# Patient Record
Sex: Female | Born: 1955 | Race: White | Hispanic: No | Marital: Married | Smoking: Never smoker
Health system: Southern US, Community
[De-identification: ages and names within clinical notes are randomized; demographics above are authoritative.]

## PROBLEM LIST (undated history)

## (undated) DIAGNOSIS — Z8719 Personal history of other diseases of the digestive system: Secondary | ICD-10-CM

## (undated) DIAGNOSIS — E119 Type 2 diabetes mellitus without complications: Secondary | ICD-10-CM

## (undated) DIAGNOSIS — G609 Hereditary and idiopathic neuropathy, unspecified: Secondary | ICD-10-CM

## (undated) DIAGNOSIS — I10 Essential (primary) hypertension: Secondary | ICD-10-CM

## (undated) DIAGNOSIS — D329 Benign neoplasm of meninges, unspecified: Secondary | ICD-10-CM

## (undated) DIAGNOSIS — F419 Anxiety disorder, unspecified: Secondary | ICD-10-CM

## (undated) DIAGNOSIS — I447 Left bundle-branch block, unspecified: Secondary | ICD-10-CM

## (undated) DIAGNOSIS — K219 Gastro-esophageal reflux disease without esophagitis: Secondary | ICD-10-CM

## (undated) DIAGNOSIS — C801 Malignant (primary) neoplasm, unspecified: Secondary | ICD-10-CM

## (undated) DIAGNOSIS — M1990 Unspecified osteoarthritis, unspecified site: Secondary | ICD-10-CM

## (undated) HISTORY — DX: Type 2 diabetes mellitus without complications: E11.9

## (undated) HISTORY — PX: ABDOMINAL SURGERY: SHX537

## (undated) HISTORY — PX: ABDOMINAL HYSTERECTOMY: SHX81

## (undated) HISTORY — PX: APPENDECTOMY: SHX54

## (undated) HISTORY — DX: Hereditary and idiopathic neuropathy, unspecified: G60.9

## (undated) HISTORY — DX: Essential (primary) hypertension: I10

---

## 2004-12-12 HISTORY — PX: KNEE SURGERY: SHX244

## 2006-12-12 HISTORY — PX: INGUINAL HERNIA REPAIR: SUR1180

## 2014-06-16 ENCOUNTER — Ambulatory Visit: Payer: Managed Care, Other (non HMO) | Attending: Gynecologic Oncology | Admitting: Gynecologic Oncology

## 2014-06-16 ENCOUNTER — Ambulatory Visit: Payer: Managed Care, Other (non HMO)

## 2014-06-16 ENCOUNTER — Encounter: Payer: Self-pay | Admitting: Gynecologic Oncology

## 2014-06-16 ENCOUNTER — Telehealth: Payer: Self-pay | Admitting: *Deleted

## 2014-06-16 VITALS — BP 119/67 | HR 127 | Temp 98.0°F | Resp 20 | Ht 63.0 in | Wt 156.6 lb

## 2014-06-16 DIAGNOSIS — R971 Elevated cancer antigen 125 [CA 125]: Secondary | ICD-10-CM

## 2014-06-16 DIAGNOSIS — E119 Type 2 diabetes mellitus without complications: Secondary | ICD-10-CM | POA: Insufficient documentation

## 2014-06-16 DIAGNOSIS — R1900 Intra-abdominal and pelvic swelling, mass and lump, unspecified site: Secondary | ICD-10-CM

## 2014-06-16 DIAGNOSIS — I10 Essential (primary) hypertension: Secondary | ICD-10-CM | POA: Insufficient documentation

## 2014-06-16 NOTE — Telephone Encounter (Signed)
Per pt she had left [facility] [place] Block at age 58, she does not have a cardiologist, advised she is seen by Dr. Ernestene Kiel at [facility] [phone] for cardiac clearance. Lm with Collie Siad who advised she will give message to MD

## 2014-06-16 NOTE — Progress Notes (Signed)
Consult Note: Gyn-Onc  Consult was requested by Dr. Marvel Plan for the evaluation of Kristin Meadows 58 y.o. female  CC:  Chief Complaint  Patient presents with  . Pelvic Pain  . Urinary Retention  . Mass    Assessment/Plan:  Kristin Meadows  is a 58 y.o.  year old woman with an elevated CA 125 and a complex cystic and solid adnexal mass, likely arising from the right ovary (she has a history of a supracervical hysterectomy and LSO for endometriosis at age 57.  I discussed with Kristin Meadows that I recommend the intact removal of this mass to make a definitive diagnosis regarding benign vs malignant pathology. I discussed that due to its size ( 15cm in largest dimension), I am recommending ex lap as an approach in order to maximize liklihood of an intact removal. We would send the specimen for frozen section pathology to evaluate for malignancy intraop, and if found, additional staging would be performed as indicated. She has a mildly elevated CEA (within normal limits but high for a non-smoker) and some stranding and prominence of the pancreatic tail, and therefore we will order a CA 19-9 preop. If elevated, we will order an MRI of the pancreas.  I discussed that because the mass is in close approximation with the rectum on RV exam, there is a possibility for rectal resection (or other GI resection/reanastamosis) and we discussed the possibility of colostomy/ileostomy and surgical risk associated with this. We also discussed the risks of surgery including  bleeding, infection, damage to internal organs (such as bladder,ureters, bowels), blood clot, reoperation and rehospitalization. She will hold her ASA preoperatively to reduce the risk of bleeding.  Kristin Meadows is eager to proceed as soon as possible with the scheduled exploratory laparotomy, RSO, possible staging, and we have scheduled her at our soonest available spot, however, we will also enquire if earlier surgery is available at [facility] in [place] at her request.   HPI: Kristin Meadows is a 58 year old woman (P3) who is seen in consultation at the request of Dr Marvel Plan for a complex cystic 15cmx 8cm x11cm pelvic mass adjacent to the right ovary. She has an elevated CA 125 (152 on 06/11/14) and  Her CEA is 3.28. This mass was found after the patient has several month history of suprapubic discomfort and negative urine cultures and failed empiric treatmetn for UTI's (with bactrim and cipro). She had imaging of the abdomen and pelvis 2 years ago that was negative for abnormalities. She has a remote history of a TAH and LSO for endometriosis at age 42.  CT of the abdomen adn pelvis (without contrast) on 06/09/14 as part of workup of this pain showed the 15cm complex cystic and solid mass, but no other symptoms concerning for metastatic ovarian cancer (no adenopathy, no ascites, no omental or peritoneal disease). There was apparent pancreatic tail "thickening" and stranding which was felt to be likely to be from vascular anatomy. There was no discrete pancreatic mass seen. She also reports a 10lb weight loss in 2 weeks due to poor appetite, early satiety and bloating. She reports the pain is constant, not made worse with micturition, and made somewhat better with narcotics. She reports worsening of constiptation but no hematochezia or change in calibre of stool. Her last colonoscopy was >5 years ago but <10years ago.  Interval History: She continues to feel discomfort and nausea since the diagnosis of this mass  Current Meds:  Outpatient Encounter Prescriptions as of 06/16/2014  Medication Sig  . aspirin 81 MG tablet Take 81 mg by mouth daily.  . baclofen (LIORESAL) 10 MG tablet Take 10 mg by mouth 3 (three) times daily.  Marland Kitchen FLUoxetine (PROZAC) 20 MG capsule Take 20 mg by mouth daily.  . fluticasone (VERAMYST) 27.5 MCG/SPRAY nasal spray Place 2 sprays into the nose daily.  . furosemide (LASIX) 20 MG tablet Take 20 mg by mouth.  . losartan (COZAAR)  100 MG tablet Take 100 mg by mouth daily.  . metFORMIN (GLUCOPHAGE-XR) 500 MG 24 hr tablet Take 500 mg by mouth daily with breakfast.  . montelukast (SINGULAIR) 10 MG tablet Take 10 mg by mouth at bedtime.  . Multiple Vitamin (MULTIVITAMIN) tablet Take 1 tablet by mouth daily.  Marland Kitchen oxyCODONE-acetaminophen (PERCOCET/ROXICET) 5-325 MG per tablet Take by mouth every 4 (four) hours as needed for severe pain.    Allergy:  Allergies  Allergen Reactions  . Penicillins     "Paralyzed me"  . Sulfa Antibiotics     Rash    Social Hx:   History   Social History  . Marital Status: Married    Spouse Name: N/A    Number of Children: N/A  . Years of Education: N/A   Occupational History  . Not on file.   Social History Main Topics  . Smoking status: Never Smoker   . Smokeless tobacco: Not on file  . Alcohol Use: No  . Drug Use: Not on file  . Sexual Activity: Yes   Other Topics Concern  . Not on file   Social History Narrative  . No narrative on file    Past Surgical Hx:  Past Surgical History  Procedure Laterality Date  . Abdominal hysterectomy      with left salpingo-oophorectomy, performed via laparotomy for edometriosis at age 62  . Inguinal hernia repair Bilateral     with m    Past Medical Hx:  Past Medical History  Diagnosis Date  . Hypertension   . Diabetes mellitus without complication   . Idiopathic neuropathy     Past Gynecological History:  LMP at time of hyst at age 73  No LMP recorded.  Family Hx:  Family History  Problem Relation Age of Onset  . Anesthesia problems Cousin     colon    Review of Systems:  Constitutional  Feels generally unwell with nausea and bloating  ENT Normal appearing ears and nares bilaterally Skin/Breast  No rash, sores, jaundice, itching, dryness Cardiovascular  No chest pain, shortness of breath, or edema  Pulmonary  No cough or wheeze.  Gastro Intestinal  + nausea, no vomitting or diarrhoea. No bright red blood  per rectum, no abdominal pain, change in bowel movement, + constipation.  Genito Urinary  No frequency, urgency, dysuria, + suprapubic pain Musculo Skeletal  No myalgia, arthralgia, joint swelling or pain  Neurologic  No weakness, numbness, change in gait,  Psychology  No depression, anxiety, insomnia.   Vitals:  Blood pressure 119/67, pulse 127, temperature 98 F (36.7 C), temperature source Oral, resp. rate 20, height 5\' 3"  (1.6 m), weight 156 lb 9.6 oz (71.033 kg).  Physical Exam: WD in NAD Neck  Supple NROM, without any enlargements.  Lymph Node Survey No cervical supraclavicular or inguinal adenopathy Cardiovascular  Pulse normal rate, regularity and rhythm. S1 and S2 normal.  Lungs  Clear to auscultation bilateraly, without wheezes/crackles/rhonchi. Good air movement.  Skin  No rash/lesions/breakdown  Psychiatry  Alert and oriented to person, place, and time  Abdomen  Normoactive bowel sounds, abdomen soft, non-tender and obese without evidence of hernia. Mass not discretely palpable abdominally. There is a well healed vertical midline incision. Back No CVA tenderness Genito Urinary  Vulva/vagina: Normal external female genitalia.  No lesions. No discharge or bleeding.  Bladder/urethra:  No lesions or masses, well supported bladder  Vagina: normal in appearance.  Cervix: Partially surgically absent, but with no visible abnormalities.  Uterus: surgically absent   Adnexa: large smooth pelvic mass filling pelvis in midline, and adherent to vagina. In close approximation with rectum, but no RV septal or cul de sac nodularity. Minimal mobility. Rectal  Good tone, no mucosal masses no cul de sac nodularity.  Extremities  No bilateral cyanosis, clubbing or edema.   @[facility] @ 06/16/2014, 3:41 PM

## 2014-06-16 NOTE — Patient Instructions (Signed)
Your Surgery is scheduled for July 01, 2014 with Dr. Everitt Amber    Unilateral Salpingo-Oophorectomy Unilateral salpingo-oophorectomy is the surgical removal of one fallopian tube and ovary. The ovaries are small organs that produce eggs in women. The fallopian tubes transport the egg from the ovary to the womb (uterus). A unilateral salpingo-oophorectomy may be done for various reasons, including:  Infection in the fallopian tube and ovary.  Scar tissue in the fallopian tube and ovary (adhesions).  A cyst or tumor on the ovary.  A need to remove the fallopian tube and ovary when removing the uterus.  Cancer of the fallopian tube or ovary. The removal of one fallopian tube and ovary will not prevent you from becoming pregnant, put you into menopause, or cause problems with your menstrual periods or sex drive. LET [facility] CARE PROVIDER KNOW ABOUT:  Any allergies you have.  All medicines you are taking, including vitamins, herbs, eye drops, creams, and over-the-counter medicines.  Previous problems you or members of your family have had with the use of anesthetics.  Any blood disorders you have.  Previous surgeries you have had.  Medical conditions you have. RISKS AND COMPLICATIONS  Generally, this is a safe procedure. However, as with any procedure, complications can occur. Possible complications include:  Injury to surrounding organs.  Bleeding.  Infection.  Blood clots in the legs or lungs.  Problems related to anesthesia. BEFORE THE PROCEDURE  Ask your health care provider about changing or stopping your regular medicines. You may need to stop taking certain medicines, such as aspirin or blood thinners, at least 1 week before the surgery.  Do not eat or drink anything for at least 8 hours before the surgery.  If you smoke, do not smoke for at least 2 weeks before the surgery.  Make plans to have someone drive you home after the procedure or after your hospital  stay. Also arrange for someone to help you with activities during recovery. PROCEDURE  You will be given medicine to help you relax before the procedure (sedative). You will then be given medicine to make you sleep through the procedure (general anesthetic). These medicines will be given through an IV access tube that is put into one of your veins.  Once you are asleep, your lower abdomen will be shaved and cleaned. A thin, flexible tube (catheter) will be placed in your bladder.  The surgeon may use a laparoscopic, robotic, or open technique for this surgery:  In the laparoscopic technique, the surgery is done through two small cuts (incisions) in the abdomen. A thin, lighted tube with a tiny camera on the end (laparoscope) is inserted into one of the incisions. The tools needed for the procedure are put through the other incision.  A robotic technique may be chosen to perform complex surgery in a small space. In the robotic technique, small incisions are made. A camera and surgical instruments are passed through the incisions. Surgical instruments are controlled with the help of a robotic arm.  In the open technique, the surgery is done through one large incision in the abdomen.  Using any of these techniques, the surgeon will remove the fallopian tube and ovary. The blood vessels will be clamped and tied.  The surgeon will then use staples or stitches to close the incision or incisions. AFTER THE PROCEDURE  You will be taken to a recovery area where your progress will be monitored for 1-3 hours. Your blood pressure, pulse, and temperature will be checked  often. You will remain in the recovery area until you are stable and waking up.  If the laparoscopic technique was used, you may be allowed to go home after several hours. You may have some shoulder pain. This is normal and usually goes away in a day or two.  If the open technique was used, you will be admitted to the hospital for a couple  of days.  You will be given pain medicine as necessary.  The IV tube and catheter will be removed before you are discharged. Document Released: 09/25/2009 Document Revised: 12/03/2013 Document Reviewed: 05/22/2013 [facility] Patient Information 2015 [place], [place]. This information is not intended to replace advice given to you by your health care provider. Make sure you discuss any questions you have with your health care provider.

## 2014-06-17 ENCOUNTER — Encounter (HOSPITAL_COMMUNITY): Payer: Self-pay | Admitting: Pharmacy Technician

## 2014-06-17 ENCOUNTER — Telehealth: Payer: Self-pay | Admitting: *Deleted

## 2014-06-17 ENCOUNTER — Other Ambulatory Visit: Payer: Self-pay | Admitting: Gynecologic Oncology

## 2014-06-17 DIAGNOSIS — R978 Other abnormal tumor markers: Secondary | ICD-10-CM

## 2014-06-17 DIAGNOSIS — K8689 Other specified diseases of pancreas: Secondary | ICD-10-CM

## 2014-06-17 LAB — CANCER ANTIGEN 19-9: CA 19-9: 224.7 U/mL — ABNORMAL HIGH (ref <35.0)

## 2014-06-17 NOTE — Telephone Encounter (Signed)
Called pt  Notified MRI scheduled for  July 16 at 845pm NPO after 445pm. Gave pt address [place]. Discussed reason form MRI, and results may change surgical plan. Our office will call pt with MRI results. Pt verbalized understanding. No further concerns.

## 2014-06-17 NOTE — Progress Notes (Signed)
Dr Rossi/ Armanda Magic-  PLEASE ADD PRE OP ORDERS FOR PST VISIT WHEN YOU CAN   THANKS

## 2014-06-17 NOTE — Telephone Encounter (Signed)
Message copied by Felicita Nuncio, Aletha Halim on Tue Jun 17, 2014  3:29 PM ------      Message from: Thereasa Solo      Created: Tue Jun 17, 2014  7:24 AM       This is a high value of CA 19-9 and higher than I would expect for a primary ovarian process (it is proportionately higher than her CA 125). Therefore Kristin Meadows requires an MRI of the pancreas in order to better evaluate the prominent pancreatic tail seen on non-contrasted CT. This should be done prior to her surgery with me as it might change our operative plan.            Stanton Kidney, would you mind calling the patient with the result and assisting in scheduling her MRI. This order is in [place].      Thank you.      Donaciano Eva, MD       ------

## 2014-06-24 ENCOUNTER — Other Ambulatory Visit: Payer: Self-pay | Admitting: *Deleted

## 2014-06-24 DIAGNOSIS — R1900 Intra-abdominal and pelvic swelling, mass and lump, unspecified site: Secondary | ICD-10-CM

## 2014-06-25 ENCOUNTER — Encounter (HOSPITAL_COMMUNITY): Payer: Self-pay

## 2014-06-25 ENCOUNTER — Encounter (HOSPITAL_COMMUNITY)
Admission: RE | Admit: 2014-06-25 | Discharge: 2014-06-25 | Disposition: A | Discharge status: Home / Self Care | Payer: Managed Care, Other (non HMO) | Source: Ambulatory Visit | Attending: Gynecologic Oncology | Admitting: Gynecologic Oncology

## 2014-06-25 ENCOUNTER — Ambulatory Visit (HOSPITAL_COMMUNITY)
Admission: RE | Admit: 2014-06-25 | Discharge: 2014-06-25 | Disposition: A | Discharge status: Home / Self Care | Payer: Managed Care, Other (non HMO) | Source: Ambulatory Visit | Attending: Anesthesiology | Admitting: Anesthesiology

## 2014-06-25 DIAGNOSIS — Z01810 Encounter for preprocedural cardiovascular examination: Secondary | ICD-10-CM | POA: Insufficient documentation

## 2014-06-25 DIAGNOSIS — Z01812 Encounter for preprocedural laboratory examination: Secondary | ICD-10-CM | POA: Insufficient documentation

## 2014-06-25 DIAGNOSIS — Z01818 Encounter for other preprocedural examination: Secondary | ICD-10-CM | POA: Insufficient documentation

## 2014-06-25 HISTORY — DX: Unspecified osteoarthritis, unspecified site: M19.90

## 2014-06-25 HISTORY — DX: Anxiety disorder, unspecified: F41.9

## 2014-06-25 HISTORY — DX: Personal history of other diseases of the digestive system: Z87.19

## 2014-06-25 HISTORY — DX: Benign neoplasm of meninges, unspecified: D32.9

## 2014-06-25 HISTORY — DX: Gastro-esophageal reflux disease without esophagitis: K21.9

## 2014-06-25 HISTORY — DX: Left bundle-branch block, unspecified: I44.7

## 2014-06-25 LAB — CBC WITH DIFFERENTIAL/PLATELET
Basophils Absolute: 0 10*3/uL (ref 0.0–0.1)
Basophils Relative: 0 % (ref 0–1)
EOS ABS: 0.1 10*3/uL (ref 0.0–0.7)
Eosinophils Relative: 1 % (ref 0–5)
HCT: 36.3 % (ref 36.0–46.0)
Hemoglobin: 12.2 g/dL (ref 12.0–15.0)
LYMPHS ABS: 2 10*3/uL (ref 0.7–4.0)
LYMPHS PCT: 23 % (ref 12–46)
MCH: 28.6 pg (ref 26.0–34.0)
MCHC: 33.6 g/dL (ref 30.0–36.0)
MCV: 85 fL (ref 78.0–100.0)
Monocytes Absolute: 0.6 10*3/uL (ref 0.1–1.0)
Monocytes Relative: 7 % (ref 3–12)
NEUTROS PCT: 69 % (ref 43–77)
Neutro Abs: 5.8 10*3/uL (ref 1.7–7.7)
Platelets: 212 10*3/uL (ref 150–400)
RBC: 4.27 MIL/uL (ref 3.87–5.11)
RDW: 13.4 % (ref 11.5–15.5)
WBC: 8.5 10*3/uL (ref 4.0–10.5)

## 2014-06-25 LAB — COMPREHENSIVE METABOLIC PANEL
ALT: 12 U/L (ref 0–35)
AST: 12 U/L (ref 0–37)
Albumin: 3.4 g/dL — ABNORMAL LOW (ref 3.5–5.2)
Alkaline Phosphatase: 77 U/L (ref 39–117)
Anion gap: 14 (ref 5–15)
BUN: 9 mg/dL (ref 6–23)
CHLORIDE: 97 meq/L (ref 96–112)
CO2: 26 meq/L (ref 19–32)
Calcium: 9.5 mg/dL (ref 8.4–10.5)
Creatinine, Ser: 0.61 mg/dL (ref 0.50–1.10)
GFR calc non Af Amer: >90 mL/min (ref >90)
GLUCOSE: 217 mg/dL — AB (ref 70–99)
Potassium: 3.6 mEq/L — ABNORMAL LOW (ref 3.7–5.3)
SODIUM: 137 meq/L (ref 137–147)
TOTAL PROTEIN: 6.9 g/dL (ref 6.0–8.3)
Total Bilirubin: 0.6 mg/dL (ref 0.3–1.2)

## 2014-06-25 LAB — URINALYSIS, ROUTINE W REFLEX MICROSCOPIC
BILIRUBIN URINE: NEGATIVE
Glucose, UA: NEGATIVE mg/dL
HGB URINE DIPSTICK: NEGATIVE
Ketones, ur: NEGATIVE mg/dL
Leukocytes, UA: NEGATIVE
Nitrite: NEGATIVE
Protein, ur: NEGATIVE mg/dL
SPECIFIC GRAVITY, URINE: 1.009 (ref 1.005–1.030)
Urobilinogen, UA: 0.2 mg/dL (ref 0.0–1.0)
pH: 6 (ref 5.0–8.0)

## 2014-06-25 NOTE — Patient Instructions (Addendum)
Kristin Meadows  06/25/2014   Your procedure is scheduled on: Tuesday 07/01/14  Report to [facility] at 08:45 AM.  Call this number if you have problems the morning of surgery [phone]: [phone]   Remember:  Please follow bowel prep instructions   Clear liquids all day Monday 06/30/14. Do not eat food or drink liquids After Midnight.     Take these medicines the morning of surgery with A SIP OF WATER: flonase if needed, oxycodone if needed, ativan if needed, omeprazole   Do not wear jewelry, make-up or nail polish.  Do not wear lotions, powders, or perfumes. You may wear deodorant.  Do not shave 48 hours prior to surgery. Men may shave face and neck.  Do not bring valuables to the hospital.  Contacts, dentures or bridgework may not be worn into surgery.  Leave suitcase in the car. After surgery it may be brought to your room.  For patients admitted to the hospital, checkout time is 11:00 AM the day of discharge.    Paulette Blanch, RN  pre op nurse call if needed [phone]    [facility] - Preparing for Surgery Before surgery, you can play an important role.  Because skin is not sterile, your skin needs to be as free of germs as possible.  You can reduce the number of germs on your skin by washing with CHG (chlorahexidine gluconate) soap before surgery.  CHG is an antiseptic cleaner which kills germs and bonds with the skin to continue killing germs even after washing. Please DO NOT use if you have an allergy to CHG or antibacterial soaps.  If your skin becomes reddened/irritated stop using the CHG and inform your nurse when you arrive at Short Stay. Do not shave (including legs and underarms) for at least 48 hours prior to the first CHG shower.  You may shave your face/neck. Please follow these instructions carefully:  1.  Shower with CHG Soap the night before surgery and the  morning of Surgery.  2.  If you choose to wash your hair, wash your hair first as usual with  your  normal  shampoo.  3.  After you shampoo, rinse your hair and body thoroughly to remove the  shampoo.                            4.  Use CHG as you would any other liquid soap.  You can apply chg directly  to the skin and wash                       Gently with a scrungie or clean washcloth.  5.  Apply the CHG Soap to your body ONLY FROM THE NECK DOWN.   Do not use on face/ open                           Wound or open sores. Avoid contact with eyes, ears mouth and genitals (private parts).                       Wash face,  Genitals (private parts) with your normal soap.             6.  Wash thoroughly, paying special attention to the area where your surgery  will be performed.  7.  Thoroughly rinse your body with warm water  from the neck down.  8.  DO NOT shower/wash with your normal soap after using and rinsing off  the CHG Soap.                9.  Pat yourself dry with a clean towel.            10.  Wear clean pajamas.            11.  Place clean sheets on your bed the night of your first shower and do not  sleep with pets. Day of Surgery : Do not apply any lotions/deodorants the morning of surgery.  Please wear clean clothes to the hospital/surgery center.  FAILURE TO FOLLOW THESE INSTRUCTIONS MAY RESULT IN THE CANCELLATION OF YOUR SURGERY PATIENT SIGNATURE_________________________________  NURSE SIGNATURE__________________________________  ________________________________________________________________________    CLEAR LIQUID DIET   Foods Allowed                                                                     Foods Excluded  Coffee and tea, regular and decaf                             liquids that you cannot  Plain Jell-O in any flavor                                             see through such as: Fruit ices (not with fruit pulp)                                     milk, soups, orange juice  Iced Popsicles                                    All solid food Carbonated  beverages, regular and diet                                    Cranberry, grape and apple juices Sports drinks like Gatorade Lightly seasoned clear broth or consume(fat free) Sugar, honey syrup  Sample Menu Breakfast                                Lunch                                     Supper Cranberry juice                    Beef broth                            Chicken broth Jell-O  Grape juice                           Apple juice Coffee or tea                        Jell-O                                      Popsicle                                                Coffee or tea                        Coffee or tea  _____________________________________________________________________   WHAT IS A BLOOD TRANSFUSION? Blood Transfusion Information  A transfusion is the replacement of blood or some of its parts. Blood is made up of multiple cells which provide different functions.  Red blood cells carry oxygen and are used for blood loss replacement.  White blood cells fight against infection.  Platelets control bleeding.  Plasma helps clot blood.  Other blood products are available for specialized needs, such as hemophilia or other clotting disorders. BEFORE THE TRANSFUSION  Who gives blood for transfusions?   Healthy volunteers who are fully evaluated to make sure their blood is safe. This is blood bank blood. Transfusion therapy is the safest it has ever been in the practice of medicine. Before blood is taken from a donor, a complete history is taken to make sure that person has no history of diseases nor engages in risky social behavior (examples are intravenous drug use or sexual activity with multiple partners). The donor's travel history is screened to minimize risk of transmitting infections, such as malaria. The donated blood is tested for signs of infectious diseases, such as HIV and hepatitis. The blood is then tested to be sure it is  compatible with you in order to minimize the chance of a transfusion reaction. If you or a relative donates blood, this is often done in anticipation of surgery and is not appropriate for emergency situations. It takes many days to process the donated blood. RISKS AND COMPLICATIONS Although transfusion therapy is very safe and saves many lives, the main dangers of transfusion include:   Getting an infectious disease.  Developing a transfusion reaction. This is an allergic reaction to something in the blood you were given. Every precaution is taken to prevent this. The decision to have a blood transfusion has been considered carefully by your caregiver before blood is given. Blood is not given unless the benefits outweigh the risks. AFTER THE TRANSFUSION  Right after receiving a blood transfusion, you will usually feel much better and more energetic. This is especially true if your red blood cells have gotten low (anemic). The transfusion raises the level of the red blood cells which carry oxygen, and this usually causes an energy increase.  The nurse administering the transfusion will monitor you carefully for complications. HOME CARE INSTRUCTIONS  No special instructions are needed after a transfusion. You may find your energy is better. Speak with your caregiver about any limitations on activity for underlying diseases you may have. SEEK MEDICAL CARE IF:  Your condition is not improving after your transfusion.  You develop redness or irritation at the intravenous (IV) site. SEEK IMMEDIATE MEDICAL CARE IF:  Any of the following symptoms occur over the next 12 hours:  Shaking chills.  You have a temperature by mouth above 102 F (38.9 C), not controlled by medicine.  Chest, back, or muscle pain.  People around you feel you are not acting correctly or are confused.  Shortness of breath or difficulty breathing.  Dizziness and fainting.  You get a rash or develop hives.  You have  a decrease in urine output.  Your urine turns a dark color or changes to pink, red, or brown. Any of the following symptoms occur over the next 10 days:  You have a temperature by mouth above 102 F (38.9 C), not controlled by medicine.  Shortness of breath.  Weakness after normal activity.  The white part of the eye turns yellow (jaundice).  You have a decrease in the amount of urine or are urinating less often.  Your urine turns a dark color or changes to pink, red, or brown. Document Released: 11/25/2000 Document Revised: 02/20/2012 Document Reviewed: 07/14/2008 ExitCare Patient Information 2014 [place].  _______________________________________________________________________  Incentive Spirometer  An incentive spirometer is a tool that can help keep your lungs clear and active. This tool measures how well you are filling your lungs with each breath. Taking long deep breaths may help reverse or decrease the chance of developing breathing (pulmonary) problems (especially infection) following:  A long period of time when you are unable to move or be active. BEFORE THE PROCEDURE   If the spirometer includes an indicator to show your best effort, your nurse or respiratory therapist will set it to a desired goal.  If possible, sit up straight or lean slightly forward. Try not to slouch.  Hold the incentive spirometer in an upright position. INSTRUCTIONS FOR USE  1. Sit on the edge of your bed if possible, or sit up as far as you can in bed or on a chair. 2. Hold the incentive spirometer in an upright position. 3. Breathe out normally. 4. Place the mouthpiece in your mouth and seal your lips tightly around it. 5. Breathe in slowly and as deeply as possible, raising the piston or the ball toward the top of the column. 6. Hold your breath for 3-5 seconds or for as long as possible. Allow the piston or ball to fall to the bottom of the column. 7. Remove the mouthpiece from  your mouth and breathe out normally. 8. Rest for a few seconds and repeat Steps 1 through 7 at least 10 times every 1-2 hours when you are awake. Take your time and take a few normal breaths between deep breaths. 9. The spirometer may include an indicator to show your best effort. Use the indicator as a goal to work toward during each repetition. 10. After each set of 10 deep breaths, practice coughing to be sure your lungs are clear. If you have an incision (the cut made at the time of surgery), support your incision when coughing by placing a pillow or rolled up towels firmly against it. Once you are able to get out of bed, walk around indoors and cough well. You may stop using the incentive spirometer when instructed by your caregiver.  RISKS AND COMPLICATIONS  Take your time so you do not get dizzy or light-headed.  If you are in pain, you may need to take or ask  for pain medication before doing incentive spirometry. It is harder to take a deep breath if you are having pain. AFTER USE  Rest and breathe slowly and easily.  It can be helpful to keep track of a log of your progress. Your caregiver can provide you with a simple table to help with this. If you are using the spirometer at home, follow these instructions: [place] IF:   You are having difficultly using the spirometer.  You have trouble using the spirometer as often as instructed.  Your pain medication is not giving enough relief while using the spirometer.  You develop fever of 100.5 F (38.1 C) or higher. SEEK IMMEDIATE MEDICAL CARE IF:   You cough up bloody sputum that had not been present before.  You develop fever of 102 F (38.9 C) or greater.  You develop worsening pain at or near the incision site. MAKE SURE YOU:   Understand these instructions.  Will watch your condition.  Will get help right away if you are not doing well or get worse. Document Released: 04/10/2007 Document Revised: 02/20/2012  Document Reviewed: 06/11/2007 [facility] Patient Information 2014 [place], [place].   ________________________________________________________________________

## 2014-06-26 ENCOUNTER — Other Ambulatory Visit: Payer: Self-pay | Admitting: Gynecologic Oncology

## 2014-06-26 ENCOUNTER — Ambulatory Visit (HOSPITAL_COMMUNITY)
Admission: RE | Admit: 2014-06-26 | Discharge: 2014-06-26 | Disposition: A | Discharge status: Home / Self Care | Payer: Managed Care, Other (non HMO) | Source: Ambulatory Visit | Attending: Gynecologic Oncology | Admitting: Gynecologic Oncology

## 2014-06-26 DIAGNOSIS — R935 Abnormal findings on diagnostic imaging of other abdominal regions, including retroperitoneum: Secondary | ICD-10-CM | POA: Insufficient documentation

## 2014-06-26 DIAGNOSIS — R978 Other abnormal tumor markers: Secondary | ICD-10-CM

## 2014-06-26 DIAGNOSIS — K7689 Other specified diseases of liver: Secondary | ICD-10-CM | POA: Insufficient documentation

## 2014-06-26 DIAGNOSIS — R188 Other ascites: Secondary | ICD-10-CM | POA: Insufficient documentation

## 2014-06-26 DIAGNOSIS — K869 Disease of pancreas, unspecified: Secondary | ICD-10-CM | POA: Insufficient documentation

## 2014-06-26 DIAGNOSIS — K8689 Other specified diseases of pancreas: Secondary | ICD-10-CM

## 2014-06-26 DIAGNOSIS — N839 Noninflammatory disorder of ovary, fallopian tube and broad ligament, unspecified: Secondary | ICD-10-CM | POA: Insufficient documentation

## 2014-06-26 DIAGNOSIS — D7389 Other diseases of spleen: Secondary | ICD-10-CM | POA: Insufficient documentation

## 2014-06-26 MED ORDER — GADOBENATE DIMEGLUMINE 529 MG/ML IV SOLN
15.0000 mL | Freq: Once | INTRAVENOUS | Status: AC | PRN
  Administered 2014-06-26: 15 mL via INTRAVENOUS

## 2014-06-26 NOTE — Progress Notes (Signed)
ekg reviewed by Dr Landry Dyke

## 2014-07-01 ENCOUNTER — Other Ambulatory Visit: Payer: Self-pay | Admitting: *Deleted

## 2014-07-01 ENCOUNTER — Encounter (HOSPITAL_COMMUNITY): Payer: Self-pay | Admitting: *Deleted

## 2014-07-01 ENCOUNTER — Encounter (HOSPITAL_COMMUNITY): Payer: Managed Care, Other (non HMO) | Admitting: Anesthesiology

## 2014-07-01 ENCOUNTER — Encounter (HOSPITAL_COMMUNITY)
Admission: RE | Disposition: A | Discharge status: Home / Self Care | Payer: Self-pay | Source: Ambulatory Visit | Attending: Gynecologic Oncology

## 2014-07-01 ENCOUNTER — Inpatient Hospital Stay (HOSPITAL_COMMUNITY)
Admission: RE | Admit: 2014-07-01 | Discharge: 2014-07-03 | DRG: 737 | Disposition: A | Discharge status: Home / Self Care | Payer: Managed Care, Other (non HMO) | Source: Ambulatory Visit | Attending: Gynecologic Oncology | Admitting: Gynecologic Oncology

## 2014-07-01 ENCOUNTER — Inpatient Hospital Stay (HOSPITAL_COMMUNITY): Payer: Managed Care, Other (non HMO) | Admitting: Anesthesiology

## 2014-07-01 DIAGNOSIS — R188 Other ascites: Secondary | ICD-10-CM | POA: Diagnosis present

## 2014-07-01 DIAGNOSIS — I10 Essential (primary) hypertension: Secondary | ICD-10-CM | POA: Diagnosis present

## 2014-07-01 DIAGNOSIS — Z7982 Long term (current) use of aspirin: Secondary | ICD-10-CM

## 2014-07-01 DIAGNOSIS — R1900 Intra-abdominal and pelvic swelling, mass and lump, unspecified site: Secondary | ICD-10-CM

## 2014-07-01 DIAGNOSIS — C561 Malignant neoplasm of right ovary: Secondary | ICD-10-CM

## 2014-07-01 DIAGNOSIS — C569 Malignant neoplasm of unspecified ovary: Secondary | ICD-10-CM

## 2014-07-01 DIAGNOSIS — R339 Retention of urine, unspecified: Secondary | ICD-10-CM | POA: Diagnosis present

## 2014-07-01 DIAGNOSIS — Z881 Allergy status to other antibiotic agents status: Secondary | ICD-10-CM

## 2014-07-01 DIAGNOSIS — Z79899 Other long term (current) drug therapy: Secondary | ICD-10-CM

## 2014-07-01 DIAGNOSIS — E1142 Type 2 diabetes mellitus with diabetic polyneuropathy: Secondary | ICD-10-CM | POA: Diagnosis present

## 2014-07-01 DIAGNOSIS — E1149 Type 2 diabetes mellitus with other diabetic neurological complication: Secondary | ICD-10-CM | POA: Diagnosis present

## 2014-07-01 DIAGNOSIS — Z880 Allergy status to penicillin: Secondary | ICD-10-CM

## 2014-07-01 HISTORY — PX: SALPINGOOPHORECTOMY: SHX82

## 2014-07-01 LAB — GLUCOSE, CAPILLARY
GLUCOSE-CAPILLARY: 245 mg/dL — AB (ref 70–99)
Glucose-Capillary: 190 mg/dL — ABNORMAL HIGH (ref 70–99)
Glucose-Capillary: 166 mg/dL — ABNORMAL HIGH (ref 70–99)
Glucose-Capillary: 182 mg/dL — ABNORMAL HIGH (ref 70–99)

## 2014-07-01 LAB — TYPE AND SCREEN
ABO/RH(D): O POS
ANTIBODY SCREEN: NEGATIVE

## 2014-07-01 LAB — ABO/RH: ABO/RH(D): O POS

## 2014-07-01 SURGERY — SALPINGO-OOPHORECTOMY, OPEN
Anesthesia: General | Laterality: Right

## 2014-07-01 MED ORDER — METRONIDAZOLE IN NACL 5-0.79 MG/ML-% IV SOLN
INTRAVENOUS | Status: AC
  Filled 2014-07-01: qty 100

## 2014-07-01 MED ORDER — IBUPROFEN 600 MG PO TABS
600.0000 mg | ORAL_TABLET | Freq: Four times a day (QID) | ORAL | Status: DC
  Administered 2014-07-01 – 2014-07-03 (×7): 600 mg via ORAL
  Filled 2014-07-01 (×10): qty 1

## 2014-07-01 MED ORDER — GLYCOPYRROLATE 0.2 MG/ML IJ SOLN
INTRAMUSCULAR | Status: DC | PRN
  Administered 2014-07-01: 0.6 mg via INTRAVENOUS

## 2014-07-01 MED ORDER — INSULIN ASPART 100 UNIT/ML ~~LOC~~ SOLN
SUBCUTANEOUS | Status: AC
  Filled 2014-07-01: qty 1

## 2014-07-01 MED ORDER — HYDROMORPHONE HCL PF 1 MG/ML IJ SOLN
0.5000 mg | INTRAMUSCULAR | Status: DC | PRN
  Administered 2014-07-01 – 2014-07-02 (×3): 0.5 mg via INTRAVENOUS
  Filled 2014-07-01 (×3): qty 1

## 2014-07-01 MED ORDER — KCL IN DEXTROSE-NACL 20-5-0.45 MEQ/L-%-% IV SOLN
INTRAVENOUS | Status: DC
  Administered 2014-07-01 – 2014-07-02 (×2): via INTRAVENOUS
  Filled 2014-07-01 (×4): qty 1000

## 2014-07-01 MED ORDER — NEOSTIGMINE METHYLSULFATE 10 MG/10ML IV SOLN
INTRAVENOUS | Status: DC | PRN
  Administered 2014-07-01: 4 mg via INTRAVENOUS

## 2014-07-01 MED ORDER — LOSARTAN POTASSIUM 50 MG PO TABS
100.0000 mg | ORAL_TABLET | Freq: Every morning | ORAL | Status: DC
  Administered 2014-07-02: 100 mg via ORAL
  Filled 2014-07-01 (×2): qty 2

## 2014-07-01 MED ORDER — PROMETHAZINE HCL 25 MG/ML IJ SOLN: 6.2500 mg | INTRAMUSCULAR | Status: DC | PRN

## 2014-07-01 MED ORDER — ONDANSETRON HCL 4 MG/2ML IJ SOLN
4.0000 mg | Freq: Four times a day (QID) | INTRAMUSCULAR | Status: DC | PRN
  Administered 2014-07-03: 4 mg via INTRAVENOUS
  Filled 2014-07-01: qty 2

## 2014-07-01 MED ORDER — 0.9 % SODIUM CHLORIDE (POUR BTL) OPTIME
TOPICAL | Status: DC | PRN
  Administered 2014-07-01: 2000 mL

## 2014-07-01 MED ORDER — ROCURONIUM BROMIDE 100 MG/10ML IV SOLN
INTRAVENOUS | Status: DC | PRN
  Administered 2014-07-01: 5 mg via INTRAVENOUS
  Administered 2014-07-01: 35 mg via INTRAVENOUS

## 2014-07-01 MED ORDER — ONDANSETRON HCL 4 MG/2ML IJ SOLN
INTRAMUSCULAR | Status: DC | PRN
  Administered 2014-07-01: 4 mg via INTRAVENOUS

## 2014-07-01 MED ORDER — BUPIVACAINE LIPOSOME 1.3 % IJ SUSP
INTRAMUSCULAR | Status: DC | PRN
  Administered 2014-07-01: 20 mL

## 2014-07-01 MED ORDER — LACTATED RINGERS IV SOLN
INTRAVENOUS | Status: DC
  Administered 2014-07-01 (×2): via INTRAVENOUS
  Administered 2014-07-01: 1000 mL via INTRAVENOUS

## 2014-07-01 MED ORDER — ASPIRIN 81 MG PO TABS: 81.0000 mg | ORAL_TABLET | Freq: Every day | ORAL | Status: DC

## 2014-07-01 MED ORDER — GLUCERNA SHAKE PO LIQD
237.0000 mL | Freq: Two times a day (BID) | ORAL | Status: DC
  Administered 2014-07-02 (×2): 237 mL via ORAL
  Filled 2014-07-01 (×5): qty 237

## 2014-07-01 MED ORDER — FENTANYL CITRATE 0.05 MG/ML IJ SOLN
INTRAMUSCULAR | Status: DC | PRN
  Administered 2014-07-01 (×3): 50 ug via INTRAVENOUS
  Administered 2014-07-01: 100 ug via INTRAVENOUS

## 2014-07-01 MED ORDER — INSULIN ASPART 100 UNIT/ML ~~LOC~~ SOLN
0.0000 [IU] | SUBCUTANEOUS | Status: DC
Start: 2014-07-01 — End: 2014-07-01
  Administered 2014-07-01: 3 [IU] via SUBCUTANEOUS

## 2014-07-01 MED ORDER — ENOXAPARIN SODIUM 40 MG/0.4ML ~~LOC~~ SOLN
40.0000 mg | SUBCUTANEOUS | Status: DC
  Administered 2014-07-02 – 2014-07-03 (×2): 40 mg via SUBCUTANEOUS
  Filled 2014-07-01 (×3): qty 0.4

## 2014-07-01 MED ORDER — HYDROMORPHONE HCL PF 1 MG/ML IJ SOLN
INTRAMUSCULAR | Status: AC
  Filled 2014-07-01: qty 1

## 2014-07-01 MED ORDER — LIDOCAINE HCL (CARDIAC) 20 MG/ML IV SOLN
INTRAVENOUS | Status: DC | PRN
  Administered 2014-07-01: 100 mg via INTRAVENOUS

## 2014-07-01 MED ORDER — PROPOFOL 10 MG/ML IV BOLUS
INTRAVENOUS | Status: DC | PRN
  Administered 2014-07-01: 150 mg via INTRAVENOUS

## 2014-07-01 MED ORDER — ONDANSETRON HCL 4 MG/2ML IJ SOLN
INTRAMUSCULAR | Status: AC
  Filled 2014-07-01: qty 2

## 2014-07-01 MED ORDER — ASPIRIN 81 MG PO CHEW
81.0000 mg | CHEWABLE_TABLET | Freq: Every day | ORAL | Status: DC
  Administered 2014-07-02 – 2014-07-03 (×2): 81 mg via ORAL
  Filled 2014-07-01 (×2): qty 1

## 2014-07-01 MED ORDER — BUPIVACAINE LIPOSOME 1.3 % IJ SUSP
20.0000 mL | Freq: Once | INTRAMUSCULAR | Status: DC
  Filled 2014-07-01: qty 20

## 2014-07-01 MED ORDER — GLYCOPYRROLATE 0.2 MG/ML IJ SOLN
INTRAMUSCULAR | Status: AC
  Filled 2014-07-01: qty 3

## 2014-07-01 MED ORDER — ACETAMINOPHEN 10 MG/ML IV SOLN
1000.0000 mg | Freq: Once | INTRAVENOUS | Status: AC
  Administered 2014-07-01: 1000 mg via INTRAVENOUS
  Filled 2014-07-01: qty 100

## 2014-07-01 MED ORDER — ONDANSETRON HCL 4 MG PO TABS: 4.0000 mg | ORAL_TABLET | Freq: Four times a day (QID) | ORAL | Status: DC | PRN

## 2014-07-01 MED ORDER — CIPROFLOXACIN IN D5W 400 MG/200ML IV SOLN
400.0000 mg | INTRAVENOUS | Status: AC
  Administered 2014-07-01: 400 mg via INTRAVENOUS

## 2014-07-01 MED ORDER — LIDOCAINE HCL (CARDIAC) 20 MG/ML IV SOLN
INTRAVENOUS | Status: AC
  Filled 2014-07-01: qty 5

## 2014-07-01 MED ORDER — FENTANYL CITRATE 0.05 MG/ML IJ SOLN
INTRAMUSCULAR | Status: AC
  Filled 2014-07-01: qty 5

## 2014-07-01 MED ORDER — NEOSTIGMINE METHYLSULFATE 10 MG/10ML IV SOLN
INTRAVENOUS | Status: AC
  Filled 2014-07-01: qty 1

## 2014-07-01 MED ORDER — PANTOPRAZOLE SODIUM 40 MG PO TBEC
40.0000 mg | DELAYED_RELEASE_TABLET | Freq: Every day | ORAL | Status: DC
  Administered 2014-07-01 – 2014-07-03 (×3): 40 mg via ORAL
  Filled 2014-07-01 (×3): qty 1

## 2014-07-01 MED ORDER — BACLOFEN 10 MG PO TABS
10.0000 mg | ORAL_TABLET | Freq: Three times a day (TID) | ORAL | Status: DC | PRN
  Filled 2014-07-01: qty 1

## 2014-07-01 MED ORDER — INSULIN ASPART 100 UNIT/ML ~~LOC~~ SOLN
0.0000 [IU] | Freq: Three times a day (TID) | SUBCUTANEOUS | Status: DC
  Administered 2014-07-01: 3 [IU] via SUBCUTANEOUS
  Administered 2014-07-02 (×3): 5 [IU] via SUBCUTANEOUS

## 2014-07-01 MED ORDER — LORAZEPAM 0.5 MG PO TABS
0.5000 mg | ORAL_TABLET | Freq: Three times a day (TID) | ORAL | Status: DC | PRN
  Administered 2014-07-02: 0.5 mg via ORAL
  Filled 2014-07-01: qty 1

## 2014-07-01 MED ORDER — METRONIDAZOLE IN NACL 5-0.79 MG/ML-% IV SOLN
500.0000 mg | INTRAVENOUS | Status: AC
  Administered 2014-07-01: 500 mg via INTRAVENOUS
  Filled 2014-07-01: qty 100

## 2014-07-01 MED ORDER — MAGNESIUM HYDROXIDE 400 MG/5ML PO SUSP
30.0000 mL | Freq: Three times a day (TID) | ORAL | Status: AC
  Administered 2014-07-01 – 2014-07-02 (×3): 30 mL via ORAL
  Filled 2014-07-01 (×3): qty 30

## 2014-07-01 MED ORDER — PROPOFOL 10 MG/ML IV BOLUS
INTRAVENOUS | Status: AC
  Filled 2014-07-01: qty 20

## 2014-07-01 MED ORDER — SUCCINYLCHOLINE CHLORIDE 20 MG/ML IJ SOLN
INTRAMUSCULAR | Status: DC | PRN
  Administered 2014-07-01: 80 mg via INTRAVENOUS

## 2014-07-01 MED ORDER — HEPARIN SODIUM (PORCINE) 1000 UNIT/ML IJ SOLN
INTRAMUSCULAR | Status: AC
  Filled 2014-07-01: qty 1

## 2014-07-01 MED ORDER — OXYCODONE HCL 5 MG PO TABS
10.0000 mg | ORAL_TABLET | Freq: Four times a day (QID) | ORAL | Status: DC | PRN
Start: 2014-07-01 — End: 2014-07-03
  Administered 2014-07-01 – 2014-07-02 (×3): 10 mg via ORAL
  Filled 2014-07-01 (×4): qty 2

## 2014-07-01 MED ORDER — FLUTICASONE PROPIONATE 50 MCG/ACT NA SUSP
1.0000 | Freq: Every day | NASAL | Status: DC
  Filled 2014-07-01: qty 16

## 2014-07-01 MED ORDER — ACETAMINOPHEN 500 MG PO TABS
1000.0000 mg | ORAL_TABLET | Freq: Four times a day (QID) | ORAL | Status: DC
  Administered 2014-07-01 – 2014-07-03 (×7): 1000 mg via ORAL
  Filled 2014-07-01 (×10): qty 2

## 2014-07-01 MED ORDER — ROCURONIUM BROMIDE 100 MG/10ML IV SOLN
INTRAVENOUS | Status: AC
  Filled 2014-07-01: qty 1

## 2014-07-01 MED ORDER — MIDAZOLAM HCL 2 MG/2ML IJ SOLN
INTRAMUSCULAR | Status: AC
  Filled 2014-07-01: qty 2

## 2014-07-01 MED ORDER — CIPROFLOXACIN IN D5W 400 MG/200ML IV SOLN
INTRAVENOUS | Status: AC
  Filled 2014-07-01: qty 200

## 2014-07-01 MED ORDER — MIDAZOLAM HCL 5 MG/5ML IJ SOLN
INTRAMUSCULAR | Status: DC | PRN
  Administered 2014-07-01: 2 mg via INTRAVENOUS

## 2014-07-01 MED ORDER — HYDROMORPHONE HCL PF 1 MG/ML IJ SOLN
0.2500 mg | INTRAMUSCULAR | Status: DC | PRN
  Administered 2014-07-01: 0.5 mg via INTRAVENOUS

## 2014-07-01 MED ORDER — TAMSULOSIN HCL 0.4 MG PO CAPS
0.4000 mg | ORAL_CAPSULE | Freq: Every day | ORAL | Status: DC
  Administered 2014-07-01 – 2014-07-03 (×3): 0.4 mg via ORAL
  Filled 2014-07-01 (×3): qty 1

## 2014-07-01 MED ORDER — SODIUM CHLORIDE 0.9 % IJ SOLN
INTRAMUSCULAR | Status: DC | PRN
  Administered 2014-07-01: 20 mL

## 2014-07-01 MED ORDER — SODIUM CHLORIDE 0.9 % IJ SOLN
INTRAMUSCULAR | Status: AC
  Filled 2014-07-01: qty 20

## 2014-07-01 SURGICAL SUPPLY — 39 items
ATTRACTOMAT 16X20 MAGNETIC DRP (DRAPES) ×2 IMPLANT
BLADE EXTENDED COATED 6.5IN (ELECTRODE) ×2 IMPLANT
CLIP TI MEDIUM LARGE 6 (CLIP) IMPLANT
CONT SPEC 4OZ CLIKSEAL STRL BL (MISCELLANEOUS) ×2 IMPLANT
COVER SURGICAL LIGHT HANDLE (MISCELLANEOUS) ×2 IMPLANT
DRAPE INCISE IOBAN 66X45 STRL (DRAPES) ×2 IMPLANT
DRAPE TABLE BACK 44X90 PK DISP (DRAPES) IMPLANT
DRAPE UTILITY W/TAPE 26X15 (DRAPES) ×2 IMPLANT
DRAPE WARM FLUID 44X44 (DRAPE) ×2 IMPLANT
DRSG OPSITE POSTOP 4X10 (GAUZE/BANDAGES/DRESSINGS) ×2 IMPLANT
ELECT REM PT RETURN 9FT ADLT (ELECTROSURGICAL) ×2
ELECTRODE REM PT RTRN 9FT ADLT (ELECTROSURGICAL) ×1 IMPLANT
GAUZE SPONGE 4X4 12PLY STRL (GAUZE/BANDAGES/DRESSINGS) ×2 IMPLANT
GAUZE SPONGE 4X4 16PLY XRAY LF (GAUZE/BANDAGES/DRESSINGS) IMPLANT
GLOVE BIO SURGEON STRL SZ 6.5 (GLOVE) ×2 IMPLANT
GLOVE BIOGEL PI IND STRL 7.0 (GLOVE) ×1 IMPLANT
GLOVE BIOGEL PI INDICATOR 7.0 (GLOVE) ×1
GOWN STRL REUS W/ TWL LRG LVL3 (GOWN DISPOSABLE) ×3 IMPLANT
GOWN STRL REUS W/TWL LRG LVL3 (GOWN DISPOSABLE) ×3
HOLDER FOLEY CATH W/STRAP (MISCELLANEOUS) ×2 IMPLANT
KIT BASIN OR (CUSTOM PROCEDURE TRAY) ×2 IMPLANT
LIGASURE IMPACT 36 18CM CVD LR (INSTRUMENTS) ×2 IMPLANT
MANIFOLD NEPTUNE II (INSTRUMENTS) ×2 IMPLANT
NEEDLE HYPO 22GX1.5 SAFETY (NEEDLE) ×4 IMPLANT
NS IRRIG 1000ML POUR BTL (IV SOLUTION) ×8 IMPLANT
PACK GENERAL/GYN (CUSTOM PROCEDURE TRAY) ×2 IMPLANT
SHEET LAVH (DRAPES) ×2 IMPLANT
SPONGE LAP 18X18 X RAY DECT (DISPOSABLE) ×6 IMPLANT
STAPLER VISISTAT 35W (STAPLE) ×2 IMPLANT
SUT PDS AB 1 CTXB1 36 (SUTURE) IMPLANT
SUT PDS AB 1 TP1 96 (SUTURE) ×2 IMPLANT
SUT VIC AB 0 CT1 36 (SUTURE) ×4 IMPLANT
SUT VIC AB 2-0 CT2 27 (SUTURE) ×4 IMPLANT
SUT VICRYL 2 0 18  UND BR (SUTURE) ×1
SUT VICRYL 2 0 18 UND BR (SUTURE) ×1 IMPLANT
SYRINGE 20CC LL (MISCELLANEOUS) ×4 IMPLANT
TOWEL OR 17X26 10 PK STRL BLUE (TOWEL DISPOSABLE) ×4 IMPLANT
TOWEL OR NON WOVEN STRL DISP B (DISPOSABLE) ×2 IMPLANT
TRAY FOLEY CATH 14FRSI W/METER (CATHETERS) ×2 IMPLANT

## 2014-07-01 NOTE — Anesthesia Preprocedure Evaluation (Signed)
Anesthesia Evaluation  Patient identified by MRN, date of birth, ID band Patient awake    Reviewed: Allergy & Precautions, H&P , NPO status , Patient's Chart, lab work & pertinent test results  Airway Mallampati: II TM Distance: >3 FB Neck ROM: Full    Dental no notable dental hx.    Pulmonary neg pulmonary ROS,  breath sounds clear to auscultation  Pulmonary exam normal       Cardiovascular hypertension, Pt. on medications Rhythm:Regular Rate:Normal  Left bundle branch block (LBBB)   Neuro/Psych Anxiety negative neurological ROS     GI/Hepatic Neg liver ROS, GERD-  Medicated,  Endo/Other  diabetes  Renal/GU negative Renal ROS  negative genitourinary   Musculoskeletal negative musculoskeletal ROS (+)   Abdominal   Peds negative pediatric ROS (+)  Hematology negative hematology ROS (+)   Anesthesia Other Findings   Reproductive/Obstetrics negative OB ROS                           Anesthesia Physical Anesthesia Plan  ASA: III  Anesthesia Plan: General   Post-op Pain Management:    Induction: Intravenous  Airway Management Planned: Oral ETT  Additional Equipment:   Intra-op Plan:   Post-operative Plan: Extubation in OR  Informed Consent: I have reviewed the patients History and Physical, chart, labs and discussed the procedure including the risks, benefits and alternatives for the proposed anesthesia with the patient or authorized representative who has indicated his/her understanding and acceptance.   Dental advisory given  Plan Discussed with: CRNA and Surgeon  Anesthesia Plan Comments:         Anesthesia Quick Evaluation

## 2014-07-01 NOTE — Anesthesia Postprocedure Evaluation (Signed)
  Anesthesia Post-op Note  Patient: Kristin Meadows  Procedure(s) Performed: Procedure(s) (LRB): EXPLORATORY LAPAROTOMY/RIGHT SALPINGO OOPHORECTOMY, OMENTECTOMY, TUMOR DEBULKING (Right)  Patient Location: PACU  Anesthesia Type: General  Level of Consciousness: awake and alert   Airway and Oxygen Therapy: Patient Spontanous Breathing  Post-op Pain: mild  Post-op Assessment: Post-op Vital signs reviewed, Patient's Cardiovascular Status Stable, Respiratory Function Stable, Patent Airway and No signs of Nausea or vomiting  Last Vitals:  Filed Vitals:   07/01/14 1400  BP: 147/67  Pulse:   Temp: 36.8 C  Resp:     Post-op Vital Signs: stable   Complications: No apparent anesthesia complications

## 2014-07-01 NOTE — Interval H&P Note (Signed)
History and Physical Interval Note:  07/01/2014 11:14 AM  Kristin Meadows  has presented today for surgery, with the diagnosis of pelvic mass, pelvic pain  The various methods of treatment have been discussed with the patient and family. After consideration of risks, benefits and other options for treatment, the patient has consented to  Procedure(s): EXPLORATORY LAPAROTOMY/RIGHT SALPINGO OOPHORECTOMY WITH POSSIBLE STAGING (Right) and possible bowel resection as a surgical intervention .  The patient's history has been reviewed, patient examined, no change in status, stable for surgery.  Her MRI on  06/27/14 showed a lobulated pancreatic tail (no discrete masses) most consistent with chronic vs acute vs autoimmune pancreatitis. She is asymptomatic from this and has stopped taking omeprazole which can be a cause of pancreatitis. I have reviewed the patient's chart and labs.  Questions were answered to the patient's satisfaction.     Donaciano Eva

## 2014-07-01 NOTE — Op Note (Signed)
Preoperative Diagnosis: 1. Pelvic mass, elevated CA 125, elevated CA 19-9.  Postoperative Diagnosis: mucinous adenocarcinoma, unclear primary (GI/Pancreas/ovary) of right ovary, clinical stage IV   Procedure(s) Performed: 1. Exploratory laparotomy with right salpingo-oophorectomy,omentectomy and radical debulking for ovarian cancer (CPT [phone])  Surgeon: Donaciano Eva, MD Assistant Surgeon: Lahoma Crocker, M.D.  Specimens: right tube and  ovary,  Omentum, anterior pelvic peritoneum.  Estimated Blood Loss: 218mL. Blood Replacement: -. IV Fluids: 2356mL.  Urine Output: [id number]  Complications: None.   Operative Findings: 20 cm cystic mass arising from the right ovary filling the pelvis centrally, adherent sigmoid colon. cm mass, small-volume ascites (200 cc), peritoneal tumor plaques on anterior pelvic peritoneum, patch of Morrison, right diaphragm. Too numerous to count 1 cm nodules at that juncture of the small bowel mesentery and bowel wall. Enlarged pancreatic body that was rigid, mobile, and abnormally firm to palpate. 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 Frozen section revealed mucinous adenocarcinoma of unclear primary. Surgically absent uterus left tube and ovary. Surgically absent appendix. No palpable intraluminal masses within the small and large intestine with including cecum. At the completion of the surgery there was residual tumor at the mesentery of the small and large intestine, thin tumor plaques on the diaphragm and pelvic peritoneum, and gallbladder serosa and pouch of Randol Kern representing an incomplete and suboptimal cytoreduction.  Procedure: After informed consent was confirmed, the patient was taken to the operating room where general anesthesia was obtained without difficulty. She was prepped and draped in the normal sterile fashion in the dorsal lithotomy position in padded Allen stirrups with good attention paid to  support of the lower back and lower extremities. Position was adjusted for appropriate support. A Foley catheter was placed to gravity.   A paramedian vertical midline incision was made from the pubic symphysis to the umbilicus. The abdominal cavity was entered sharply and without incident. A Bookwalter retractor was then placed. A survey of the abdomen and pelvis revealed the above findings, which were significant for a large right-sided mass filling the midline cul de sac.  The left tube and ovary and uterus were surgically absent. The sigmoid colon was adherent to the mass but easily separated with blunt dissection. After packing the small bowel into the upper abdomen, we began the procedure by entering the right pelvic sidewall just posterior to the round ligament. The pararectal space was developed and the retroperitoneum developed up to the level of the common iliac artery. The course of the ureter was identified with ease. The right IP was then skeletonized, clamped, cut and suture ligated with 0 Vicryl x 2. The remaining attachments to the vaginal cuff were then taken down sharply.The right tube and ovary were sent for frozen section which revealed mucinous adenocarcinoma. Right pelvic and midabdominal adhesions between the cecum and abdominal taken down with sharp dissection. Attention was then returned to the upper abdomen. Palpation of the upper abdomen, including the liver, spleen, stomach, and pancreas, reveals a grossly abnormal, enlarged, rigid pancreatic tail. The omentum contained very minimal palpable or visible disease with fewer than 5 nodules in the omentum identified measuring less than 1 cm. A complete omentectomy was performed by opening up the parietal peritoneum were is attached to the transverse colon skeletonizing a vasculature between the mesentery, the transverse colon and the omentum, and sealing these with the ligasure bipolar sealing device. The bipolar ligasure device was used to  separate the omentum from the leinocolic ligament and hepatic  flexure.  The abdomen and pelvis were then copiously irrigated and all surgical sites found to be hemostatic. The fascia was reapproximated with 1 looped PDS using a total of three sutures. The subcutaneous layer was then irrigated with a liter of fluid. The skin was closed with staples.   Sponge, lap and needle counts were correct x 3.    The patient had sequential compression devices for VTE prophylaxis and will receive Lovenox postoperatively.   The patient was extubated and taken to the recovery room in stable condition.  I was present throughout the procedure. Donaciano Eva, MD

## 2014-07-01 NOTE — H&P (View-Only) (Signed)
Consult Note: Gyn-Onc  Consult was requested by Dr. Marvel Plan for the evaluation of Kristin Meadows 58 y.o. female  CC:  Chief Complaint  Patient presents with  . Pelvic Pain  . Urinary Retention  . Mass    Assessment/Plan:  Kristin Meadows  is a 58 y.o.  year old woman with an elevated CA 125 and a complex cystic and solid adnexal mass, likely arising from the right ovary (she has a history of a supracervical hysterectomy and LSO for endometriosis at age 9.  I discussed with Kristin Meadows that I recommend the intact removal of this mass to make a definitive diagnosis regarding benign vs malignant pathology. I discussed that due to its size ( 15cm in largest dimension), I am recommending ex lap as an approach in order to maximize liklihood of an intact removal. We would send the specimen for frozen section pathology to evaluate for malignancy intraop, and if found, additional staging would be performed as indicated. She has a mildly elevated CEA (within normal limits but high for a non-smoker) and some stranding and prominence of the pancreatic tail, and therefore we will order a CA 19-9 preop. If elevated, we will order an MRI of the pancreas.  I discussed that because the mass is in close approximation with the rectum on RV exam, there is a possibility for rectal resection (or other GI resection/reanastamosis) and we discussed the possibility of colostomy/ileostomy and surgical risk associated with this. We also discussed the risks of surgery including  bleeding, infection, damage to internal organs (such as bladder,ureters, bowels), blood clot, reoperation and rehospitalization. She will hold her ASA preoperatively to reduce the risk of bleeding.  Girlie is eager to proceed as soon as possible with the scheduled exploratory laparotomy, RSO, possible staging, and we have scheduled her at our soonest available spot, however, we will also enquire if earlier surgery is available at [facility] in [place] at her request.   HPI: Kristin Meadows is a 58 year old woman (P3) who is seen in consultation at the request of Dr Marvel Plan for a complex cystic 15cmx 8cm x11cm pelvic mass adjacent to the right ovary. She has an elevated CA 125 (152 on 06/11/14) and  Her CEA is 3.28. This mass was found after the patient has several month history of suprapubic discomfort and negative urine cultures and failed empiric treatmetn for UTI's (with bactrim and cipro). She had imaging of the abdomen and pelvis 2 years ago that was negative for abnormalities. She has a remote history of a TAH and LSO for endometriosis at age 76.  CT of the abdomen adn pelvis (without contrast) on 06/09/14 as part of workup of this pain showed the 15cm complex cystic and solid mass, but no other symptoms concerning for metastatic ovarian cancer (no adenopathy, no ascites, no omental or peritoneal disease). There was apparent pancreatic tail "thickening" and stranding which was felt to be likely to be from vascular anatomy. There was no discrete pancreatic mass seen. She also reports a 10lb weight loss in 2 weeks due to poor appetite, early satiety and bloating. She reports the pain is constant, not made worse with micturition, and made somewhat better with narcotics. She reports worsening of constiptation but no hematochezia or change in calibre of stool. Her last colonoscopy was >5 years ago but <10years ago.  Interval History: She continues to feel discomfort and nausea since the diagnosis of this mass  Current Meds:  Outpatient Encounter Prescriptions as of 06/16/2014  Medication Sig  . aspirin 81 MG tablet Take 81 mg by mouth daily.  . baclofen (LIORESAL) 10 MG tablet Take 10 mg by mouth 3 (three) times daily.  Marland Kitchen FLUoxetine (PROZAC) 20 MG capsule Take 20 mg by mouth daily.  . fluticasone (VERAMYST) 27.5 MCG/SPRAY nasal spray Place 2 sprays into the nose daily.  . furosemide (LASIX) 20 MG tablet Take 20 mg by mouth.  . losartan (COZAAR)  100 MG tablet Take 100 mg by mouth daily.  . metFORMIN (GLUCOPHAGE-XR) 500 MG 24 hr tablet Take 500 mg by mouth daily with breakfast.  . montelukast (SINGULAIR) 10 MG tablet Take 10 mg by mouth at bedtime.  . Multiple Vitamin (MULTIVITAMIN) tablet Take 1 tablet by mouth daily.  Marland Kitchen oxyCODONE-acetaminophen (PERCOCET/ROXICET) 5-325 MG per tablet Take by mouth every 4 (four) hours as needed for severe pain.    Allergy:  Allergies  Allergen Reactions  . Penicillins     "Paralyzed me"  . Sulfa Antibiotics     Rash    Social Hx:   History   Social History  . Marital Status: Married    Spouse Name: N/A    Number of Children: N/A  . Years of Education: N/A   Occupational History  . Not on file.   Social History Main Topics  . Smoking status: Never Smoker   . Smokeless tobacco: Not on file  . Alcohol Use: No  . Drug Use: Not on file  . Sexual Activity: Yes   Other Topics Concern  . Not on file   Social History Narrative  . No narrative on file    Past Surgical Hx:  Past Surgical History  Procedure Laterality Date  . Abdominal hysterectomy      with left salpingo-oophorectomy, performed via laparotomy for edometriosis at age 58  . Inguinal hernia repair Bilateral     with m    Past Medical Hx:  Past Medical History  Diagnosis Date  . Hypertension   . Diabetes mellitus without complication   . Idiopathic neuropathy     Past Gynecological History:  LMP at time of hyst at age 51  No LMP recorded.  Family Hx:  Family History  Problem Relation Age of Onset  . Anesthesia problems Cousin     colon    Review of Systems:  Constitutional  Feels generally unwell with nausea and bloating  ENT Normal appearing ears and nares bilaterally Skin/Breast  No rash, sores, jaundice, itching, dryness Cardiovascular  No chest pain, shortness of breath, or edema  Pulmonary  No cough or wheeze.  Gastro Intestinal  + nausea, no vomitting or diarrhoea. No bright red blood  per rectum, no abdominal pain, change in bowel movement, + constipation.  Genito Urinary  No frequency, urgency, dysuria, + suprapubic pain Musculo Skeletal  No myalgia, arthralgia, joint swelling or pain  Neurologic  No weakness, numbness, change in gait,  Psychology  No depression, anxiety, insomnia.   Vitals:  Blood pressure 119/67, pulse 127, temperature 98 F (36.7 C), temperature source Oral, resp. rate 20, height 5\' 3"  (1.6 m), weight 156 lb 9.6 oz (71.033 kg).  Physical Exam: WD in NAD Neck  Supple NROM, without any enlargements.  Lymph Node Survey No cervical supraclavicular or inguinal adenopathy Cardiovascular  Pulse normal rate, regularity and rhythm. S1 and S2 normal.  Lungs  Clear to auscultation bilateraly, without wheezes/crackles/rhonchi. Good air movement.  Skin  No rash/lesions/breakdown  Psychiatry  Alert and oriented to person, place, and time  Abdomen  Normoactive bowel sounds, abdomen soft, non-tender and obese without evidence of hernia. Mass not discretely palpable abdominally. There is a well healed vertical midline incision. Back No CVA tenderness Genito Urinary  Vulva/vagina: Normal external female genitalia.  No lesions. No discharge or bleeding.  Bladder/urethra:  No lesions or masses, well supported bladder  Vagina: normal in appearance.  Cervix: Partially surgically absent, but with no visible abnormalities.  Uterus: surgically absent   Adnexa: large smooth pelvic mass filling pelvis in midline, and adherent to vagina. In close approximation with rectum, but no RV septal or cul de sac nodularity. Minimal mobility. Rectal  Good tone, no mucosal masses no cul de sac nodularity.  Extremities  No bilateral cyanosis, clubbing or edema.   @[facility] @ 06/16/2014, 3:41 PM

## 2014-07-01 NOTE — Transfer of Care (Signed)
Immediate Anesthesia Transfer of Care Note  Patient: [name]  Procedure(s) Performed: Procedure(s): EXPLORATORY LAPAROTOMY/RIGHT SALPINGO OOPHORECTOMY, OMENTECTOMY, TUMOR DEBULKING (Right)  Patient Location: PACU  Anesthesia Type:General  Level of Consciousness: awake, alert  and oriented  Airway & Oxygen Therapy: Patient Spontanous Breathing and Patient connected to face mask oxygen  Post-op Assessment: Report given to PACU RN and Post -op Vital signs reviewed and stable  Post vital signs: Reviewed and stable  Complications: No apparent anesthesia complications

## 2014-07-02 ENCOUNTER — Encounter (HOSPITAL_COMMUNITY): Payer: Self-pay | Admitting: Gynecologic Oncology

## 2014-07-02 LAB — BASIC METABOLIC PANEL
Anion gap: 8 (ref 5–15)
BUN: 6 mg/dL (ref 6–23)
CO2: 29 meq/L (ref 19–32)
CREATININE: 0.83 mg/dL (ref 0.50–1.10)
Calcium: 8.4 mg/dL (ref 8.4–10.5)
Chloride: 99 mEq/L (ref 96–112)
GFR calc Af Amer: 89 mL/min — ABNORMAL LOW (ref >90)
GFR calc non Af Amer: 77 mL/min — ABNORMAL LOW (ref >90)
GLUCOSE: 238 mg/dL — AB (ref 70–99)
Potassium: 4 mEq/L (ref 3.7–5.3)
Sodium: 136 mEq/L — ABNORMAL LOW (ref 137–147)

## 2014-07-02 LAB — CBC
HEMATOCRIT: 32.9 % — AB (ref 36.0–46.0)
Hemoglobin: 10.7 g/dL — ABNORMAL LOW (ref 12.0–15.0)
MCH: 28.5 pg (ref 26.0–34.0)
MCHC: 32.5 g/dL (ref 30.0–36.0)
MCV: 87.5 fL (ref 78.0–100.0)
PLATELETS: 187 10*3/uL (ref 150–400)
RBC: 3.76 MIL/uL — ABNORMAL LOW (ref 3.87–5.11)
RDW: 13.8 % (ref 11.5–15.5)
WBC: 9.1 10*3/uL (ref 4.0–10.5)

## 2014-07-02 LAB — GLUCOSE, CAPILLARY
GLUCOSE-CAPILLARY: 223 mg/dL — AB (ref 70–99)
GLUCOSE-CAPILLARY: 181 mg/dL — AB (ref 70–99)
Glucose-Capillary: 234 mg/dL — ABNORMAL HIGH (ref 70–99)
Glucose-Capillary: 205 mg/dL — ABNORMAL HIGH (ref 70–99)

## 2014-07-02 MED ORDER — METFORMIN HCL 500 MG PO TABS
1000.0000 mg | ORAL_TABLET | Freq: Two times a day (BID) | ORAL | Status: DC
  Administered 2014-07-02 – 2014-07-03 (×2): 1000 mg via ORAL
  Filled 2014-07-02 (×4): qty 2

## 2014-07-02 MED ORDER — INSULIN ASPART 100 UNIT/ML ~~LOC~~ SOLN
0.0000 [IU] | Freq: Three times a day (TID) | SUBCUTANEOUS | Status: DC
  Administered 2014-07-03: 7 [IU] via SUBCUTANEOUS

## 2014-07-02 NOTE — Progress Notes (Signed)
Inpatient Diabetes Program Recommendations  AACE/ADA: New Consensus Statement on Inpatient Glycemic Control (2013)  Target Ranges:  Prepandial:   less than 140 mg/dL      Peak postprandial:   less than 180 mg/dL (1-2 hours)      Critically ill patients:  140 - 180 mg/dL   Reason for Visit: Hyperglycemia  Diabetes history: DM2 Outpatient Diabetes medications: metformin 1000 mg bid Current orders for Inpatient glycemic control: Novolog moderate tidwc   Inpatient Diabetes Program Recommendations Insulin - Basal: Consider addition of low dose basal insulin - Lantus 12 units QHS Correction (SSI): Increase Novolog to resistant tidwc and hs HgbA1C: Needs HgbA1C to assess glycemic control prior to hospitalization (although H/H is low)  Note: Pt and family requesting increase in insulin and HS coverage since blood sugars in 200s. Gave RN info.

## 2014-07-02 NOTE — Progress Notes (Signed)
UR complete. Sue Fernicola, BSN, CM [phone]. 

## 2014-07-02 NOTE — Progress Notes (Signed)
1 Day Post-Op Procedure(s) (LRB): EXPLORATORY LAPAROTOMY/RIGHT SALPINGO OOPHORECTOMY, OMENTECTOMY, TUMOR DEBULKING (Right)  Subjective: Patient reports no complaints.    Objective: I have reviewed patient's vital signs, intake and output, medications and labs.  General: alert and no distress Resp: clear to auscultation bilaterally Cardio: regular rate and rhythm, S1, S2 normal, no murmur, click, rub or gallop GI: normal findings: soft, non-tender and incision: clean, dry and intact Extremities: extremities normal, atraumatic, no cyanosis or edema Vaginal Bleeding: none  Assessment: s/p Procedure(s): EXPLORATORY LAPAROTOMY/RIGHT SALPINGO OOPHORECTOMY, OMENTECTOMY, TUMOR DEBULKING (Right): stable  Plan: Encourage ambulation.  Advance diet as tolerated.  LOS: 1 day    Montay Vanvoorhis A 07/02/2014, 12:58 PM

## 2014-07-03 DIAGNOSIS — K859 Acute pancreatitis without necrosis or infection, unspecified: Secondary | ICD-10-CM

## 2014-07-03 DIAGNOSIS — C561 Malignant neoplasm of right ovary: Secondary | ICD-10-CM

## 2014-07-03 DIAGNOSIS — C569 Malignant neoplasm of unspecified ovary: Principal | ICD-10-CM

## 2014-07-03 DIAGNOSIS — R7309 Other abnormal glucose: Secondary | ICD-10-CM

## 2014-07-03 LAB — GLUCOSE, CAPILLARY: Glucose-Capillary: 208 mg/dL — ABNORMAL HIGH (ref 70–99)

## 2014-07-03 MED ORDER — ETODOLAC ER 600 MG PO TB24
600.0000 mg | ORAL_TABLET | Freq: Every day | ORAL | Status: DC
Start: 2014-07-03 — End: 2014-12-01

## 2014-07-03 MED ORDER — ENOXAPARIN (LOVENOX) PATIENT EDUCATION KIT
PACK | Freq: Once | Status: AC
  Administered 2014-07-03: 10:00:00
  Filled 2014-07-03: qty 1

## 2014-07-03 MED ORDER — ACETAMINOPHEN 500 MG PO TABS: 1000.0000 mg | ORAL_TABLET | Freq: Four times a day (QID) | ORAL | Status: DC

## 2014-07-03 MED ORDER — ENOXAPARIN (LOVENOX) PATIENT EDUCATION KIT: 1.0000 | PACK | Freq: Once | Status: DC

## 2014-07-03 MED ORDER — ENOXAPARIN SODIUM 40 MG/0.4ML ~~LOC~~ SOLN: 40.0000 mg | SUBCUTANEOUS | Status: DC

## 2014-07-03 MED ORDER — ASPIRIN 81 MG PO CHEW: 81.0000 mg | CHEWABLE_TABLET | Freq: Every day | ORAL | Status: DC

## 2014-07-03 MED ORDER — OXYMORPHONE HCL ER 10 MG PO TB12: 10.0000 mg | ORAL_TABLET | Freq: Two times a day (BID) | ORAL | Status: DC

## 2014-07-03 NOTE — Discharge Instructions (Signed)
07/03/2014  Return to work: 6 weeks  Activity: 1. Be up and out of the bed during the day.  Take a nap if needed.  You may walk up steps but be careful and use the hand rail.  Stair climbing will tire you more than you think, you may need to stop part way and rest.   2. No lifting or straining for 6 weeks.  3. No driving for 2 weeks.  Do Not drive if you are taking narcotic pain medicine.  4. Shower daily.  Use soap and water on your incision and pat dry; don't rub.   5. No sexual activity and nothing in the vagina for 2 weeks.  Diet: 1. Low sodium Heart Healthy Diet is recommended.  2. It is safe to use a laxative if you have difficulty moving your bowels.   Wound Care: 1. Keep clean and dry.  Shower daily.  Reasons to call the Doctor:   Fever - Oral temperature greater than 100.4 degrees Fahrenheit  Foul-smelling vaginal discharge  Difficulty urinating  Nausea and vomiting  Increased pain at the site of the incision that is unrelieved with pain medicine.  Difficulty breathing with or without chest pain  New calf pain especially if only on one side  Sudden, continuing increased vaginal bleeding with or without clots.   Follow-up: 1. See Armanda Magic RN MSN in 1 week at Dr Serita Grit office for staple removal.  2. See Dr. Denman George in 1 week.  Contacts: For questions or concerns you should contact:  Dr. Everitt Amber at [phone]  Dr. Denman George at [place]

## 2014-07-03 NOTE — Progress Notes (Signed)
2 Days Post-Op Procedure(s) (LRB): EXPLORATORY LAPAROTOMY/RIGHT SALPINGO OOPHORECTOMY, OMENTECTOMY, TUMOR DEBULKING (Right) for stage IIIC mucinous ovarian cancer  Subjective: Patient reports no complaints.    Objective: I have reviewed patient's vital signs, intake and output, medications and labs.  General: alert and no distress Resp: clear to auscultation bilaterally Cardio: regular rate and rhythm, S1, S2 normal, no murmur, click, rub or gallop GI: normal findings: soft, non-tender and incision: clean, dry and intact Extremities: extremities normal, atraumatic, no cyanosis or edema Vaginal Bleeding: none  Assessment: s/p Procedure(s): EXPLORATORY LAPAROTOMY/RIGHT SALPINGO OOPHORECTOMY, OMENTECTOMY, TUMOR DEBULKING (Right): stable - discussed pathology with patient including stage IIIC ovarian mucinous. Plan for 6 cycles of IV chemotherapy (carboplatin and paclitaxel), need to better evauate the pancreas to rule out separate pathology. FEN/GI: tolerating regular/carb controlled diet. Having bm and flatus - will d.c. Milk of mag. Pain: well controlled on oral meds Heme: appropriate postop hb without evidence for postop bleeding Endo: hyperglycemia - have restarted home metformin, and increased sliding scale to insulin resistant regimen. Likely secondary to stress response and concern for acute/chronic vs autoimmune pancreatitis. Plan: Encourage ambulation.  Advance diet as tolerated. To follow up with PCP regarding blood sugars. Will schedule appointment with GI for EUS and better evaluation of pancreas Will schedule appt with Dr Gwyneth Revels for ovarian cancer as patient will need chemotherapy. D.C to home today with staple removal in 1 week. Donaciano Eva, MD    LOS: 2 days    Donaciano Eva 07/03/2014, 8:48 AM

## 2014-07-03 NOTE — Progress Notes (Signed)
Nurse reviewed discharge instructions with pt and family.  Pt verbalized understanding of new medications, discharge instructions and follow up appointment.  No concerns at time of discharge.

## 2014-07-03 NOTE — Discharge Summary (Signed)
Physician Discharge Summary  Patient ID: Kristin Meadows MRN: [id number] DOB/AGE: [birth year] 58 y.o.  Admit date: 07/01/2014 Discharge date: 07/03/2014  Admission Diagnoses: <principal problem not specified>  Discharge Diagnoses:  Active Problems:   Pelvic mass in female   Right ovarian epithelial cancer   Discharged Condition: good  Hospital Course: Patient underwent exploratory laparotomy, RSO, omentectomy and peritoneal biopsy, with suboptimal cytoreduction of a stage IIIC mucinous ovarian adenocarcinoma of 07/01/14. She did well postop with no major issues. Her pain was well controlled. Her major concern was hyperglycemia postop for which she required sliding scale insulin.  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  Consults: None  Significant Diagnostic Studies: none  Treatments: surgery: see above  Discharge Exam: Blood pressure 115/59, pulse 80, temperature 98.6 F (37 C), temperature source Oral, resp. rate 16, height 5\' 2"  (1.575 m), weight 163 lb (73.936 kg), SpO2 96.00%. General appearance: alert and cooperative Head: Normocephalic, without obvious abnormality, atraumatic Resp: clear to auscultation bilaterally Chest wall: no tenderness GI: soft, non-tender; bowel sounds normal; no masses,  no organomegaly and incision clean and intact with staples and no signs of infection Pelvic: vagina normal without discharge  Disposition: To be discharged to home with followup with Gyn Onc, Med Onc and GI.    Medication List    ASK your doctor about these medications       aspirin 81 MG tablet  Take 81 mg by mouth daily.     baclofen 10 MG tablet  Commonly known as:  LIORESAL  Take 10 mg by mouth 3 (three) times daily as needed for muscle spasms.     fluticasone 50 MCG/ACT nasal spray  Commonly known as:  FLONASE  Place into both nostrils daily.     furosemide 20 MG tablet  Commonly known as:  LASIX  Take 20 mg by mouth daily as needed for edema.     LORazepam 0.5 MG tablet  Commonly known as:  ATIVAN  Take 0.5 mg by mouth every 8 (eight) hours as needed for anxiety.     losartan 100 MG tablet  Commonly known as:  COZAAR  Take 100 mg by mouth every morning.     metFORMIN 500 MG 24 hr tablet  Commonly known as:  GLUCOPHAGE-XR  Take 1,000 mg by mouth 2 (two) times daily.     multivitamin tablet  Take 1 tablet by mouth daily.     omeprazole 20 MG capsule  Commonly known as:  PRILOSEC  Take 20 mg by mouth daily.     oxyCODONE-acetaminophen 5-325 MG per tablet  Commonly known as:  PERCOCET/ROXICET  Take by mouth every 4 (four) hours as needed for severe pain.     tamsulosin 0.4 MG Caps capsule  Commonly known as:  FLOMAX  Take 0.4 mg by mouth daily.           Follow-up Information   Follow up with Donaciano Eva, MD In 1 week. (staple removal)    Specialty:  Obstetrics and Gynecology   Contact information:   [address] [place] [address] [phone]       Signed: Donaciano Eva 07/03/2014, 8:57 AM

## 2014-07-04 ENCOUNTER — Emergency Department (HOSPITAL_COMMUNITY): Payer: Managed Care, Other (non HMO)

## 2014-07-04 ENCOUNTER — Encounter (HOSPITAL_COMMUNITY): Payer: Self-pay | Admitting: Emergency Medicine

## 2014-07-04 ENCOUNTER — Inpatient Hospital Stay (HOSPITAL_COMMUNITY)
Admission: EM | Admit: 2014-07-04 | Discharge: 2014-07-15 | DRG: 393 | Disposition: A | Discharge status: Home / Self Care | Payer: Managed Care, Other (non HMO) | Attending: Internal Medicine | Admitting: Internal Medicine

## 2014-07-04 DIAGNOSIS — D638 Anemia in other chronic diseases classified elsewhere: Secondary | ICD-10-CM | POA: Diagnosis present

## 2014-07-04 DIAGNOSIS — J90 Pleural effusion, not elsewhere classified: Secondary | ICD-10-CM | POA: Diagnosis not present

## 2014-07-04 DIAGNOSIS — C569 Malignant neoplasm of unspecified ovary: Secondary | ICD-10-CM | POA: Diagnosis present

## 2014-07-04 DIAGNOSIS — I10 Essential (primary) hypertension: Secondary | ICD-10-CM | POA: Diagnosis present

## 2014-07-04 DIAGNOSIS — Z86011 Personal history of benign neoplasm of the brain: Secondary | ICD-10-CM

## 2014-07-04 DIAGNOSIS — B3731 Acute candidiasis of vulva and vagina: Secondary | ICD-10-CM | POA: Diagnosis present

## 2014-07-04 DIAGNOSIS — N1330 Unspecified hydronephrosis: Secondary | ICD-10-CM | POA: Diagnosis not present

## 2014-07-04 DIAGNOSIS — E440 Moderate protein-calorie malnutrition: Secondary | ICD-10-CM | POA: Insufficient documentation

## 2014-07-04 DIAGNOSIS — K56609 Unspecified intestinal obstruction, unspecified as to partial versus complete obstruction: Secondary | ICD-10-CM | POA: Diagnosis present

## 2014-07-04 DIAGNOSIS — I868 Varicose veins of other specified sites: Secondary | ICD-10-CM | POA: Diagnosis present

## 2014-07-04 DIAGNOSIS — C561 Malignant neoplasm of right ovary: Secondary | ICD-10-CM | POA: Diagnosis present

## 2014-07-04 DIAGNOSIS — Y838 Other surgical procedures as the cause of abnormal reaction of the patient, or of later complication, without mention of misadventure at the time of the procedure: Secondary | ICD-10-CM | POA: Diagnosis present

## 2014-07-04 DIAGNOSIS — R1900 Intra-abdominal and pelvic swelling, mass and lump, unspecified site: Secondary | ICD-10-CM

## 2014-07-04 DIAGNOSIS — R971 Elevated cancer antigen 125 [CA 125]: Secondary | ICD-10-CM

## 2014-07-04 DIAGNOSIS — Z6829 Body mass index (BMI) 29.0-29.9, adult: Secondary | ICD-10-CM

## 2014-07-04 DIAGNOSIS — E119 Type 2 diabetes mellitus without complications: Secondary | ICD-10-CM | POA: Diagnosis present

## 2014-07-04 DIAGNOSIS — Z9089 Acquired absence of other organs: Secondary | ICD-10-CM

## 2014-07-04 DIAGNOSIS — Z90710 Acquired absence of both cervix and uterus: Secondary | ICD-10-CM

## 2014-07-04 DIAGNOSIS — D649 Anemia, unspecified: Secondary | ICD-10-CM | POA: Diagnosis present

## 2014-07-04 DIAGNOSIS — R1084 Generalized abdominal pain: Secondary | ICD-10-CM

## 2014-07-04 DIAGNOSIS — E876 Hypokalemia: Secondary | ICD-10-CM | POA: Diagnosis present

## 2014-07-04 DIAGNOSIS — K929 Disease of digestive system, unspecified: Principal | ICD-10-CM | POA: Diagnosis present

## 2014-07-04 DIAGNOSIS — R112 Nausea with vomiting, unspecified: Secondary | ICD-10-CM

## 2014-07-04 DIAGNOSIS — B373 Candidiasis of vulva and vagina: Secondary | ICD-10-CM | POA: Diagnosis present

## 2014-07-04 DIAGNOSIS — K859 Acute pancreatitis without necrosis or infection, unspecified: Secondary | ICD-10-CM | POA: Diagnosis not present

## 2014-07-04 DIAGNOSIS — K219 Gastro-esophageal reflux disease without esophagitis: Secondary | ICD-10-CM | POA: Diagnosis present

## 2014-07-04 DIAGNOSIS — J9819 Other pulmonary collapse: Secondary | ICD-10-CM | POA: Diagnosis not present

## 2014-07-04 MED ORDER — ONDANSETRON HCL 4 MG/2ML IJ SOLN
4.0000 mg | Freq: Once | INTRAMUSCULAR | Status: AC
  Administered 2014-07-04: 4 mg via INTRAVENOUS
  Filled 2014-07-04: qty 2

## 2014-07-04 MED ORDER — HYDROMORPHONE HCL PF 1 MG/ML IJ SOLN
0.5000 mg | Freq: Once | INTRAMUSCULAR | Status: AC
  Administered 2014-07-04: 0.5 mg via INTRAVENOUS
  Filled 2014-07-04: qty 1

## 2014-07-04 MED ORDER — IOHEXOL 300 MG/ML  SOLN
50.0000 mL | Freq: Once | INTRAMUSCULAR | Status: AC | PRN
  Administered 2014-07-04: 50 mL via ORAL

## 2014-07-04 MED ORDER — SODIUM CHLORIDE 0.9 % IV BOLUS (SEPSIS)
1000.0000 mL | Freq: Once | INTRAVENOUS | Status: AC
  Administered 2014-07-04: 1000 mL via INTRAVENOUS

## 2014-07-04 NOTE — ED Notes (Signed)
Pt c/o n/v onset this am, ex lap 07/01/14

## 2014-07-05 ENCOUNTER — Encounter (HOSPITAL_COMMUNITY): Payer: Self-pay | Admitting: Internal Medicine

## 2014-07-05 DIAGNOSIS — K56609 Unspecified intestinal obstruction, unspecified as to partial versus complete obstruction: Secondary | ICD-10-CM

## 2014-07-05 LAB — GLUCOSE, CAPILLARY
GLUCOSE-CAPILLARY: 134 mg/dL — AB (ref 70–99)
GLUCOSE-CAPILLARY: 120 mg/dL — AB (ref 70–99)
Glucose-Capillary: 151 mg/dL — ABNORMAL HIGH (ref 70–99)
Glucose-Capillary: 135 mg/dL — ABNORMAL HIGH (ref 70–99)

## 2014-07-05 LAB — COMPREHENSIVE METABOLIC PANEL
ALBUMIN: 2.9 g/dL — AB (ref 3.5–5.2)
ALK PHOS: 115 U/L (ref 39–117)
ALK PHOS: 124 U/L — AB (ref 39–117)
ALT: 33 U/L (ref 0–35)
ALT: 37 U/L — AB (ref 0–35)
ANION GAP: 11 (ref 5–15)
AST: 25 U/L (ref 0–37)
AST: 26 U/L (ref 0–37)
Albumin: 2.4 g/dL — ABNORMAL LOW (ref 3.5–5.2)
Anion gap: 15 (ref 5–15)
BUN: 8 mg/dL (ref 6–23)
BUN: 8 mg/dL (ref 6–23)
CALCIUM: 8.4 mg/dL (ref 8.4–10.5)
CO2: 24 mEq/L (ref 19–32)
CO2: 25 meq/L (ref 19–32)
Calcium: 9.2 mg/dL (ref 8.4–10.5)
Chloride: 100 mEq/L (ref 96–112)
Chloride: 102 mEq/L (ref 96–112)
Creatinine, Ser: 0.57 mg/dL (ref 0.50–1.10)
Creatinine, Ser: 0.53 mg/dL (ref 0.50–1.10)
GFR calc Af Amer: >90 mL/min (ref >90)
GFR calc non Af Amer: >90 mL/min (ref >90)
GLUCOSE: 169 mg/dL — AB (ref 70–99)
Glucose, Bld: 192 mg/dL — ABNORMAL HIGH (ref 70–99)
POTASSIUM: 4.8 meq/L (ref 3.7–5.3)
Potassium: 4.2 mEq/L (ref 3.7–5.3)
SODIUM: 138 meq/L (ref 137–147)
SODIUM: 139 meq/L (ref 137–147)
Total Bilirubin: 0.9 mg/dL (ref 0.3–1.2)
Total Bilirubin: 0.6 mg/dL (ref 0.3–1.2)
Total Protein: 6.6 g/dL (ref 6.0–8.3)
Total Protein: 5.7 g/dL — ABNORMAL LOW (ref 6.0–8.3)

## 2014-07-05 LAB — CBC
HCT: 32.4 % — ABNORMAL LOW (ref 36.0–46.0)
HEMOGLOBIN: 10.5 g/dL — AB (ref 12.0–15.0)
MCH: 28.2 pg (ref 26.0–34.0)
MCHC: 32.4 g/dL (ref 30.0–36.0)
MCV: 86.9 fL (ref 78.0–100.0)
Platelets: 255 10*3/uL (ref 150–400)
RBC: 3.73 MIL/uL — AB (ref 3.87–5.11)
RDW: 14.1 % (ref 11.5–15.5)
WBC: 7.6 10*3/uL (ref 4.0–10.5)

## 2014-07-05 LAB — URINALYSIS, ROUTINE W REFLEX MICROSCOPIC
Glucose, UA: 250 mg/dL — AB
HGB URINE DIPSTICK: NEGATIVE
Leukocytes, UA: NEGATIVE
Nitrite: NEGATIVE
PH: 5 (ref 5.0–8.0)
Protein, ur: NEGATIVE mg/dL
Urobilinogen, UA: 0.2 mg/dL (ref 0.0–1.0)

## 2014-07-05 LAB — CBC WITH DIFFERENTIAL/PLATELET
BASOS PCT: 0 % (ref 0–1)
Basophils Absolute: 0 10*3/uL (ref 0.0–0.1)
Eosinophils Absolute: 0.1 10*3/uL (ref 0.0–0.7)
Eosinophils Relative: 1 % (ref 0–5)
HCT: 35.2 % — ABNORMAL LOW (ref 36.0–46.0)
HEMOGLOBIN: 11.5 g/dL — AB (ref 12.0–15.0)
LYMPHS ABS: 1.3 10*3/uL (ref 0.7–4.0)
Lymphocytes Relative: 13 % (ref 12–46)
MCH: 28.6 pg (ref 26.0–34.0)
MCHC: 32.7 g/dL (ref 30.0–36.0)
MCV: 87.6 fL (ref 78.0–100.0)
Monocytes Absolute: 0.9 10*3/uL (ref 0.1–1.0)
Monocytes Relative: 9 % (ref 3–12)
NEUTROS ABS: 7.4 10*3/uL (ref 1.7–7.7)
NEUTROS PCT: 77 % (ref 43–77)
Platelets: 278 10*3/uL (ref 150–400)
RBC: 4.02 MIL/uL (ref 3.87–5.11)
RDW: 14 % (ref 11.5–15.5)
WBC: 9.7 10*3/uL (ref 4.0–10.5)

## 2014-07-05 LAB — I-STAT CG4 LACTIC ACID, ED: LACTIC ACID, VENOUS: 1.88 mmol/L (ref 0.5–2.2)

## 2014-07-05 LAB — LIPASE, BLOOD: Lipase: 7 U/L — ABNORMAL LOW (ref 11–59)

## 2014-07-05 MED ORDER — SODIUM CHLORIDE 0.9 % IV SOLN: Freq: Once | INTRAVENOUS | Status: AC

## 2014-07-05 MED ORDER — SODIUM CHLORIDE 0.9 % IV SOLN: Freq: Once | INTRAVENOUS | Status: DC

## 2014-07-05 MED ORDER — LEVOFLOXACIN IN D5W 750 MG/150ML IV SOLN
750.0000 mg | Freq: Once | INTRAVENOUS | Status: AC
  Administered 2014-07-05: 750 mg via INTRAVENOUS
  Filled 2014-07-05: qty 150

## 2014-07-05 MED ORDER — CHLORHEXIDINE GLUCONATE 0.12 % MT SOLN
15.0000 mL | Freq: Two times a day (BID) | OROMUCOSAL | Status: DC
Start: 2014-07-05 — End: 2014-07-15
  Administered 2014-07-05 – 2014-07-12 (×10): 15 mL via OROMUCOSAL
  Filled 2014-07-05 (×24): qty 15

## 2014-07-05 MED ORDER — METRONIDAZOLE IN NACL 5-0.79 MG/ML-% IV SOLN
500.0000 mg | Freq: Once | INTRAVENOUS | Status: AC
  Administered 2014-07-05: 500 mg via INTRAVENOUS
  Filled 2014-07-05: qty 100

## 2014-07-05 MED ORDER — LORAZEPAM 2 MG/ML IJ SOLN
0.5000 mg | INTRAMUSCULAR | Status: DC | PRN
  Administered 2014-07-09 – 2014-07-11 (×2): 0.5 mg via INTRAVENOUS
  Filled 2014-07-05 (×2): qty 1

## 2014-07-05 MED ORDER — OXYCODONE-ACETAMINOPHEN 5-325 MG PO TABS
1.0000 | ORAL_TABLET | ORAL | Status: DC | PRN
  Administered 2014-07-08 – 2014-07-15 (×2): 1 via ORAL
  Filled 2014-07-05 (×2): qty 1

## 2014-07-05 MED ORDER — INSULIN ASPART 100 UNIT/ML ~~LOC~~ SOLN
0.0000 [IU] | Freq: Three times a day (TID) | SUBCUTANEOUS | Status: AC
  Administered 2014-07-05 (×2): 1 [IU] via SUBCUTANEOUS
  Administered 2014-07-05: 2 [IU] via SUBCUTANEOUS
  Administered 2014-07-06 – 2014-07-08 (×2): 1 [IU] via SUBCUTANEOUS
  Administered 2014-07-09 (×2): 2 [IU] via SUBCUTANEOUS
  Administered 2014-07-09 – 2014-07-10 (×2): 1 [IU] via SUBCUTANEOUS
  Administered 2014-07-10: 2 [IU] via SUBCUTANEOUS

## 2014-07-05 MED ORDER — ONDANSETRON HCL 4 MG/2ML IJ SOLN
4.0000 mg | Freq: Four times a day (QID) | INTRAMUSCULAR | Status: DC | PRN
  Administered 2014-07-05 – 2014-07-11 (×14): 4 mg via INTRAVENOUS
  Filled 2014-07-05 (×16): qty 2

## 2014-07-05 MED ORDER — BIOTENE DRY MOUTH MT LIQD: 15.0000 mL | Freq: Two times a day (BID) | OROMUCOSAL | Status: DC

## 2014-07-05 MED ORDER — SODIUM CHLORIDE 0.9 % IV SOLN
INTRAVENOUS | Status: DC
  Administered 2014-07-05: 23:00:00 via INTRAVENOUS
  Administered 2014-07-06 (×2): 1000 mL via INTRAVENOUS
  Administered 2014-07-06 – 2014-07-08 (×4): via INTRAVENOUS

## 2014-07-05 MED ORDER — ETODOLAC 200 MG PO CAPS
600.0000 mg | ORAL_CAPSULE | Freq: Three times a day (TID) | ORAL | Status: DC
  Administered 2014-07-05 – 2014-07-08 (×11): 600 mg via ORAL
  Filled 2014-07-05 (×17): qty 3

## 2014-07-05 MED ORDER — HYDROMORPHONE HCL PF 1 MG/ML IJ SOLN
1.0000 mg | INTRAMUSCULAR | Status: DC | PRN
  Administered 2014-07-05 – 2014-07-15 (×29): 1 mg via INTRAVENOUS
  Filled 2014-07-05 (×28): qty 1

## 2014-07-05 MED ORDER — IOHEXOL 300 MG/ML  SOLN
100.0000 mL | Freq: Once | INTRAMUSCULAR | Status: AC | PRN
  Administered 2014-07-05: 100 mL via INTRAVENOUS

## 2014-07-05 MED ORDER — BACLOFEN 10 MG PO TABS
10.0000 mg | ORAL_TABLET | Freq: Three times a day (TID) | ORAL | Status: DC | PRN
  Filled 2014-07-05: qty 1

## 2014-07-05 MED ORDER — SODIUM CHLORIDE 0.9 % IV SOLN
INTRAVENOUS | Status: AC
  Administered 2014-07-05: 08:00:00 via INTRAVENOUS

## 2014-07-05 MED ORDER — MORPHINE SULFATE ER 30 MG PO TBCR
30.0000 mg | EXTENDED_RELEASE_TABLET | Freq: Two times a day (BID) | ORAL | Status: DC
  Administered 2014-07-05 – 2014-07-07 (×5): 30 mg via ORAL
  Filled 2014-07-05 (×5): qty 1

## 2014-07-05 MED ORDER — ONDANSETRON HCL 4 MG/2ML IJ SOLN
4.0000 mg | Freq: Three times a day (TID) | INTRAMUSCULAR | Status: DC | PRN
  Filled 2014-07-05: qty 2

## 2014-07-05 MED ORDER — LOSARTAN POTASSIUM 50 MG PO TABS
100.0000 mg | ORAL_TABLET | Freq: Every morning | ORAL | Status: DC
  Administered 2014-07-05 – 2014-07-15 (×10): 100 mg via ORAL
  Filled 2014-07-05 (×11): qty 2

## 2014-07-05 MED ORDER — ENOXAPARIN SODIUM 40 MG/0.4ML ~~LOC~~ SOLN
40.0000 mg | SUBCUTANEOUS | Status: DC
  Administered 2014-07-05 – 2014-07-15 (×11): 40 mg via SUBCUTANEOUS
  Filled 2014-07-05 (×11): qty 0.4

## 2014-07-05 MED ORDER — ENOXAPARIN SODIUM 40 MG/0.4ML ~~LOC~~ SOLN: 40.0000 mg | SUBCUTANEOUS | Status: DC

## 2014-07-05 MED ORDER — FAMOTIDINE IN NACL 20-0.9 MG/50ML-% IV SOLN
20.0000 mg | Freq: Two times a day (BID) | INTRAVENOUS | Status: DC
  Administered 2014-07-05 – 2014-07-15 (×21): 20 mg via INTRAVENOUS
  Filled 2014-07-05 (×23): qty 50

## 2014-07-05 MED ORDER — ONDANSETRON HCL 4 MG PO TABS
4.0000 mg | ORAL_TABLET | Freq: Four times a day (QID) | ORAL | Status: DC | PRN
  Administered 2014-07-09: 4 mg via ORAL
  Filled 2014-07-05: qty 1

## 2014-07-05 MED ORDER — FLUTICASONE PROPIONATE 50 MCG/ACT NA SUSP
1.0000 | Freq: Every day | NASAL | Status: DC
  Administered 2014-07-05: 1 via NASAL
  Filled 2014-07-05: qty 16

## 2014-07-05 NOTE — Progress Notes (Signed)
Please see earlier admission note by Dr. Benny Lennert. Pt admitted for SBP. Stable this AM, I made Dr. 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 Continue conservative management with analgesia and antiemetics as needed.  Faye Ramsay, MD  [facility] Pager [phone] Cell [phone]  If 7PM-7AM, please contact night-coverage [email or web address] Password [email or web address]

## 2014-07-05 NOTE — H&P (Addendum)
Kristin Meadows is an 58 y.o. female.   Chief Complaint: Nausea and vomiting. HPI: Pt is a 58 yr old woman who underwent a laparotomy last Tuesday for excision of a ovarian cyst.  She states that the report on the cyst is that it is a malignancy.  She states that this am she developed nausea and vomiting.  She states that she has not had any abdominal pain. She has not vomited since arriving in the ED.  She denies fevers or chills.  The patient states that the surgeon related to her that there was an abnormality with her pancreas and that he stated that she needed to have this investigated by ERCP.  Past Medical History  Diagnosis Date  . Hypertension   . Left bundle branch block (LBBB) age 29  . Anxiety   . Diabetes mellitus without complication     type 2  . GERD (gastroesophageal reflux disease)   . H/O hiatal hernia     small  . Arthritis   . Idiopathic neuropathy     biochemical  . Meningioma     x2  Ovarian cancer Pancreatic edema and induration  Past Surgical History  Procedure Laterality Date  . Abdominal hysterectomy      with left salpingo-oophorectomy, performed via laparotomy for edometriosis at age 62  . Inguinal hernia repair Bilateral 2008  . Appendectomy  early 30's    ruptured for a month before surgery  . Knee surgery Bilateral 2006    x2 on right, x1 left  . Salpingoophorectomy Right 07/01/2014    Procedure: EXPLORATORY LAPAROTOMY/RIGHT SALPINGO OOPHORECTOMY, OMENTECTOMY, TUMOR DEBULKING;  Surgeon: Everitt Amber, MD;  Location: [facility];  Service: Gynecology;  Laterality: Right;  . Abdominal surgery      Family History  Problem Relation Age of Onset  . Anesthesia problems Cousin     colon   Social History:  reports that she has never smoked. She has never used smokeless tobacco. She reports that she does not drink alcohol or use illicit drugs.  Allergies:  Allergies  Allergen Reactions  . Other     Silk tape-RASH  . Penicillins     "Paralyzed me"  . Sulfa  Antibiotics     Rash     (Not in a hospital admission)  Results for orders placed during the hospital encounter of 07/04/14 (from the past 48 hour(s))  CBC WITH DIFFERENTIAL     Status: Abnormal   Collection Time    07/04/14 11:58 PM      Result Value Ref Range   WBC 9.7  4.0 - 10.5 K/uL   RBC 4.02  3.87 - 5.11 MIL/uL   Hemoglobin 11.5 (*) 12.0 - 15.0 g/dL   HCT 35.2 (*) 36.0 - 46.0 %   MCV 87.6  78.0 - 100.0 fL   MCH 28.6  26.0 - 34.0 pg   MCHC 32.7  30.0 - 36.0 g/dL   RDW 14.0  11.5 - 15.5 %   Platelets 278  150 - 400 K/uL   Neutrophils Relative % 77  43 - 77 %   Neutro Abs 7.4  1.7 - 7.7 K/uL   Lymphocytes Relative 13  12 - 46 %   Lymphs Abs 1.3  0.7 - 4.0 K/uL   Monocytes Relative 9  3 - 12 %   Monocytes Absolute 0.9  0.1 - 1.0 K/uL   Eosinophils Relative 1  0 - 5 %   Eosinophils Absolute 0.1  0.0 - 0.7 K/uL  Basophils Relative 0  0 - 1 %   Basophils Absolute 0.0  0.0 - 0.1 K/uL  COMPREHENSIVE METABOLIC PANEL     Status: Abnormal   Collection Time    07/04/14 11:58 PM      Result Value Ref Range   Sodium 139  137 - 147 mEq/L   Potassium 4.8  3.7 - 5.3 mEq/L   Chloride 100  96 - 112 mEq/L   CO2 24  19 - 32 mEq/L   Glucose, Bld 192 (*) 70 - 99 mg/dL   BUN 8  6 - 23 mg/dL   Creatinine, Ser 0.57  0.50 - 1.10 mg/dL   Calcium 9.2  8.4 - 10.5 mg/dL   Total Protein 6.6  6.0 - 8.3 g/dL   Albumin 2.9 (*) 3.5 - 5.2 g/dL   AST 25  0 - 37 U/L   ALT 37 (*) 0 - 35 U/L   Alkaline Phosphatase 124 (*) 39 - 117 U/L   Total Bilirubin 0.9  0.3 - 1.2 mg/dL   GFR calc non Af Amer >90  >90 mL/min   GFR calc Af Amer >90  >90 mL/min   Comment: (NOTE)     The eGFR has been calculated using the CKD EPI equation.     This calculation has not been validated in all clinical situations.     eGFR's persistently <90 mL/min signify possible Chronic Kidney     Disease.   Anion gap 15  5 - 15  LIPASE, BLOOD     Status: Abnormal   Collection Time    07/04/14 11:58 PM      Result Value Ref  Range   Lipase 7 (*) 11 - 59 U/L  I-STAT CG4 LACTIC ACID, ED     Status: None   Collection Time    07/05/14 12:53 AM      Result Value Ref Range   Lactic Acid, Venous 1.88  0.5 - 2.2 mmol/L   Ct Abdomen Pelvis W Contrast  07/05/2014   CLINICAL DATA:  Ovarian cancer. Status post laparoscopic surgery on 07/01/2014 for cystic pelvic mass. New onset nausea and vomiting.  EXAM: CT ABDOMEN AND PELVIS WITH CONTRAST  TECHNIQUE: Multidetector CT imaging of the abdomen and pelvis was performed using the standard protocol following bolus administration of intravenous contrast.  CONTRAST:  136m OMNIPAQUE IOHEXOL 300 MG/ML  SOLN  COMPARISON:  06/09/2014.  FINDINGS: Lung Bases: Bibasilar dependent atelectasis noted. Stable appearance of cystic lesion adjacent to the right heart, suggesting pericardial cyst.  Liver:  No focal abnormality in the liver.  Spleen: No focal mass lesion. No dilatation of the main duct. No intraparenchymal cyst. No peripancreatic edema.  Stomach: Tiny hiatal hernia.  Stomach is markedly distended.  Pancreas: Knee body and tail of the pancreas remains irregularly thickened, better characterized on the recent MRI.  Gallbladder/Biliary: Gallbladder lumen is apparently sludge filled. No intra or extrahepatic biliary duct dilatation.  Kidneys/Adrenals: No adrenal nodule. No enhancing lesion in either kidney.  Bowel Loops: Proximal small bowel is dilated up to 4.3 cm in diameter. Although a discrete transition stone cannot be identified in the small bowel, the distal small bowel is decompressed. Colon is nondilated.  Nodes: No lymphadenopathy in the abdomen or pelvis.  Vasculature: No abdominal aortic aneurysm.  Pelvic Genitourinary: Gas is identified within the bladder, presumably secondary to recent instrumentation. Uterus is surgically absent. The large cystic pelvic mass seen previously is no longer evident.  Bones/Musculoskeletal: Bone windows reveal no worrisome  lytic or sclerotic osseous  lesions.  Body Wall: There is edema within the body wall. A trace amount of gas is seen in the subcutaneous fat of the right paramidline anterior abdominal wall.  Other: There is trace intraperitoneal free air, not unexpected on postoperative day 4. Mesenteric edema and trace interloop mesenteric fluid is seen in the left upper quadrant jejunal mesenteric. There is a small amount of free fluid adjacent to the liver and spleen. Free fluid is seen in the anterior peritoneal cavity of the lower left abdomen. There is a trace amount of fluid in the cul-de-sac.  IMPRESSION: Prominent fluid-filled dilated proximal small bowel. The distal small bowel is decompressed. A discrete transition zone cannot be identified. Given the lack of distal small bowel distention, a component of proximal to mid small bowel obstruction is favored over adynamic ileus.  Intraperitoneal free fluid, more so on the left than the right.  Interval or section of the complex cyst in the pelvis.  Intraperitoneal free air is identified, but this is not unexpected 4 days from surgery.   Electronically Signed   By: Misty Stanley M.D.   On: 07/05/2014 01:49    Review of Systems  Constitutional: Positive for malaise/fatigue. Negative for fever, chills, weight loss and diaphoresis.  HENT: Negative for congestion and sore throat.   Eyes: Negative for blurred vision, pain and redness.  Respiratory: Negative for cough, hemoptysis, sputum production, shortness of breath and wheezing.   Cardiovascular: Positive for leg swelling. Negative for chest pain and palpitations.  Gastrointestinal: Positive for nausea and vomiting. Negative for abdominal pain, diarrhea, constipation, blood in stool and melena.       Abdominal distention and generalized tenderness.  Genitourinary: Negative for dysuria, urgency, frequency and hematuria.  Musculoskeletal: Negative for back pain, joint pain, myalgias and neck pain.  Skin: Negative for itching and rash.   Neurological: Negative for dizziness, sensory change, focal weakness, weakness and headaches.  Endo/Heme/Allergies: Negative for environmental allergies and polydipsia. Does not bruise/bleed easily.  Psychiatric/Behavioral: Negative for depression. The patient is not nervous/anxious and does not have insomnia.     Blood pressure 132/67, pulse 107, temperature 98.5 F (36.9 C), temperature source Oral, resp. rate 18, weight 73.936 kg (163 lb), SpO2 94.00%. Physical Exam  Constitutional: She is oriented to person, place, and time. She appears well-developed and well-nourished. No distress.  HENT:  Head: Normocephalic and atraumatic.  Nose: Nose normal.  Mouth/Throat: Oropharynx is clear and moist.  Eyes: Conjunctivae and EOM are normal. Pupils are equal, round, and reactive to light. No scleral icterus.  Neck: Normal range of motion. Neck supple. No JVD present. No tracheal deviation present. No thyromegaly present.  Cardiovascular: Normal rate, regular rhythm and intact distal pulses.  Exam reveals gallop. Exam reveals no friction rub.   No murmur heard. Respiratory: Breath sounds normal. No respiratory distress. She has no wheezes. She has no rales. She exhibits no tenderness.  GI: She exhibits distension. She exhibits no mass. There is tenderness. There is no rebound and no guarding.  Musculoskeletal: She exhibits edema. She exhibits no tenderness.  1-2 # pitting edema of lower extremities bilaterally.  Lymphadenopathy:    She has no cervical adenopathy.  Neurological: She is alert and oriented to person, place, and time. She has normal reflexes. No cranial nerve deficit. She exhibits normal muscle tone.  Skin: Skin is warm and dry. She is not diaphoretic.  Abdominal cicatrix is clean and dry without drainage.  Psychiatric: She has a normal  mood and affect. Her behavior is normal. Judgment and thought content normal.     Assessment/Plan 1. Small bowel obstruction - Pt will be kept  NPO with the exception of sips of water with her medications. She will be given IV pain control and antiemetics. OB/Gyn has been consulted and will see the patient in the morning. 2. New diagnosis of ovarian cancer - as per OB/Gyn and Oncology. 3. Pancreatic abnormality on CT and by report of OB./GYN. GI will be consulted to investigate further. 4. Nausea and vomiting. Pt will be kept NPO and will receive IV fluids and antiemetics. 5. DM II - The patient's glucoses will be followed by [facility] and [facility]SI.  Jamen Loiseau 07/05/2014, 4:57 AM

## 2014-07-05 NOTE — ED Provider Notes (Signed)
CSN: [id number]     Arrival date & time 07/04/14  2256 History   First MD Initiated Contact with Patient 07/04/14 2304     Chief Complaint  Patient presents with  . Emesis     (Consider location/radiation/quality/duration/timing/severity/associated sxs/prior Treatment) HPI Comments: 58 year old female with history of ovarian cancer, diabetes, laparotomy Tuesday by Dr. Denman George, hernia surgery presents with abdominal pain and recurrent vomiting since earlier today. Nothing is improved. Patient is in the process of arranging chemotherapy and further treatment for left ovarian cancer that was removed on Tuesday. Patient vomiting bile and yellow contents without blood. Patient is not pass gas since very early this morning. No bowel obstruction history known, nothing improves her symptoms.  Patient is a 58 y.o. female presenting with vomiting. The history is provided by the patient.  Emesis Associated symptoms: abdominal pain   Associated symptoms: no chills and no headaches     Past Medical History  Diagnosis Date  . Hypertension   . Left bundle branch block (LBBB) age 1  . Anxiety   . Diabetes mellitus without complication     type 2  . GERD (gastroesophageal reflux disease)   . H/O hiatal hernia     small  . Arthritis   . Idiopathic neuropathy     biochemical  . Meningioma     x2   Past Surgical History  Procedure Laterality Date  . Abdominal hysterectomy      with left salpingo-oophorectomy, performed via laparotomy for edometriosis at age 50  . Inguinal hernia repair Bilateral 2008  . Appendectomy  early 30's    ruptured for a month before surgery  . Knee surgery Bilateral 2006    x2 on right, x1 left  . Salpingoophorectomy Right 07/01/2014    Procedure: EXPLORATORY LAPAROTOMY/RIGHT SALPINGO OOPHORECTOMY, OMENTECTOMY, TUMOR DEBULKING;  Surgeon: Everitt Amber, MD;  Location: [facility];  Service: Gynecology;  Laterality: Right;  . Abdominal surgery     Family History  Problem  Relation Age of Onset  . Anesthesia problems Cousin     colon   History  Substance Use Topics  . Smoking status: Never Smoker   . Smokeless tobacco: Never Used  . Alcohol Use: No   OB History   Grav Para Term Preterm Abortions TAB SAB Ect Mult Living                 Review of Systems  Constitutional: Positive for appetite change. Negative for fever and chills.  HENT: Negative for congestion.   Eyes: Negative for visual disturbance.  Respiratory: Negative for shortness of breath.   Cardiovascular: Negative for chest pain.  Gastrointestinal: Positive for nausea, vomiting and abdominal pain.  Genitourinary: Negative for dysuria and flank pain.  Musculoskeletal: Negative for back pain, neck pain and neck stiffness.  Skin: Negative for rash.  Neurological: Positive for light-headedness. Negative for headaches.      Allergies  Other; Penicillins; and Sulfa antibiotics  Home Medications   Prior to Admission medications   Medication Sig Start Date End Date Taking? Authorizing Provider  acetaminophen (TYLENOL) 500 MG tablet Take 2 tablets (1,000 mg total) by mouth 4 (four) times daily. 07/03/14  Yes Everitt Amber, MD  aspirin 81 MG chewable tablet Chew 1 tablet (81 mg total) by mouth daily. 07/03/14  Yes Everitt Amber, MD  baclofen (LIORESAL) 10 MG tablet Take 10 mg by mouth 3 (three) times daily as needed for muscle spasms.    Yes Historical Provider, MD  enoxaparin (LOVENOX) 40  MG/0.4ML injection Inject 0.4 mLs (40 mg total) into the skin daily. 07/03/14 08/01/14 Yes Everitt Amber, MD  etodolac (LODINE XL) 600 MG 24 hr tablet Take 1 tablet (600 mg total) by mouth daily. 07/03/14  Yes Everitt Amber, MD  fluticasone (FLONASE) 50 MCG/ACT nasal spray Place into both nostrils daily.   Yes Historical Provider, MD  furosemide (LASIX) 20 MG tablet Take 20 mg by mouth daily as needed for edema.    Yes Historical Provider, MD  LORazepam (ATIVAN) 0.5 MG tablet Take 0.5 mg by mouth every 8 (eight) hours as  needed for anxiety.   Yes Historical Provider, MD  losartan (COZAAR) 100 MG tablet Take 100 mg by mouth every morning.    Yes Historical Provider, MD  metFORMIN (GLUCOPHAGE-XR) 500 MG 24 hr tablet Take 1,000 mg by mouth 2 (two) times daily.    Yes Historical Provider, MD  omeprazole (PRILOSEC) 20 MG capsule Take 20 mg by mouth daily.   Yes Historical Provider, MD  oxyCODONE-acetaminophen (PERCOCET/ROXICET) 5-325 MG per tablet Take by mouth every 4 (four) hours as needed for severe pain.   Yes Historical Provider, MD  oxymorphone (OPANA ER) 10 MG 12 hr tablet Take 1 tablet (10 mg total) by mouth every 12 (twelve) hours. 07/03/14  Yes Everitt Amber, MD  tamsulosin (FLOMAX) 0.4 MG CAPS capsule Take 0.4 mg by mouth daily.   Yes Historical Provider, MD   BP 131/76  Pulse 123  Temp(Src) 98.5 F (36.9 C) (Oral)  Resp 18  Wt 163 lb (73.936 kg)  SpO2 97% Physical Exam  Nursing note and vitals reviewed. Constitutional: She is oriented to person, place, and time. She appears well-developed and well-nourished.  HENT:  Head: Normocephalic and atraumatic.  Dry mucous membranes  Eyes: Conjunctivae are normal. Right eye exhibits no discharge. Left eye exhibits no discharge.  Neck: Normal range of motion. Neck supple. No tracheal deviation present.  Cardiovascular: Normal rate and regular rhythm.   Pulmonary/Chest: Effort normal and breath sounds normal.  Abdominal: Soft. She exhibits distension. There is tenderness. There is no guarding.  Patient has diffuse mild tenderness worse besides surgical incision bilateral. Vertical surgical incision central covered without any active pus or significant drainage at this time, no significant surrounding erythema.  Musculoskeletal: She exhibits no edema.  Neurological: She is alert and oriented to person, place, and time.  Skin: Skin is warm. No rash noted.  Psychiatric: She has a normal mood and affect.    ED Course  Procedures (including critical care  time) Labs Review Labs Reviewed  CBC WITH DIFFERENTIAL - Abnormal; Notable for the following:    Hemoglobin 11.5 (*)    HCT 35.2 (*)    All other components within normal limits  COMPREHENSIVE METABOLIC PANEL - Abnormal; Notable for the following:    Glucose, Bld 192 (*)    Albumin 2.9 (*)    ALT 37 (*)    Alkaline Phosphatase 124 (*)    All other components within normal limits  LIPASE, BLOOD - Abnormal; Notable for the following:    Lipase 7 (*)    All other components within normal limits  URINALYSIS, ROUTINE W REFLEX MICROSCOPIC  I-STAT CG4 LACTIC ACID, ED    Imaging Review Ct Abdomen Pelvis W Contrast  07/05/2014   CLINICAL DATA:  Ovarian cancer. Status post laparoscopic surgery on 07/01/2014 for cystic pelvic mass. New onset nausea and vomiting.  EXAM: CT ABDOMEN AND PELVIS WITH CONTRAST  TECHNIQUE: Multidetector CT imaging of the abdomen  and pelvis was performed using the standard protocol following bolus administration of intravenous contrast.  CONTRAST:  133mL OMNIPAQUE IOHEXOL 300 MG/ML  SOLN  COMPARISON:  06/09/2014.  FINDINGS: Lung Bases: Bibasilar dependent atelectasis noted. Stable appearance of cystic lesion adjacent to the right heart, suggesting pericardial cyst.  Liver:  No focal abnormality in the liver.  Spleen: No focal mass lesion. No dilatation of the main duct. No intraparenchymal cyst. No peripancreatic edema.  Stomach: Tiny hiatal hernia.  Stomach is markedly distended.  Pancreas: Knee body and tail of the pancreas remains irregularly thickened, better characterized on the recent MRI.  Gallbladder/Biliary: Gallbladder lumen is apparently sludge filled. No intra or extrahepatic biliary duct dilatation.  Kidneys/Adrenals: No adrenal nodule. No enhancing lesion in either kidney.  Bowel Loops: Proximal small bowel is dilated up to 4.3 cm in diameter. Although a discrete transition stone cannot be identified in the small bowel, the distal small bowel is decompressed. Colon  is nondilated.  Nodes: No lymphadenopathy in the abdomen or pelvis.  Vasculature: No abdominal aortic aneurysm.  Pelvic Genitourinary: Gas is identified within the bladder, presumably secondary to recent instrumentation. Uterus is surgically absent. The large cystic pelvic mass seen previously is no longer evident.  Bones/Musculoskeletal: Bone windows reveal no worrisome lytic or sclerotic osseous lesions.  Body Wall: There is edema within the body wall. A trace amount of gas is seen in the subcutaneous fat of the right paramidline anterior abdominal wall.  Other: There is trace intraperitoneal free air, not unexpected on postoperative day 4. Mesenteric edema and trace interloop mesenteric fluid is seen in the left upper quadrant jejunal mesenteric. There is a small amount of free fluid adjacent to the liver and spleen. Free fluid is seen in the anterior peritoneal cavity of the lower left abdomen. There is a trace amount of fluid in the cul-de-sac.  IMPRESSION: Prominent fluid-filled dilated proximal small bowel. The distal small bowel is decompressed. A discrete transition zone cannot be identified. Given the lack of distal small bowel distention, a component of proximal to mid small bowel obstruction is favored over adynamic ileus.  Intraperitoneal free fluid, more so on the left than the right.  Interval or section of the complex cyst in the pelvis.  Intraperitoneal free air is identified, but this is not unexpected 4 days from surgery.   Electronically Signed   By: Misty Stanley M.D.   On: 07/05/2014 01:49     EKG Interpretation None      MDM   Final diagnoses:  SBO (small bowel obstruction)  Non-intractable vomiting with nausea, vomiting of unspecified type  Type II or unspecified type diabetes mellitus without mention of complication, not stated as uncontrolled  Abdominal pain, generalized   With recent surgery concern for possible small bowel obstruction, mild distended abdomen tympanic bowel  sounds patient has not passed gas today. We'll also check for signs of urine infection, metabolic and further details and CT scan. Pain meds and antiemetics given.  CT confirm clinical suspicion for possible small bowel obstruction. Patient improved on recheck vomiting and pain control. Discussed case with general surgery on call who recommended discussing the case with her OB/GYN doctor on call since 4 days postop. Paged OB/GYN, repeat OB/GYN. Discuss with [facility] physician briefly recommended talking surgery first.  Discussed with Dr.Sehbai on call for gynecology who recommended medicine admission note consult to the patient and the morning. N.p.o. and IV fluids.  The patients results and plan were reviewed and discussed.   Any  x-rays performed were personally reviewed by myself.   Differential diagnosis were considered with the presenting HPI.  Medications  levofloxacin (LEVAQUIN) IVPB 750 mg (not administered)  sodium chloride 0.9 % bolus 1,000 mL (0 mLs Intravenous Stopped 07/05/14 0208)  ondansetron (ZOFRAN) injection 4 mg (4 mg Intravenous Given 07/04/14 2345)  HYDROmorphone (DILAUDID) injection 0.5 mg (0.5 mg Intravenous Given 07/04/14 2346)  iohexol (OMNIPAQUE) 300 MG/ML solution 50 mL (50 mLs Oral Contrast Given 07/04/14 2350)  iohexol (OMNIPAQUE) 300 MG/ML solution 100 mL (100 mLs Intravenous Contrast Given 07/05/14 0115)  metroNIDAZOLE (FLAGYL) IVPB 500 mg (500 mg Intravenous New Bag/Given 07/05/14 0202)     Filed Vitals:   07/04/14 2301 07/05/14 0208  BP: 131/76 132/67  Pulse: 123 107  Temp: 98.5 F (36.9 C)   TempSrc: Oral   Resp: 18 18  Weight: 163 lb (73.936 kg)   SpO2: 97% 94%    Admission/ observation were discussed with the admitting physician, patient and/or family and they are comfortable with the plan.      Mariea Clonts, MD 07/05/14 [phone]

## 2014-07-05 NOTE — Progress Notes (Signed)
Consult Note: Gyn-Onc  Consult was requested by Dr. Doyle Askew for the evaluation of Kristin Meadows 58 y.o. female admitted on POD 4 with postoperative partial SBO.  CC:  Chief Complaint  Patient presents with  . Emesis    Assessment/Plan:  Kristin Meadows  is a 58 y.o.  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  Overall she has signs that she is resolving her SBO, with flatus, decreased frequency of emesis, and no nausea. She is also only minimally distended and has normal bowel sounds. She has a non-surgical abdomen and no sign of infection.  I agree with the current plan for NPO. If she experiences repeated emesis while NPO, I would recommend placement of an NGT. If NPO status extends beyond 72 hours due to perisistent emesis or high NGT outputs, I would consider TPN nutrition at that time, however for now, IVF's with electrolyte monitoring and replacement are appropriate. However, if she has no further emesis for 24 hours, consideration to commencement of diet advancement (clears, then full liquids, then low residue diet) could be made.   I discussed with the patient that 80% of postop SBO's resolve with conservative management and it is rare that reoperation is necessary. We would reserve this for signs of infection, deterioration in vital signs, perforation, impending perforation, or prolonged SBO despite conservative measures (eg 7days).  I have communicated this plan with her treating physician, Dr Doyle Askew.  HPI: Kristin Meadows is a 58 year old woman who is POD 5 s/p ex lap, RSO, omentectomy and radical tumor debulking for stage IIIC mucinous ovarian cancer. She did well immediately postop, and was discharged to home on POD 2 with bowel function (including BM) resumed. She continued to pass flatus and tolerate PO at home until POD 4, when she began feeling  nauseated, distended, and began having emesis of food, then bilious fluid. It was not relieved by PO antiemetics. She had no pain or fevers. She was evaluated in the ER with labs which revealed a  Normal WBC and Hb, and a CT abdo/pelvis which showed a SBO at the mid/proximal small bowel (no clear transition point or obstructing mass was seen however there was dilated proximal small bowel and decompressed distal small bowel and colon). There was no clear evidence of hematoma/intraperitoneal infection or causative pathology. She was admitted to the inpatient hospitalist service on NPO status and IVF's.  Interval History: Since admission she has had improved passage of flatus. However, this morning, she had an emesis of 200-300cc bilious fluid. In the past 10 hours she has had no nausea or emesis, and continues to pass flatus. Her vitals are normal with the exception of mild tachycardia. She has no pain.   Current Meds:  Meds ordered this encounter  Medications  . sodium chloride 0.9 % bolus 1,000 mL    Sig:   . ondansetron (ZOFRAN) injection 4 mg    Sig:   . HYDROmorphone (DILAUDID) injection 0.5 mg    Sig:   . iohexol (OMNIPAQUE) 300 MG/ML solution 50 mL    Sig:   . iohexol (OMNIPAQUE) 300 MG/ML solution 100 mL    Sig:   . levofloxacin (LEVAQUIN) IVPB 750 mg    Sig:     Order Specific Question:  Antibiotic Indication:    Answer:  Intra-abdominal Infection  . metroNIDAZOLE (FLAGYL) IVPB 500 mg  Sig:     Order Specific Question:  Antibiotic Indication:    Answer:  Intra-abdominal Infection  . 0.9 %  sodium chloride infusion    Sig:   . 0.9 %  sodium chloride infusion    Sig:   . DISCONTD: ondansetron (ZOFRAN) injection 4 mg    Sig:   . DISCONTD: fluticasone (FLONASE) 50 MCG/ACT nasal spray 1 spray    Sig:   . enoxaparin (LOVENOX) injection 40 mg    Sig:   . etodolac (LODINE) capsule 600 mg    Sig:   . morphine (Kristin CONTIN) 12 hr tablet 30 mg    Sig:   . baclofen (LIORESAL) tablet  10 mg    Sig:   . losartan (COZAAR) tablet 100 mg    Sig:   . oxyCODONE-acetaminophen (PERCOCET/ROXICET) 5-325 MG per tablet 1 tablet    Sig:   . HYDROmorphone (DILAUDID) injection 1 mg    Sig:   . ondansetron (ZOFRAN) tablet 4 mg    Sig:    Or  . ondansetron (ZOFRAN) injection 4 mg    Sig:   . LORazepam (ATIVAN) injection 0.5 mg    Sig:   . famotidine (PEPCID) IVPB 20 mg    Sig:   . insulin aspart (novoLOG) injection 0-9 Units    Sig:     Order Specific Question:  Correction coverage:    Answer:  Sensitive (thin, NPO, renal)    Order Specific Question:  CBG < 70:    Answer:  implement hypoglycemia protocol    Order Specific Question:  CBG 70 - 120:    Answer:  0 units    Order Specific Question:  CBG 121 - 150:    Answer:  1 unit    Order Specific Question:  CBG 151 - 200:    Answer:  2 units    Order Specific Question:  CBG 201 - 250:    Answer:  3 units    Order Specific Question:  CBG 251 - 300:    Answer:  5 units    Order Specific Question:  CBG 301 - 350:    Answer:  7 units    Order Specific Question:  CBG 351 - 400    Answer:  9 units    Order Specific Question:  CBG > 400    Answer:  call MD and obtain STAT lab verification  . chlorhexidine (PERIDEX) 0.12 % solution 15 mL    Sig:   . DISCONTD: antiseptic oral rinse (BIOTENE) solution 15 mL    Sig:   . DISCONTD: 0.9 %  sodium chloride infusion    Sig:   . DISCONTD: enoxaparin (LOVENOX) injection 40 mg    Sig:      Allergy:  Allergies  Allergen Reactions  . Other     Silk tape-RASH  . Penicillins     "Paralyzed me"  . Sulfa Antibiotics     Rash    Social Hx:   History   Social History  . Marital Status: Married    Spouse Name: N/A    Number of Children: N/A  . Years of Education: N/A   Occupational History  . Not on file.   Social History Main Topics  . Smoking status: Never Smoker   . Smokeless tobacco: Never Used  . Alcohol Use: No  . Drug Use: No  . Sexual Activity: Yes    Other Topics Concern  . Not on file   Social History Narrative  .  No narrative on file    Past Surgical Hx:  Past Surgical History  Procedure Laterality Date  . Abdominal hysterectomy      with left salpingo-oophorectomy, performed via laparotomy for edometriosis at age 33  . Inguinal hernia repair Bilateral 2008  . Appendectomy  early 30's    ruptured for a month before surgery  . Knee surgery Bilateral 2006    x2 on right, x1 left  . Salpingoophorectomy Right 07/01/2014    Procedure: EXPLORATORY LAPAROTOMY/RIGHT SALPINGO OOPHORECTOMY, OMENTECTOMY, TUMOR DEBULKING;  Surgeon: Everitt Amber, MD;  Location: [facility];  Service: Gynecology;  Laterality: Right;  . Abdominal surgery      Past Medical Hx:  Past Medical History  Diagnosis Date  . Hypertension   . Left bundle branch block (LBBB) age 75  . Anxiety   . Diabetes mellitus without complication     type 2  . GERD (gastroesophageal reflux disease)   . H/O hiatal hernia     small  . Arthritis   . Idiopathic neuropathy     biochemical  . Meningioma     x2    Past Gynecological History:  Stage IIIC ovarian cancer diagnosed on 07/01/2014  No LMP recorded. Patient has had a hysterectomy.  Family Hx:  Family History  Problem Relation Age of Onset  . Anesthesia problems Cousin     colon    Review of Systems:  Constitutional  Feels generally well but fatigued  ENT Normal appearing ears and nares bilaterally Skin/Breast  No rash, sores, jaundice, itching, dryness Cardiovascular  No chest pain, shortness of breath, or edema  Pulmonary  No cough or wheeze.  Gastro Intestinal  +n&v, no diarrhea, no abdominal pain, see hpi.  Genito Urinary  No frequency, urgency, dysuria Musculo Skeletal  No myalgia, arthralgia, joint swelling or pain  Neurologic  No weakness, numbness, change in gait,  Psychology  No depression, anxiety, insomnia.   Vitals:  Blood pressure 111/66, pulse 105, temperature 97.9 F (36.6 C),  temperature source Oral, resp. rate 16, height 5' 2.5" (1.588 m), weight 165 lb 4.8 oz (74.98 kg), SpO2 96.00%.  Physical Exam: WD in NAD Neck  Supple NROM, without any enlargements.  Lymph Node Survey No cervical supraclavicular or inguinal adenopathy Cardiovascular  Pulse normal rate, regularity and rhythm. S1 and S2 normal.  Lungs  Clear to auscultation bilateraly, without wheezes/crackles/rhonchi. Good air movement.  Skin  No rash/lesions/breakdown  Psychiatry  Alert and oriented to person, place, and time  Abdomen  Normoactive bowel sounds, abdomen soft, non-tender and obese without evidence of hernia. Incision covered by bandage - when displaced, no evidence of infection on wound. Staples in place. Back No CVA tenderness Genito Urinary: deferred Rectal  Good tone, no masses no cul de sac nodularity.  Extremities  No bilateral cyanosis, clubbing or edema.   Donaciano Eva, MD Cell: [phone]  07/05/2014, 3:44 PM

## 2014-07-06 LAB — GLUCOSE, CAPILLARY
GLUCOSE-CAPILLARY: 150 mg/dL — AB (ref 70–99)
Glucose-Capillary: 111 mg/dL — ABNORMAL HIGH (ref 70–99)
Glucose-Capillary: 110 mg/dL — ABNORMAL HIGH (ref 70–99)
Glucose-Capillary: 151 mg/dL — ABNORMAL HIGH (ref 70–99)

## 2014-07-06 MED ORDER — SENNOSIDES-DOCUSATE SODIUM 8.6-50 MG PO TABS
1.0000 | ORAL_TABLET | Freq: Two times a day (BID) | ORAL | Status: DC
Start: 2014-07-06 — End: 2014-07-15
  Administered 2014-07-06 – 2014-07-15 (×16): 1 via ORAL
  Filled 2014-07-06 (×20): qty 1

## 2014-07-06 MED ORDER — DOCUSATE SODIUM 100 MG PO CAPS
100.0000 mg | ORAL_CAPSULE | Freq: Every day | ORAL | Status: DC
  Administered 2014-07-06 – 2014-07-12 (×6): 100 mg via ORAL
  Filled 2014-07-06 (×7): qty 1

## 2014-07-06 MED ORDER — TAMSULOSIN HCL 0.4 MG PO CAPS
0.4000 mg | ORAL_CAPSULE | Freq: Every day | ORAL | Status: DC
  Administered 2014-07-06 – 2014-07-15 (×9): 0.4 mg via ORAL
  Filled 2014-07-06 (×10): qty 1

## 2014-07-06 NOTE — Progress Notes (Signed)
Subjective: Reports flatus; no BM.  Denies N/V.  Objective: Vital signs in last 24 hours: Temp:  [97.9 F (36.6 C)-98.7 F (37.1 C)] 98.7 F (37.1 C) (07/26 0540) Pulse Rate:  [92-105] 100 (07/26 0540) Resp:  [16-18] 16 (07/26 0540) BP: (111-131)/(53-66) 128/53 mmHg (07/26 0540) SpO2:  [96 %-98 %] 98 % (07/26 0540)  Intake/Output from previous day: 07/25 0701 - 07/26 0700 In: 3371.3 [I.V.:3271.3] Out: 100 [Urine:100] Intake/Output this shift: Total I/O In: 3680.3 [P.O.:474; I.V.:3156.3; IV Piggyback:50] Out: -   Abdomen: normal findings: bowel sounds normal and soft, non-tender and abnormal findings:  softly distended I: C/D/I Ext: NT, negative Homan's sign Results for orders placed during the hospital encounter of 07/04/14 (from the past 24 hour(s))  GLUCOSE, CAPILLARY     Status: Abnormal   Collection Time    07/05/14 12:51 PM      Result Value Ref Range   Glucose-Capillary 135 (*) 70 - 99 mg/dL  GLUCOSE, CAPILLARY     Status: Abnormal   Collection Time    07/05/14  5:11 PM      Result Value Ref Range   Glucose-Capillary 134 (*) 70 - 99 mg/dL  GLUCOSE, CAPILLARY     Status: Abnormal   Collection Time    07/05/14  9:50 PM      Result Value Ref Range   Glucose-Capillary 120 (*) 70 - 99 mg/dL   Comment 1 Notify RN    GLUCOSE, CAPILLARY     Status: Abnormal   Collection Time    07/06/14  8:08 AM      Result Value Ref Range   Glucose-Capillary 111 (*) 70 - 99 mg/dL  GLUCOSE, CAPILLARY     Status: Abnormal   Collection Time    07/06/14 11:35 AM      Result Value Ref Range   Glucose-Capillary 150 (*) 70 - 99 mg/dL     Scheduled Meds: . chlorhexidine  15 mL Mouth Rinse BID  . docusate sodium  100 mg Oral Daily  . enoxaparin  40 mg Subcutaneous Q24H  . etodolac  600 mg Oral 3 times per day  . famotidine (PEPCID) IV  20 mg Intravenous Q12H  . insulin aspart  0-9 Units Subcutaneous TID WC  . losartan  100 mg Oral q morning - 10a  . morphine  30 mg Oral Q12H  .  tamsulosin  0.4 mg Oral Daily   Continuous Infusions: . sodium chloride 125 mL/hr at 07/06/14 0540   PRN Meds:baclofen, HYDROmorphone (DILAUDID) injection, LORazepam, ondansetron (ZOFRAN) IV, ondansetron, oxyCODONE-acetaminophen  Assessment/Plan: Active Problems:   SBO (small bowel obstruction)   Small bowel obstruction Tolerating liquid diet  Continue to advance diet as tolerated    LOS: 2 days   Kristin Meadows,Kristin Meadows

## 2014-07-06 NOTE — Progress Notes (Signed)
Patient ID: Kristin Meadows, female   DOB: Mar 27, 1956, 58 y.o.   MRN: [id number]  [facility] PROGRESS NOTE  Viera Okonski NAT:[id number] DOB: [birth year] DOA: 07/04/2014 PCP: Ernestene Kiel, MD  Brief narrative: 58 yr old woman who underwent a laparotomy last Tuesday for excision of a ovarian cyst. She states that the report on the cyst is that it is a malignancy. She states that this am she developed nausea and vomiting. She states that she has not had any abdominal pain. She has not vomited since arriving in the ED. She denies fevers or chills. The patient states that the surgeon related to her that there was an abnormality with her pancreas and that he stated that she needed to have this investigated by ERCP   Active Problems: SBO - pt is clinically improving, denies emesis over the past 24 hours - will advance diet to full liquid - continue analgesia and antiemetics as needed - appreciate GYN onc assistance  Anemia of chronic disease  - no signs of active bleeding - Hg and Hct remains stable and at baseline  - repeat CBC in AM Ovarian cancer  - appreciate Dr. Serita Grit input  DVT prophylaxis  - Lovenox subQ  Consultants:  GYN oncology  Procedures/Studies: Ct Abdomen Pelvis W Contrast  07/05/2014  Prominent fluid-filled dilated proximal small bowel. The distal small bowel is decompressed. A discrete transition zone cannot be identified. Given the lack of distal small bowel distention, a component of proximal to mid small bowel obstruction is favored over adynamic ileus.  Intraperitoneal free fluid, more so on the left than the right.  Interval or section of the complex cyst in the pelvis.  Intraperitoneal free air is identified, but this is not unexpected 4 days from surgery.    Antibiotics:  None  Code Status: Full Family Communication: Pt at bedside Disposition Plan: Home when medically stable, possibly in 24-48 hours   HPI/Subjective: No events overnight.    Objective: Filed Vitals:   07/05/14 1002 07/05/14 1318 07/05/14 2134 07/06/14 0540  BP: 114/66 111/66 131/64 128/53  Pulse: 103 105 92 100  Temp:  97.9 F (36.6 C) 98.7 F (37.1 C) 98.7 F (37.1 C)  TempSrc:  Oral Oral Oral  Resp:  16 18 16   Height:      Weight:      SpO2:  96% 97% 98%    Intake/Output Summary (Last 24 hours) at 07/06/14 0736 Last data filed at 07/06/14 0540  Gross per 24 hour  Intake 3371.33 ml  Output    100 ml  Net 3271.33 ml    Exam:   General:  Pt is alert, follows commands appropriately, not in acute distress  Cardiovascular: Regular rate and rhythm, S1/S2, no murmurs, no rubs, no gallops  Respiratory: Clear to auscultation bilaterally, no wheezing, no crackles, no rhonchi  Abdomen: Soft, non tender, non distended, bowel sounds present, no guarding  Extremities: No edema, pulses DP and PT palpable bilaterally  Neuro: Grossly nonfocal  Data Reviewed: Basic Metabolic Panel:  Recent Labs Lab 07/02/14 0535 07/04/14 2358 07/05/14 0700  NA 136* 139 138  K 4.0 4.8 4.2  CL 99 100 102  CO2 29 24 25   GLUCOSE 238* 192* 169*  BUN 6 8 8   CREATININE 0.83 0.57 0.53  CALCIUM 8.4 9.2 8.4   Liver Function Tests:  Recent Labs Lab 07/04/14 2358 07/05/14 0700  AST 25 26  ALT 37* 33  ALKPHOS 124* 115  BILITOT 0.9 0.6  PROT 6.6 5.7*  ALBUMIN 2.9* 2.4*    Recent Labs Lab 07/04/14 2358  LIPASE 7*   CBC:  Recent Labs Lab 07/02/14 0535 07/04/14 2358 07/05/14 0700  WBC 9.1 9.7 7.6  NEUTROABS  --  7.4  --   HGB 10.7* 11.5* 10.5*  HCT 32.9* 35.2* 32.4*  MCV 87.5 87.6 86.9  PLT 187 278 255    CBG:  Recent Labs Lab 07/03/14 0740 07/05/14 0751 07/05/14 1251 07/05/14 1711 07/05/14 2150  GLUCAP 208* 151* 135* 134* 120*    Scheduled Meds: . chlorhexidine  15 mL Mouth Rinse BID  . enoxaparin  40 mg Subcutaneous Q24H  . etodolac  600 mg Oral 3 times per day  . famotidine (PEPCID) IV  20 mg Intravenous Q12H  . insulin  aspart  0-9 Units Subcutaneous TID WC  . losartan  100 mg Oral q morning - 10a  . morphine  30 mg Oral Q12H   Continuous Infusions: . sodium chloride 125 mL/hr at 07/06/14 0540    Faye Ramsay, MD  [facility] Pager [phone]  If 7PM-7AM, please contact night-coverage [email or web address] Password [facility] 07/06/2014, 7:36 AM   LOS: 2 days

## 2014-07-06 NOTE — Progress Notes (Signed)
Pt ambulating through the halls with family, tolerated well.

## 2014-07-07 ENCOUNTER — Telehealth: Payer: Managed Care, Other (non HMO) | Admitting: *Deleted

## 2014-07-07 ENCOUNTER — Encounter (HOSPITAL_COMMUNITY): Payer: Self-pay | Admitting: *Deleted

## 2014-07-07 ENCOUNTER — Other Ambulatory Visit: Payer: Self-pay | Admitting: *Deleted

## 2014-07-07 DIAGNOSIS — I10 Essential (primary) hypertension: Secondary | ICD-10-CM

## 2014-07-07 DIAGNOSIS — E119 Type 2 diabetes mellitus without complications: Secondary | ICD-10-CM

## 2014-07-07 DIAGNOSIS — C569 Malignant neoplasm of unspecified ovary: Secondary | ICD-10-CM

## 2014-07-07 DIAGNOSIS — D649 Anemia, unspecified: Secondary | ICD-10-CM

## 2014-07-07 DIAGNOSIS — R1084 Generalized abdominal pain: Secondary | ICD-10-CM

## 2014-07-07 LAB — CBC
HCT: 31 % — ABNORMAL LOW (ref 36.0–46.0)
Hemoglobin: 10 g/dL — ABNORMAL LOW (ref 12.0–15.0)
MCH: 28.4 pg (ref 26.0–34.0)
MCHC: 32.3 g/dL (ref 30.0–36.0)
MCV: 88.1 fL (ref 78.0–100.0)
Platelets: 262 10*3/uL (ref 150–400)
RBC: 3.52 MIL/uL — ABNORMAL LOW (ref 3.87–5.11)
RDW: 14.2 % (ref 11.5–15.5)
WBC: 7.5 10*3/uL (ref 4.0–10.5)

## 2014-07-07 LAB — BASIC METABOLIC PANEL
Anion gap: 15 (ref 5–15)
BUN: 11 mg/dL (ref 6–23)
CO2: 22 mEq/L (ref 19–32)
CREATININE: 0.44 mg/dL — AB (ref 0.50–1.10)
Calcium: 8.5 mg/dL (ref 8.4–10.5)
Chloride: 101 mEq/L (ref 96–112)
Glucose, Bld: 153 mg/dL — ABNORMAL HIGH (ref 70–99)
POTASSIUM: 4.1 meq/L (ref 3.7–5.3)
Sodium: 138 mEq/L (ref 137–147)

## 2014-07-07 LAB — GLUCOSE, CAPILLARY
GLUCOSE-CAPILLARY: 105 mg/dL — AB (ref 70–99)
GLUCOSE-CAPILLARY: 119 mg/dL — AB (ref 70–99)
Glucose-Capillary: 131 mg/dL — ABNORMAL HIGH (ref 70–99)
Glucose-Capillary: 105 mg/dL — ABNORMAL HIGH (ref 70–99)

## 2014-07-07 NOTE — Progress Notes (Signed)
Pt ambulating around the unit with family, tolerating well. After IV increased to 125 ml/hr pt states feeling better

## 2014-07-07 NOTE — Progress Notes (Signed)
Subjective: "set back yesterday and overnight" Patient reports several episodes of nausea and emesis after advancing her diet to full liquids yesterday. Her last episode of emesis was during the night. During this time she had stopped passing flatus. She has resumed flatus this morning and has had no further emesis.  No BM since admission. Denies pain.  Objective: Vital signs in last 24 hours: Temp:  [98 F (36.7 C)-98.2 F (36.8 C)] 98 F (36.7 C) (07/27 0440) Pulse Rate:  [94] 94 (07/27 0440) Resp:  [16-18] 16 (07/27 0440) BP: (116-141)/(54) 116/54 mmHg (07/27 0440) SpO2:  [98 %-99 %] 99 % (07/27 0440) Weight:  [165 lb 8 oz (75.07 kg)] 165 lb 8 oz (75.07 kg) (07/27 0440)  Intake/Output from previous day: 07/26 0701 - 07/27 0700 In: 5386.3 [P.O.:1188; I.V.:4148.3] Out: -  Intake/Output this shift:    Abdominal exam: distended, soft, nontender, resonant to percussion, no rebound. Hypoactive bowel sounds on auscultation. Incision c/d/i with dressing. Ext: NT, negative Homan's sign Results for orders placed during the hospital encounter of 07/04/14 (from the past 24 hour(s))  GLUCOSE, CAPILLARY     Status: Abnormal   Collection Time    07/06/14 11:35 AM      Result Value Ref Range   Glucose-Capillary 150 (*) 70 - 99 mg/dL  GLUCOSE, CAPILLARY     Status: Abnormal   Collection Time    07/06/14  5:45 PM      Result Value Ref Range   Glucose-Capillary 110 (*) 70 - 99 mg/dL  GLUCOSE, CAPILLARY     Status: Abnormal   Collection Time    07/06/14  9:17 PM      Result Value Ref Range   Glucose-Capillary 151 (*) 70 - 99 mg/dL   Comment 1 Notify RN    CBC     Status: Abnormal   Collection Time    07/07/14 12:25 AM      Result Value Ref Range   WBC 7.5  4.0 - 10.5 K/uL   RBC 3.52 (*) 3.87 - 5.11 MIL/uL   Hemoglobin 10.0 (*) 12.0 - 15.0 g/dL   HCT 31.0 (*) 36.0 - 46.0 %   MCV 88.1  78.0 - 100.0 fL   MCH 28.4  26.0 - 34.0 pg   MCHC 32.3  30.0 - 36.0 g/dL   RDW 14.2  11.5 - 15.5  %   Platelets 262  150 - 400 K/uL  BASIC METABOLIC PANEL     Status: Abnormal   Collection Time    07/07/14 12:25 AM      Result Value Ref Range   Sodium 138  137 - 147 mEq/L   Potassium 4.1  3.7 - 5.3 mEq/L   Chloride 101  96 - 112 mEq/L   CO2 22  19 - 32 mEq/L   Glucose, Bld 153 (*) 70 - 99 mg/dL   BUN 11  6 - 23 mg/dL   Creatinine, Ser 0.44 (*) 0.50 - 1.10 mg/dL   Calcium 8.5  8.4 - 10.5 mg/dL   GFR calc non Af Amer >90  >90 mL/min   GFR calc Af Amer >90  >90 mL/min   Anion gap 15  5 - 15  GLUCOSE, CAPILLARY     Status: Abnormal   Collection Time    07/07/14  8:12 AM      Result Value Ref Range   Glucose-Capillary 131 (*) 70 - 99 mg/dL     Scheduled Meds: . chlorhexidine  15 mL Mouth  Rinse BID  . docusate sodium  100 mg Oral Daily  . enoxaparin  40 mg Subcutaneous Q24H  . etodolac  600 mg Oral 3 times per day  . famotidine (PEPCID) IV  20 mg Intravenous Q12H  . insulin aspart  0-9 Units Subcutaneous TID WC  . losartan  100 mg Oral q morning - 10a  . morphine  30 mg Oral Q12H  . senna-docusate  1 tablet Oral BID  . tamsulosin  0.4 mg Oral Daily   Continuous Infusions: . sodium chloride 1,000 mL (07/06/14 1635)   PRN Meds:baclofen, HYDROmorphone (DILAUDID) injection, LORazepam, ondansetron (ZOFRAN) IV, ondansetron, oxyCODONE-acetaminophen  Assessment/Plan: Principal Problem:   SBO (small bowel obstruction) - persistent despite advancing diet. Continue inpatient management. I recommend restating NPO status and increasing IVF's back to full maintenance rate.  Encouraged ambulation. I recommend placement NGT if she has persistent emesis today despite NPO status.  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 I  will remove her staples tomorrow if she is still an inpatient.    Normocytic anemia   Donaciano Eva, MD    LOS: 3 days   Donaciano Eva

## 2014-07-07 NOTE — Progress Notes (Signed)
Progress Note   Kristin Meadows JJH:[id number] DOB: 09-08-1956 DOA: 07/04/2014 PCP: Ernestene Kiel, MD   Brief Narrative:   Kristin Meadows is an 58 y.o. female who underwent exploratory laparotomy, right salpingo-oophorectomy, omentectomy and tumor debulking 07/01/14 who presented to the hospital on 07/04/14 with a chief complaint of nausea and vomiting. Upon initial evaluation, the patient was found to have a small bowel obstruction, and was referred for inpatient management.  Assessment/Plan:   Principal Problem:   SBO (small bowel obstruction)  Treated conservatively with bowel rest, IV fluids, analgesics, and anti-emetics.  Diet successfully advanced to full liquid 07/06/14, but due to recurrent symptoms over the past 24 hours, her surgeon is recommending further bowel rest.  Being followed by GYN oncology.  Active Problems:   Type II or unspecified type diabetes mellitus without mention of complication, not stated as uncontrolled  Metformin currently on hold.  Being managed by insulin sensitive SSI. CBG range 110-151.    Hypertension  Continue Cozaar. Lasix on hold.    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 Likely anemia of chronic disease.    DVT Prophylaxis  Code Status: Full. Family Communication: Husband/family updated at bedside. Disposition Plan: Home when stable.   IV Access:    Peripheral IV   Procedures and diagnostic studies:    CT scan of the abdomen and pelvis 07/05/14: Prominent fluid-filled dilated proximal small bowel. Distal small bowel decompressed. Intraperitoneal free fluid, more so on the left. Interval resection of the complex cyst in the pelvis.   Medical Consultants:    Dr. Everitt Amber, GYN-ONC   Other Consultants:    None.   Anti-Infectives:     None.  Subjective:   Kristin Meadows had nausea/vomiting over the past 24 hours. This morning, she had another episode, but currently feels well and says she is passing some flatus now. Some abdominal discomfort related to her recent surgery. Takes the OxyContin is making her itch.  Objective:    Filed Vitals:   07/05/14 2134 07/06/14 0540 07/06/14 2122 07/07/14 0440  BP: 131/64 128/53 141/54 116/54  Pulse: 92 100 94 94  Temp: 98.7 F (37.1 C) 98.7 F (37.1 C) 98.2 F (36.8 C) 98 F (36.7 C)  TempSrc: Oral Oral Oral Oral  Resp: 18 16 18 16   Height:      Weight:    75.07 kg (165 lb 8 oz)  SpO2: 97% 98% 98% 99%    Intake/Output Summary (Last 24 hours) at 07/07/14 0757 Last data filed at 07/06/14 2123  Gross per 24 hour  Intake 5386.33 ml  Output      0 ml  Net 5386.33 ml    Exam: Gen:  NAD Cardiovascular:  RRR, 2/6 systolic ejection murmur left upper sternal border Respiratory:  Lungs CTAB Gastrointestinal:  Abdomen soft, NT/ND, + BS Extremities:  No C/E/C   Data Reviewed:    Labs: Basic Metabolic Panel:  Recent Labs Lab 07/02/14 0535 07/04/14 2358 07/05/14 0700 07/07/14 0025  NA 136* 139 138 138  K 4.0 4.8 4.2 4.1  CL 99 100 102 101  CO2 29 24 25 22   GLUCOSE 238* 192* 169* 153*  BUN 6 8 8 11   CREATININE 0.83 0.57 0.53 0.44*  CALCIUM 8.4 9.2 8.4 8.5   GFR Estimated Creatinine Clearance: 74.5 ml/min (by C-G formula based on Cr of 0.44).  Liver Function Tests:  Recent Labs Lab 07/04/14 2358 07/05/14 0700  AST 25 26  ALT 37* 33  ALKPHOS 124* 115  BILITOT 0.9 0.6  PROT 6.6 5.7*  ALBUMIN 2.9* 2.4*    Recent Labs Lab 07/04/14 2358  LIPASE 7*   CBC:  Recent Labs Lab 07/02/14 0535 07/04/14 2358 07/05/14 0700 07/07/14 0025  WBC 9.1 9.7 7.6 7.5  NEUTROABS  --  7.4  --   --   HGB 10.7* 11.5* 10.5* 10.0*  HCT 32.9* 35.2* 32.4* 31.0*  MCV 87.5 87.6 86.9 88.1  PLT 187 278 255 262   CBG:  Recent Labs Lab 07/05/14 2150  07/06/14 0808 07/06/14 1135 07/06/14 1745 07/06/14 2117  GLUCAP 120* 111* 150* 110* 151*   Sepsis Labs:  Recent Labs Lab 07/02/14 0535 07/04/14 2358 07/05/14 0053 07/05/14 0700 07/07/14 0025  WBC 9.1 9.7  --  7.6 7.5  LATICACIDVEN  --   --  1.88  --   --    Microbiology No results found for this or any previous visit (from the past 240 hour(s)).   Medications:   . chlorhexidine  15 mL Mouth Rinse BID  . docusate sodium  100 mg Oral Daily  . enoxaparin  40 mg Subcutaneous Q24H  . etodolac  600 mg Oral 3 times per day  . famotidine (PEPCID) IV  20 mg Intravenous Q12H  . insulin aspart  0-9 Units Subcutaneous TID WC  . losartan  100 mg Oral q morning - 10a  . morphine  30 mg Oral Q12H  . senna-docusate  1 tablet Oral BID  . tamsulosin  0.4 mg Oral Daily   Continuous Infusions: . sodium chloride 1,000 mL (07/06/14 1635)    Time spent: 25 minutes.   LOS: 3 days   [facility] Pager [phone]. If unable to reach me by pager, please call my cell phone at [phone].  *Please refer to [email or web address], password [email or web address] to get updated schedule on who will round on this patient, as hospitalists switch teams weekly. If 7PM-7AM, please contact night-coverage at [email or web address], password [email or web address] for any overnight needs.  07/07/2014, 7:57 AM    **Disclaimer: This note was dictated with voice recognition software. Similar sounding words can inadvertently be transcribed and this note may contain transcription errors which may not have been corrected upon publication of note.**  Information printed out and reviewed with the patient/family:     In an effort to keep you and your family informed about your hospital stay, I am providing you with this information sheet. If you or your family have any questions, please do not hesitate to have the nursing staff page me to set up a meeting time.  Also note that the hospitalist doctors typically change on Tuesdays or Wednesdays  to a different hospitalist doctor.  Mischele Drumgoole 07/07/2014 3 (Number of days in the hospital)  Treatment team:  Dr. Jacquelynn Cree, Hospitalist (Internist)  Dr. Everitt Amber, GYN Oncolgist  Pertinent labs / studies:  CT scan of the abdomen and pelvis 07/05/14: Prominent fluid-filled dilated proximal small bowel. Distal small bowel decompressed. Intraperitoneal free fluid, more so on the left. Interval resection of the complex cyst in the pelvis.  Glucose 153, other electrolytes normal. White blood cell count 7.5, hemoglobin 10.   Principle Diagnosis: Postoperative small bowel obstruction   Plan for today: Increase activity as walking sometimes helps alleviate bowel obstructions. Continue liquids as tolerated. We'll check another x-ray if your symptoms persist through the day.  Anticipated discharge date: Depends on resolution of your bowel obstruction.

## 2014-07-07 NOTE — Telephone Encounter (Signed)
Order for EUS entered per MD

## 2014-07-08 ENCOUNTER — Other Ambulatory Visit: Payer: Self-pay | Admitting: *Deleted

## 2014-07-08 ENCOUNTER — Other Ambulatory Visit (HOSPITAL_COMMUNITY): Payer: Self-pay | Admitting: Gynecologic Oncology

## 2014-07-08 ENCOUNTER — Telehealth: Payer: Self-pay | Admitting: Oncology

## 2014-07-08 ENCOUNTER — Other Ambulatory Visit: Payer: Self-pay | Admitting: Gynecologic Oncology

## 2014-07-08 ENCOUNTER — Other Ambulatory Visit (HOSPITAL_COMMUNITY): Payer: Self-pay | Admitting: Internal Medicine

## 2014-07-08 DIAGNOSIS — B373 Candidiasis of vulva and vagina: Secondary | ICD-10-CM

## 2014-07-08 DIAGNOSIS — C569 Malignant neoplasm of unspecified ovary: Secondary | ICD-10-CM

## 2014-07-08 DIAGNOSIS — B3731 Acute candidiasis of vulva and vagina: Secondary | ICD-10-CM | POA: Diagnosis present

## 2014-07-08 DIAGNOSIS — K869 Disease of pancreas, unspecified: Secondary | ICD-10-CM

## 2014-07-08 LAB — BASIC METABOLIC PANEL
Anion gap: 14 (ref 5–15)
BUN: 10 mg/dL (ref 6–23)
CALCIUM: 8.1 mg/dL — AB (ref 8.4–10.5)
CO2: 21 mEq/L (ref 19–32)
Chloride: 106 mEq/L (ref 96–112)
Creatinine, Ser: 0.43 mg/dL — ABNORMAL LOW (ref 0.50–1.10)
GFR calc Af Amer: >90 mL/min (ref >90)
Glucose, Bld: 110 mg/dL — ABNORMAL HIGH (ref 70–99)
POTASSIUM: 3.8 meq/L (ref 3.7–5.3)
SODIUM: 141 meq/L (ref 137–147)

## 2014-07-08 LAB — MAGNESIUM
MAGNESIUM: 1.9 mg/dL (ref 1.5–2.5)
Magnesium: 1.9 mg/dL (ref 1.5–2.5)

## 2014-07-08 LAB — PHOSPHORUS: PHOSPHORUS: 2.3 mg/dL (ref 2.3–4.6)

## 2014-07-08 LAB — GLUCOSE, CAPILLARY
GLUCOSE-CAPILLARY: 115 mg/dL — AB (ref 70–99)
Glucose-Capillary: 102 mg/dL — ABNORMAL HIGH (ref 70–99)
Glucose-Capillary: 121 mg/dL — ABNORMAL HIGH (ref 70–99)
Glucose-Capillary: 139 mg/dL — ABNORMAL HIGH (ref 70–99)

## 2014-07-08 MED ORDER — KCL IN DEXTROSE-NACL 20-5-0.45 MEQ/L-%-% IV SOLN
INTRAVENOUS | Status: DC
  Administered 2014-07-08: 23:00:00 via INTRAVENOUS
  Administered 2014-07-08: 1000 mL via INTRAVENOUS
  Filled 2014-07-08 (×4): qty 1000

## 2014-07-08 MED ORDER — POTASSIUM CHLORIDE IN NACL 20-0.45 MEQ/L-% IV SOLN
INTRAVENOUS | Status: DC
  Filled 2014-07-08: qty 1000

## 2014-07-08 MED ORDER — KCL IN DEXTROSE-NACL 20-5-0.45 MEQ/L-%-% IV SOLN
INTRAVENOUS | Status: DC
Start: 2014-07-08 — End: 2014-07-08

## 2014-07-08 MED ORDER — FLUCONAZOLE 150 MG PO TABS
150.0000 mg | ORAL_TABLET | Freq: Every day | ORAL | Status: AC
  Administered 2014-07-08: 150 mg via ORAL
  Filled 2014-07-08: qty 1

## 2014-07-08 NOTE — Progress Notes (Signed)
Subjective: "feeling better today" No emesis or nausea for 24 hours. Liquid bowel movement x 1. Continues to pass flatus. Denies pain. Symptoms of a yeast infection/vaginitis.  Objective: Vital signs in last 24 hours: Temp:  [97.4 F (36.3 C)-98.2 F (36.8 C)] 98.2 F (36.8 C) (07/27 2010) Pulse Rate:  [90-91] 90 (07/27 2010) Resp:  [16-18] 18 (07/27 2010) BP: (108-118)/(46-56) 108/56 mmHg (07/27 2010) SpO2:  [98 %] 98 % (07/27 2010)  Intake/Output from previous day: 07/27 0701 - 07/28 0700 In: 2660.8 [P.O.:175; I.V.:2335.8] Out: -  Intake/Output this shift:    Abdominal exam: less distended, soft, nontender, resonant to percussion, no rebound. Normoactive bowel sounds on auscultation. Incision c/d/i with dressing. Ext: NT, negative Homan's sign Results for orders placed during the hospital encounter of 07/04/14 (from the past 24 hour(s))  GLUCOSE, CAPILLARY     Status: Abnormal   Collection Time    07/07/14 12:29 PM      Result Value Ref Range   Glucose-Capillary 105 (*) 70 - 99 mg/dL  GLUCOSE, CAPILLARY     Status: Abnormal   Collection Time    07/07/14  4:23 PM      Result Value Ref Range   Glucose-Capillary 119 (*) 70 - 99 mg/dL   Comment 1 Documented in Chart     Comment 2 Notify RN    GLUCOSE, CAPILLARY     Status: Abnormal   Collection Time    07/07/14  9:34 PM      Result Value Ref Range   Glucose-Capillary 105 (*) 70 - 99 mg/dL   Comment 1 Notify RN    BASIC METABOLIC PANEL     Status: Abnormal   Collection Time    07/08/14  4:22 AM      Result Value Ref Range   Sodium 141  137 - 147 mEq/L   Potassium 3.8  3.7 - 5.3 mEq/L   Chloride 106  96 - 112 mEq/L   CO2 21  19 - 32 mEq/L   Glucose, Bld 110 (*) 70 - 99 mg/dL   BUN 10  6 - 23 mg/dL   Creatinine, Ser 0.43 (*) 0.50 - 1.10 mg/dL   Calcium 8.1 (*) 8.4 - 10.5 mg/dL   GFR calc non Af Amer >90  >90 mL/min   GFR calc Af Amer >90  >90 mL/min   Anion gap 14  5 - 15  MAGNESIUM     Status: None   Collection Time    07/08/14  4:22 AM      Result Value Ref Range   Magnesium 1.9  1.5 - 2.5 mg/dL  PHOSPHORUS     Status: None   Collection Time    07/08/14  4:22 AM      Result Value Ref Range   Phosphorus 2.3  2.3 - 4.6 mg/dL  GLUCOSE, CAPILLARY     Status: Abnormal   Collection Time    07/08/14  7:52 AM      Result Value Ref Range   Glucose-Capillary 102 (*) 70 - 99 mg/dL   Comment 1 Documented in Chart     Comment 2 Notify RN       Scheduled Meds: . chlorhexidine  15 mL Mouth Rinse BID  . docusate sodium  100 mg Oral Daily  . enoxaparin  40 mg Subcutaneous Q24H  . etodolac  600 mg Oral 3 times per day  . famotidine (PEPCID) IV  20 mg Intravenous Q12H  . insulin aspart  0-9 Units  Subcutaneous TID WC  . losartan  100 mg Oral q morning - 10a  . senna-docusate  1 tablet Oral BID  . tamsulosin  0.4 mg Oral Daily   Continuous Infusions: . sodium chloride 125 mL/hr at 07/08/14 0530   PRN Meds:baclofen, HYDROmorphone (DILAUDID) injection, LORazepam, ondansetron (ZOFRAN) IV, ondansetron, oxyCODONE-acetaminophen  Assessment/Plan: Principal Problem:   SBO (small bowel obstruction) - improving. Evidence for return of bowel function.  Will advance diet to clears, decrease IVF to 50cc/hr (d50.5NS +20K).  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 Staples out today.  Normocytic anemia Candidal vaginitis - will prescribe diflucan as single dose.   Donaciano Eva, MD    LOS: 4 days   Donaciano Eva

## 2014-07-08 NOTE — Telephone Encounter (Signed)
C/D 07/08/14 for appt. 07/21/14

## 2014-07-08 NOTE — Plan of Care (Signed)
Problem: Phase III Progression Outcomes Goal: Voiding independently Outcome: Completed/Met Date Met:  07/08/14 Patient ambulates to the BR to void

## 2014-07-08 NOTE — Progress Notes (Signed)
Pt ambulating around the unit with husband

## 2014-07-08 NOTE — Progress Notes (Signed)
Progress Note   Kristin Meadows LFY:[id number] DOB: May 30, 1956 DOA: 07/04/2014 PCP: Ernestene Kiel, MD   Brief Narrative:   Kristin Meadows is an 58 y.o. female who underwent exploratory laparotomy, right salpingo-oophorectomy, omentectomy and tumor debulking 07/01/14 who presented to the hospital on 07/04/14 with a chief complaint of nausea and vomiting. Upon initial evaluation, the patient was found to have a small bowel obstruction, and was referred for inpatient management.  Assessment/Plan:   Principal Problem:   SBO (small bowel obstruction)  Treated conservatively with bowel rest, IV fluids, analgesics, and anti-emetics.  Resolving. Advance diet to clear liquids today.  Being followed by GYN oncology.  Active Problems:   Vaginal yeast infection  Diflucan x1 ordered.    Type II or unspecified type diabetes mellitus without mention of complication, not stated as uncontrolled  Metformin currently on hold.  Being managed by insulin sensitive SSI. CBG range 102-131.    Hypertension  Continue Cozaar. Lasix on hold.    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     Normocytic anemia  Mild. Likely anemia of chronic disease.    DVT Prophylaxis  Code Status: Full. Family Communication: Husband/family updated at bedside. Disposition Plan: Home when stable.   IV Access:    Peripheral IV   Procedures and diagnostic studies:    CT scan of the abdomen and pelvis 07/05/14: Prominent fluid-filled dilated proximal small bowel. Distal small bowel decompressed. Intraperitoneal free fluid, more so on the left. Interval resection of the complex cyst in the pelvis.   Medical Consultants:    Dr. Everitt Amber, GYN-ONC   Other Consultants:    None.   Anti-Infectives:    Diflucan x17/28/15.  Subjective:   Kristin Meadows has not had  nausea/vomiting over the past 24 hours. She had a liquid bowel movement and is now passing flatus. No significant pain but does report some vaginal irritation.  Objective:    Filed Vitals:   07/06/14 2122 07/07/14 0440 07/07/14 1351 07/07/14 2010  BP: 141/54 116/54 118/46 108/56  Pulse: 94 94 91 90  Temp: 98.2 F (36.8 C) 98 F (36.7 C) 97.4 F (36.3 C) 98.2 F (36.8 C)  TempSrc: Oral Oral Oral Oral  Resp: 18 16 16 18   Height:      Weight:  75.07 kg (165 lb 8 oz)    SpO2: 98% 99% 98% 98%    Intake/Output Summary (Last 24 hours) at 07/08/14 0806 Last data filed at 07/08/14 0000  Gross per 24 hour  Intake 2660.84 ml  Output      0 ml  Net 2660.84 ml    Exam: Gen:  NAD Cardiovascular:  RRR, 2/6 systolic ejection murmur left upper sternal border Respiratory:  Lungs CTAB Gastrointestinal:  Abdomen soft, NT/ND, + BS Extremities:  No C/E/C   Data Reviewed:    Labs: Basic Metabolic Panel:  Recent Labs Lab 07/02/14 0535 07/04/14 2358 07/05/14 0700 07/07/14 0025 07/08/14 0422  NA 136* 139 138 138 141  K 4.0 4.8 4.2 4.1 3.8  CL 99 100 102 101 106  CO2 29 24 25 22 21   GLUCOSE 238* 192* 169* 153* 110*  BUN 6 8 8 11 10   CREATININE 0.83 0.57 0.53 0.44* 0.43*  CALCIUM 8.4 9.2 8.4 8.5 8.1*  MG  --   --   --   --  1.9  PHOS  --   --   --   --  2.3   GFR Estimated Creatinine Clearance: 74.5 ml/min (by C-G formula based on Cr of 0.43). Liver Function Tests:  Recent Labs Lab 07/04/14 2358 07/05/14 0700  AST 25 26  ALT 37* 33  ALKPHOS 124* 115  BILITOT 0.9 0.6  PROT 6.6 5.7*  ALBUMIN 2.9* 2.4*    Recent Labs Lab 07/04/14 2358  LIPASE 7*   CBC:  Recent Labs Lab 07/02/14 0535 07/04/14 2358 07/05/14 0700 07/07/14 0025  WBC 9.1 9.7 7.6 7.5  NEUTROABS  --  7.4  --   --   HGB 10.7* 11.5* 10.5* 10.0*  HCT 32.9* 35.2* 32.4* 31.0*  MCV 87.5 87.6 86.9 88.1  PLT 187 278 255 262   CBG:  Recent Labs Lab 07/07/14 0812 07/07/14 1229 07/07/14 1623  07/07/14 2134 07/08/14 0752  GLUCAP 131* 105* 119* 105* 102*   Sepsis Labs:  Recent Labs Lab 07/02/14 0535 07/04/14 2358 07/05/14 0053 07/05/14 0700 07/07/14 0025  WBC 9.1 9.7  --  7.6 7.5  LATICACIDVEN  --   --  1.88  --   --    Microbiology No results found for this or any previous visit (from the past 240 hour(s)).   Medications:   . chlorhexidine  15 mL Mouth Rinse BID  . docusate sodium  100 mg Oral Daily  . enoxaparin  40 mg Subcutaneous Q24H  . etodolac  600 mg Oral 3 times per day  . famotidine (PEPCID) IV  20 mg Intravenous Q12H  . insulin aspart  0-9 Units Subcutaneous TID WC  . losartan  100 mg Oral q morning - 10a  . senna-docusate  1 tablet Oral BID  . tamsulosin  0.4 mg Oral Daily   Continuous Infusions: . sodium chloride 125 mL/hr at 07/08/14 0530    Time spent: 25 minutes.   LOS: 4 days   [facility] Pager [phone]. If unable to reach me by pager, please call my cell phone at [phone].  *Please refer to [email or web address], password [email or web address] to get updated schedule on who will round on this patient, as hospitalists switch teams weekly. If 7PM-7AM, please contact night-coverage at [email or web address], password [email or web address] for any overnight needs.  07/08/2014, 8:06 AM    **Disclaimer: This note was dictated with voice recognition software. Similar sounding words can inadvertently be transcribed and this note may contain transcription errors which may not have been corrected upon publication of note.**

## 2014-07-08 NOTE — Telephone Encounter (Signed)
S/W PATIENT AND GAVE CHEMO EDU 08/05 @ 10, MD APPT 08/10 @ 3 W/DR. LIVESAY.  REFERRING DR. Denman George DX-MALIGNANT NEOPLASM OF OVARY

## 2014-07-09 DIAGNOSIS — E876 Hypokalemia: Secondary | ICD-10-CM | POA: Diagnosis not present

## 2014-07-09 DIAGNOSIS — R112 Nausea with vomiting, unspecified: Secondary | ICD-10-CM

## 2014-07-09 LAB — COMPREHENSIVE METABOLIC PANEL
ALK PHOS: 160 U/L — AB (ref 39–117)
ALT: 24 U/L (ref 0–35)
AST: 10 U/L (ref 0–37)
Albumin: 2.1 g/dL — ABNORMAL LOW (ref 3.5–5.2)
Anion gap: 12 (ref 5–15)
BILIRUBIN TOTAL: 0.5 mg/dL (ref 0.3–1.2)
BUN: 3 mg/dL — ABNORMAL LOW (ref 6–23)
CHLORIDE: 102 meq/L (ref 96–112)
CO2: 22 meq/L (ref 19–32)
Calcium: 8.3 mg/dL — ABNORMAL LOW (ref 8.4–10.5)
Creatinine, Ser: 0.41 mg/dL — ABNORMAL LOW (ref 0.50–1.10)
GFR calc non Af Amer: >90 mL/min (ref >90)
GLUCOSE: 194 mg/dL — AB (ref 70–99)
POTASSIUM: 3.6 meq/L — AB (ref 3.7–5.3)
SODIUM: 136 meq/L — AB (ref 137–147)
Total Protein: 5.2 g/dL — ABNORMAL LOW (ref 6.0–8.3)

## 2014-07-09 LAB — CBC
HCT: 30.3 % — ABNORMAL LOW (ref 36.0–46.0)
HEMOGLOBIN: 9.8 g/dL — AB (ref 12.0–15.0)
MCH: 27.9 pg (ref 26.0–34.0)
MCHC: 32.3 g/dL (ref 30.0–36.0)
MCV: 86.3 fL (ref 78.0–100.0)
Platelets: 242 10*3/uL (ref 150–400)
RBC: 3.51 MIL/uL — AB (ref 3.87–5.11)
RDW: 14.1 % (ref 11.5–15.5)
WBC: 6.5 10*3/uL (ref 4.0–10.5)

## 2014-07-09 LAB — GLUCOSE, CAPILLARY
GLUCOSE-CAPILLARY: 134 mg/dL — AB (ref 70–99)
GLUCOSE-CAPILLARY: 139 mg/dL — AB (ref 70–99)
Glucose-Capillary: 176 mg/dL — ABNORMAL HIGH (ref 70–99)
Glucose-Capillary: 163 mg/dL — ABNORMAL HIGH (ref 70–99)

## 2014-07-09 MED ORDER — PROMETHAZINE HCL 25 MG/ML IJ SOLN
12.5000 mg | Freq: Four times a day (QID) | INTRAMUSCULAR | Status: DC | PRN
  Administered 2014-07-10 – 2014-07-13 (×8): 12.5 mg via INTRAVENOUS
  Filled 2014-07-09 (×8): qty 1

## 2014-07-09 MED ORDER — KETOROLAC TROMETHAMINE 30 MG/ML IJ SOLN
30.0000 mg | Freq: Four times a day (QID) | INTRAMUSCULAR | Status: AC
Start: 2014-07-09 — End: 2014-07-14
  Administered 2014-07-09 – 2014-07-14 (×20): 30 mg via INTRAVENOUS
  Filled 2014-07-09 (×7): qty 1
  Filled 2014-07-09: qty 2
  Filled 2014-07-09 (×4): qty 1
  Filled 2014-07-09: qty 2
  Filled 2014-07-09 (×9): qty 1

## 2014-07-09 MED ORDER — BOOST / RESOURCE BREEZE PO LIQD
1.0000 | Freq: Three times a day (TID) | ORAL | Status: DC
  Administered 2014-07-09 – 2014-07-13 (×3): 1 via ORAL

## 2014-07-09 MED ORDER — KCL IN DEXTROSE-NACL 40-5-0.45 MEQ/L-%-% IV SOLN
INTRAVENOUS | Status: AC
  Administered 2014-07-09 – 2014-07-10 (×3): via INTRAVENOUS
  Filled 2014-07-09 (×5): qty 1000

## 2014-07-09 MED ORDER — POTASSIUM CHLORIDE 2 MEQ/ML IV SOLN: INTRAVENOUS | Status: DC

## 2014-07-09 NOTE — Progress Notes (Signed)
Subjective: Reports flatus; vomiting this morning--bilious fluid.  Objective: Vital signs in last 24 hours: Temp:  [98.1 F (36.7 C)-98.3 F (36.8 C)] 98.1 F (36.7 C) (07/29 0612) Pulse Rate:  [86-88] 88 (07/29 0612) Resp:  [18-20] 20 (07/29 0612) BP: (140-149)/(64-80) 149/80 mmHg (07/29 0612) SpO2:  [96 %-99 %] 99 % (07/29 0612)  Intake/Output from previous day: 07/28 0701 - 07/29 0700 In: 535 [P.O.:360; I.V.:125] Out: -  Intake/Output this shift:    Abdomen: abnormal findings:  hypoactive bowel sounds and softly distended I: C/D/I Ext: NT, negative Homan's sign Results for orders placed during the hospital encounter of 07/04/14 (from the past 24 hour(s))  MAGNESIUM     Status: None   Collection Time    07/08/14 10:20 AM      Result Value Ref Range   Magnesium 1.9  1.5 - 2.5 mg/dL  GLUCOSE, CAPILLARY     Status: Abnormal   Collection Time    07/08/14 11:29 AM      Result Value Ref Range   Glucose-Capillary 115 (*) 70 - 99 mg/dL  GLUCOSE, CAPILLARY     Status: Abnormal   Collection Time    07/08/14  5:39 PM      Result Value Ref Range   Glucose-Capillary 121 (*) 70 - 99 mg/dL  GLUCOSE, CAPILLARY     Status: Abnormal   Collection Time    07/08/14  9:42 PM      Result Value Ref Range   Glucose-Capillary 139 (*) 70 - 99 mg/dL   Comment 1 Notify RN    CBC     Status: Abnormal   Collection Time    07/09/14  4:27 AM      Result Value Ref Range   WBC 6.5  4.0 - 10.5 K/uL   RBC 3.51 (*) 3.87 - 5.11 MIL/uL   Hemoglobin 9.8 (*) 12.0 - 15.0 g/dL   HCT 30.3 (*) 36.0 - 46.0 %   MCV 86.3  78.0 - 100.0 fL   MCH 27.9  26.0 - 34.0 pg   MCHC 32.3  30.0 - 36.0 g/dL   RDW 14.1  11.5 - 15.5 %   Platelets 242  150 - 400 K/uL  COMPREHENSIVE METABOLIC PANEL     Status: Abnormal   Collection Time    07/09/14  4:27 AM      Result Value Ref Range   Sodium 136 (*) 137 - 147 mEq/L   Potassium 3.6 (*) 3.7 - 5.3 mEq/L   Chloride 102  96 - 112 mEq/L   CO2 22  19 - 32 mEq/L   Glucose, Bld 194 (*) 70 - 99 mg/dL   BUN 3 (*) 6 - 23 mg/dL   Creatinine, Ser 0.41 (*) 0.50 - 1.10 mg/dL   Calcium 8.3 (*) 8.4 - 10.5 mg/dL   Total Protein 5.2 (*) 6.0 - 8.3 g/dL   Albumin 2.1 (*) 3.5 - 5.2 g/dL   AST 10  0 - 37 U/L   ALT 24  0 - 35 U/L   Alkaline Phosphatase 160 (*) 39 - 117 U/L   Total Bilirubin 0.5  0.3 - 1.2 mg/dL   GFR calc non Af Amer >90  >90 mL/min   GFR calc Af Amer >90  >90 mL/min   Anion gap 12  5 - 15     Scheduled Meds: . chlorhexidine  15 mL Mouth Rinse BID  . docusate sodium  100 mg Oral Daily  . enoxaparin  40 mg Subcutaneous  Q24H  . etodolac  600 mg Oral 3 times per day  . famotidine (PEPCID) IV  20 mg Intravenous Q12H  . insulin aspart  0-9 Units Subcutaneous TID WC  . losartan  100 mg Oral q morning - 10a  . senna-docusate  1 tablet Oral BID  . tamsulosin  0.4 mg Oral Daily   Continuous Infusions: . dextrose 5 % and 0.45 % NaCl with KCl 40 mEq/L     PRN Meds:baclofen, HYDROmorphone (DILAUDID) injection, LORazepam, ondansetron (ZOFRAN) IV, ondansetron, oxyCODONE-acetaminophen  Assessment/Plan: Principal Problem:   SBO (small bowel obstruction) Active Problems:   Type II or unspecified type diabetes mellitus without mention of complication, not stated as uncontrolled   Hypertension   Right ovarian epithelial cancer   Normocytic anemia   Vaginal yeast infection   Hypokalemia CBGs < 200 Electrolytes stable  -->Sips of clears -->ambulate -->daily weights    LOS: 5 days   JACKSON-MOORE,Finis Hendricksen A

## 2014-07-09 NOTE — Progress Notes (Signed)
Progress Note   Kristin Meadows GGE:[id number] DOB: [birth year] DOA: 07/04/2014 PCP: Ernestene Kiel, MD   Brief Narrative:   Kristin Meadows is an 58 y.o. female who underwent exploratory laparotomy, right salpingo-oophorectomy, omentectomy and tumor debulking 07/01/14 who presented to the hospital on 07/04/14 with a chief complaint of nausea and vomiting. Upon initial evaluation, the patient was found to have a small bowel obstruction, and was referred for inpatient management.  Assessment/Plan:   Principal Problem:   SBO (small bowel obstruction)  Treated conservatively with bowel rest, IV fluids, analgesics, and anti-emetics.  Resolving. Tolerated diet advancement to clear liquids 07/08/14.  Being followed by GYN oncology.  Active Problems:   Hypokalemia  Increase potassium in IV fluids.    Vaginal yeast infection  Status post Diflucan x1 07/08/14.    Type II or unspecified type diabetes mellitus without mention of complication, not stated as uncontrolled  Metformin currently on hold.  Being managed by insulin sensitive SSI. CBG range 102-139.    Hypertension  Continue Cozaar. Lasix on hold.    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     Normocytic anemia  Mild. Likely anemia of chronic disease.    DVT Prophylaxis  Continue Lovenox.  Code Status: Full. Family Communication: Husband/family updated at bedside. Disposition Plan: Home when stable.   IV Access:    Peripheral IV   Procedures and diagnostic studies:    CT scan of the abdomen and pelvis 07/05/14: Prominent fluid-filled dilated proximal small bowel. Distal small bowel decompressed. Intraperitoneal free fluid, more so on the left. Interval resection of the complex cyst in the pelvis.   Medical Consultants:    Dr. Everitt Amber, GYN-ONC   Other Consultants:     None.   Anti-Infectives:    Diflucan x17/28/15.  Subjective:   Kristin Meadows continues to have occasional bouts of nausea/vomiting. She had a liquid bowel movement yesterday and continues to pass flatus. No significant pain.  Objective:    Filed Vitals:   07/07/14 2010 07/08/14 1400 07/08/14 2142 07/09/14 0612  BP: 108/56 140/64 146/66 149/80  Pulse: 90 86 87 88  Temp: 98.2 F (36.8 C) 98.3 F (36.8 C) 98.3 F (36.8 C) 98.1 F (36.7 C)  TempSrc: Oral Oral Oral Oral  Resp: 18 18 20 20   Height:      Weight:      SpO2: 98% 98% 96% 99%    Intake/Output Summary (Last 24 hours) at 07/09/14 0742 Last data filed at 07/08/14 1832  Gross per 24 hour  Intake    535 ml  Output      0 ml  Net    535 ml    Exam: Gen:  NAD Cardiovascular:  RRR, 2/6 systolic ejection murmur left upper sternal border Respiratory:  Lungs CTAB Gastrointestinal:  Abdomen soft, NT/ND, + BS Extremities:  No C/E/C   Data Reviewed:    Labs: Basic Metabolic Panel:  Recent Labs Lab 07/04/14 2358 07/05/14 0700 07/07/14 0025 07/08/14 0422 07/08/14 1020 07/09/14 0427  NA 139 138 138 141  --  136*  K 4.8 4.2 4.1 3.8  --  3.6*  CL 100 102 101 106  --  102  CO2 24 25 22 21   --  22  GLUCOSE 192* 169* 153* 110*  --  194*  BUN 8 8 11 10   --  3*  CREATININE 0.57 0.53 0.44* 0.43*  --  0.41*  CALCIUM 9.2 8.4 8.5 8.1*  --  8.3*  MG  --   --   --  1.9 1.9  --   PHOS  --   --   --  2.3  --   --    GFR Estimated Creatinine Clearance: 74.5 ml/min (by C-G formula based on Cr of 0.41). Liver Function Tests:  Recent Labs Lab 07/04/14 2358 07/05/14 0700 07/09/14 0427  AST 25 26 10   ALT 37* 33 24  ALKPHOS 124* 115 160*  BILITOT 0.9 0.6 0.5  PROT 6.6 5.7* 5.2*  ALBUMIN 2.9* 2.4* 2.1*    Recent Labs Lab 07/04/14 2358  LIPASE 7*   CBC:  Recent Labs Lab 07/04/14 2358 07/05/14 0700 07/07/14 0025 07/09/14 0427  WBC 9.7 7.6 7.5 6.5  NEUTROABS 7.4  --   --   --   HGB 11.5* 10.5*  10.0* 9.8*  HCT 35.2* 32.4* 31.0* 30.3*  MCV 87.6 86.9 88.1 86.3  PLT 278 255 262 242   CBG:  Recent Labs Lab 07/07/14 2134 07/08/14 0752 07/08/14 1129 07/08/14 1739 07/08/14 2142  GLUCAP 105* 102* 115* 121* 139*   Sepsis Labs:  Recent Labs Lab 07/04/14 2358 07/05/14 0053 07/05/14 0700 07/07/14 0025 07/09/14 0427  WBC 9.7  --  7.6 7.5 6.5  LATICACIDVEN  --  1.88  --   --   --    Microbiology No results found for this or any previous visit (from the past 240 hour(s)).   Medications:   . chlorhexidine  15 mL Mouth Rinse BID  . docusate sodium  100 mg Oral Daily  . enoxaparin  40 mg Subcutaneous Q24H  . etodolac  600 mg Oral 3 times per day  . famotidine (PEPCID) IV  20 mg Intravenous Q12H  . insulin aspart  0-9 Units Subcutaneous TID WC  . losartan  100 mg Oral q morning - 10a  . senna-docusate  1 tablet Oral BID  . tamsulosin  0.4 mg Oral Daily   Continuous Infusions: . dextrose 5 % and 0.45 % NaCl with KCl 20 mEq/L 100 mL/hr at 07/08/14 2239    Time spent: 25 minutes.   LOS: 5 days   [facility] Pager [phone]. If unable to reach me by pager, please call my cell phone at [phone].  *Please refer to [email or web address], password [email or web address] to get updated schedule on who will round on this patient, as hospitalists switch teams weekly. If 7PM-7AM, please contact night-coverage at [email or web address], password [email or web address] for any overnight needs.  07/09/2014, 7:42 AM    **Disclaimer: This note was dictated with voice recognition software. Similar sounding words can inadvertently be transcribed and this note may contain transcription errors which may not have been corrected upon publication of note.**

## 2014-07-10 ENCOUNTER — Inpatient Hospital Stay (HOSPITAL_COMMUNITY): Payer: Managed Care, Other (non HMO)

## 2014-07-10 DIAGNOSIS — E440 Moderate protein-calorie malnutrition: Secondary | ICD-10-CM | POA: Insufficient documentation

## 2014-07-10 LAB — GLUCOSE, CAPILLARY
GLUCOSE-CAPILLARY: 111 mg/dL — AB (ref 70–99)
Glucose-Capillary: 112 mg/dL — ABNORMAL HIGH (ref 70–99)
Glucose-Capillary: 139 mg/dL — ABNORMAL HIGH (ref 70–99)
Glucose-Capillary: 167 mg/dL — ABNORMAL HIGH (ref 70–99)
Glucose-Capillary: 172 mg/dL — ABNORMAL HIGH (ref 70–99)

## 2014-07-10 LAB — LIPASE, BLOOD: Lipase: 60 U/L — ABNORMAL HIGH (ref 11–59)

## 2014-07-10 LAB — MAGNESIUM: Magnesium: 1.7 mg/dL (ref 1.5–2.5)

## 2014-07-10 MED ORDER — SODIUM CHLORIDE 0.9 % IJ SOLN
10.0000 mL | INTRAMUSCULAR | Status: DC | PRN
  Administered 2014-07-10: 10 mL

## 2014-07-10 MED ORDER — ACETAMINOPHEN 325 MG PO TABS: 650.0000 mg | ORAL_TABLET | Freq: Four times a day (QID) | ORAL | Status: DC | PRN

## 2014-07-10 MED ORDER — KCL IN DEXTROSE-NACL 40-5-0.45 MEQ/L-%-% IV SOLN
INTRAVENOUS | Status: DC
  Administered 2014-07-11: 22:00:00 via INTRAVENOUS
  Filled 2014-07-10 (×4): qty 1000

## 2014-07-10 MED ORDER — PHENOL 1.4 % MT LIQD
1.0000 | OROMUCOSAL | Status: DC | PRN
  Filled 2014-07-10: qty 177

## 2014-07-10 MED ORDER — DEXTROSE 5 % IV SOLN
20.0000 meq | Freq: Once | INTRAVENOUS | Status: AC
  Administered 2014-07-10: 20 meq via INTRAVENOUS
  Filled 2014-07-10: qty 4.55

## 2014-07-10 MED ORDER — TRACE MINERALS CR-CU-F-FE-I-MN-MO-SE-ZN IV SOLN
INTRAVENOUS | Status: AC
  Administered 2014-07-10: 20:00:00 via INTRAVENOUS
  Filled 2014-07-10: qty 1000

## 2014-07-10 MED ORDER — INSULIN ASPART 100 UNIT/ML ~~LOC~~ SOLN
0.0000 [IU] | SUBCUTANEOUS | Status: DC
  Administered 2014-07-10 – 2014-07-11 (×2): 3 [IU] via SUBCUTANEOUS
  Administered 2014-07-11: 17:00:00 via SUBCUTANEOUS
  Administered 2014-07-11 – 2014-07-12 (×4): 3 [IU] via SUBCUTANEOUS
  Administered 2014-07-12: 5 [IU] via SUBCUTANEOUS
  Administered 2014-07-12 (×2): 3 [IU] via SUBCUTANEOUS
  Administered 2014-07-13: 2 [IU] via SUBCUTANEOUS
  Administered 2014-07-13 (×2): 5 [IU] via SUBCUTANEOUS
  Administered 2014-07-13: 8 [IU] via SUBCUTANEOUS
  Administered 2014-07-13: 2 [IU] via SUBCUTANEOUS
  Administered 2014-07-14 (×3): 5 [IU] via SUBCUTANEOUS

## 2014-07-10 MED ORDER — FAT EMULSION 20 % IV EMUL
250.0000 mL | INTRAVENOUS | Status: AC
  Administered 2014-07-10: 250 mL via INTRAVENOUS
  Filled 2014-07-10: qty 250

## 2014-07-10 NOTE — Progress Notes (Signed)
Patient still vomiting after placement of NG and c/o upper abdominal pain. Dr Denman George notified and orders placed.

## 2014-07-10 NOTE — Progress Notes (Signed)
Peripherally Inserted Central Catheter/Midline Placement  The IV Nurse has discussed with the patient and/or persons authorized to consent for the patient, the purpose of this procedure and the potential benefits and risks involved with this procedure.  The benefits include less needle sticks, lab draws from the catheter and patient may be discharged home with the catheter.  Risks include, but not limited to, infection, bleeding, blood clot (thrombus formation), and puncture of an artery; nerve damage and irregular heat beat.  Alternatives to this procedure were also discussed.  PICC/Midline Placement Documentation        Kristin Meadows, Kristin Meadows 07/10/2014, 4:57 PM

## 2014-07-10 NOTE — Progress Notes (Signed)
PARENTERAL NUTRITION CONSULT NOTE - INITIAL  Pharmacy Consult for TPN Indication: prolonged ileus  Allergies  Allergen Reactions  . Other     Silk tape-RASH  . Penicillins     "Paralyzed me"  . Sulfa Antibiotics     Rash    Patient Measurements: Height: 5' 2.5" (158.8 cm) Weight: 164 lb 1.6 oz (74.435 kg) IBW/kg (Calculated) : 51.25 BMI: 29.5 Adjusted Body Weight: 60.5 kg Usual Weight:   Vital Signs: Temp: 98.4 F (36.9 C) (07/30 0522) Temp src: Oral (07/30 0522) BP: 143/79 mmHg (07/30 0522) Pulse Rate: 93 (07/30 0522) Intake/Output from previous day: 07/29 0701 - 07/30 0700 In: 320 [P.O.:320] Out: -  Intake/Output from this shift:    Labs:  Recent Labs  07/09/14 0427  WBC 6.5  HGB 9.8*  HCT 30.3*  PLT 242     Recent Labs  07/08/14 0422 07/08/14 1020 07/09/14 0427 07/10/14 1030  NA 141  --  136*  --   K 3.8  --  3.6*  --   CL 106  --  102  --   CO2 21  --  22  --   GLUCOSE 110*  --  194*  --   BUN 10  --  3*  --   CREATININE 0.43*  --  0.41*  --   CALCIUM 8.1*  --  8.3*  --   MG 1.9 1.9  --  1.7  PHOS 2.3  --   --   --   PROT  --   --  5.2*  --   ALBUMIN  --   --  2.1*  --   AST  --   --  10  --   ALT  --   --  24  --   ALKPHOS  --   --  160*  --   BILITOT  --   --  0.5  --    Estimated Creatinine Clearance: 74.1 ml/min (by C-G formula based on Cr of 0.41).    Recent Labs  07/09/14 2122 07/10/14 0725 07/10/14 1115  GLUCAP 139* 167* 111*    Medical History: Past Medical History  Diagnosis Date  . Hypertension   . Left bundle branch block (LBBB) age 58  . Anxiety   . Diabetes mellitus without complication     type 2  . GERD (gastroesophageal reflux disease)   . H/O hiatal hernia     small  . Arthritis   . Idiopathic neuropathy     biochemical  . Meningioma     x2    Medications:  Scheduled:  . chlorhexidine  15 mL Mouth Rinse BID  . docusate sodium  100 mg Oral Daily  . enoxaparin  40 mg Subcutaneous Q24H  .  famotidine (PEPCID) IV  20 mg Intravenous Q12H  . feeding supplement (RESOURCE BREEZE)  1 Container Oral TID BM  . insulin aspart  0-9 Units Subcutaneous TID WC  . ketorolac  30 mg Intravenous 4 times per day  . losartan  100 mg Oral q morning - 10a  . potassium phosphate IVPB (mEq)  20 mEq Intravenous Once  . senna-docusate  1 tablet Oral BID  . tamsulosin  0.4 mg Oral Daily   Infusions:  . dextrose 5 % and 0.45 % NaCl with KCl 40 mEq/L 125 mL/hr at 07/10/14 0528    Insulin Requirements in the past 24 hours: 4 units, Sens SSI TIDAC.  Current Nutrition: CL  IVF: D51/2NS 40KCL at 121ml/hr  Assessment: 58yo F w/ ovarian cancer underwent exploratory laparotomy, right salpingo-oophorectomy, omentectomy and tumor debulking 7/21. Admitted 7/24 with nausea and vomiting, suspected SBO. 7/30: Due to persistant N/V and abd xray showing ileus vs. obstruction, pharmacy was asked to start TPN. Noted hx of DM2. Prior to initial surgery she reported poor appetite and a 10lb weight loss over 2 weeks. At moderate risk for refeeding syndrome.   Glucose - 111-167  Electrolytes - 7/29: Low K. KPhos bolus ordered on 7/30. Mag wnl. Corr Ca 9.82.   LFTs - wnl  TGs -  Prealbumin -  Nutritional Goals:  RD recs: pending Estimate 77-102g/day protein, 1525-1830Kcal/day.  Clinimix E 5/15 at a goal rate of 34ml/hr + 20% fat emulsion at 58ml/hr to provide: 96g/day protein, 1843Kcal/day.  TPN Access: PICC (pending) TPN day#: 1  Plan: At 1800 today:  Start Clinimix E 5/15 at 17ml/hr.  20% fat emulsion at 77ml/hr.  Plan to advance as tolerated to the goal rate.  TNA to contain standard multivitamins and trace elements.  Reduce IVF to 29ml/hr.  Increase SSI to moderate q4h.   Full set of labs in the AM.  TNA lab panels on Mondays & Thursdays.  F/u daily.  Romeo Rabon, PharmD, pager [phone]. 07/10/2014,12:45 PM.

## 2014-07-10 NOTE — Progress Notes (Signed)
INITIAL NUTRITION ASSESSMENT  DOCUMENTATION CODES Per approved criteria  -Non-severe (moderate) malnutrition in the context of chronic illness  Pt meets criteria for moderate MALNUTRITION in the context of chronic illness as evidenced by PO intake <75% for > one month, approximately 5-6% body weight loss in one month.   INTERVENTION: -TPN per pharmacy -Diet advancement per MD -Continue with Resource Breeze po TID, each supplement provides 250 kcal and 9 grams of protein -RD to monitor  NUTRITION DIAGNOSIS: Inadequate oral intake related to decreased appetite/nausea/vomiting as evidenced by PO intake < 75%, wt loss.   Goal: TPN to meet >/= 90% of their estimated nutrition needs    Monitor:  TPN tolerance, diet order, total protein/energy intake, labs, weights, GI profile  Reason for Assessment: New TPN  58 y.o. female  Admitting Dx: SBO (small bowel obstruction)  ASSESSMENT: 58yo F w/ ovarian cancer underwent exploratory laparotomy, right salpingo-oophorectomy, omentectomy and tumor debulking 7/21. Admitted 7/24 with nausea and vomiting, suspected SBO.  -Pt endorsed an unintentional wt loss of 12- 14 lbs since 12/2013. Pharmacy noted pt had lost 10 (out of 14 lbs) two week prior to initial surgery on 7/21 (approximtately 5-6% body weight loss, significant for time frame). Current body weight may be elevated d/t fluids -Has had decreased PO intake d/t nausea, vomiting, and early satiety. Diet recall indicates pt normally consumes oatmeal or biscuit for breakfast, salad or sandwich for lunch and small light dinner. Portion sizes have significantly decreased in past 6 months. Does not snack or consume nutrition supplements at home -Has been unable to tolerate clear liquid diet d/t vomiting. Has Resource Breeze ordered, pt consuming < 25% d/t nausea/vomiting. MD noted pt with possible ileus vs obstruction, NGT placed on LIS. TPN to be initiated -Pt at moderate risk for refeeding d/t  minimal PO intake for past one week, decreased intake pta, and unintentional wt loss. K low-being repleted. Mg WNL and Phos pending -Has hx of DM2, CBGs slightly elevated at 194 -Pharmacy noted Clinimix E 5/15 to be started at 40 ml/hr, with 20% fat emulsion at 10 ml/hr.  -Plan to advance to goal rate of 80 ml/rh with 20% fat emulsion at 10 ml/hr to provide 1843 kcal (100% est kcal needs), and 96 gram protein (100% est protein needs)   Height: Ht Readings from Last 1 Encounters:  07/05/14 5' 2.5" (1.588 m)    Weight: Wt Readings from Last 1 Encounters:  07/10/14 164 lb 1.6 oz (74.435 kg)    Ideal Body Weight: 110 lbs  % Ideal Body Weight: 149%  Wt Readings from Last 10 Encounters:  07/10/14 164 lb 1.6 oz (74.435 kg)  07/01/14 163 lb (73.936 kg)  07/01/14 163 lb (73.936 kg)  06/25/14 163 lb (73.936 kg)  06/16/14 156 lb 9.6 oz (71.033 kg)    Usual Body Weight: 166 lbs per previous medical records  % Usual Body Weight: 99%  BMI:  Body mass index is 29.52 kg/(m^2). Overweight  Estimated Nutritional Needs: Kcal: 1600-1800 Protein: 85-100 gram Fluid: >/=1600 ml/daily  Skin: WDL  Diet Order: Clear Liquid  EDUCATION NEEDS: -No education needs identified at this time   Intake/Output Summary (Last 24 hours) at 07/10/14 1534 Last data filed at 07/10/14 1300  Gross per 24 hour  Intake    891 ml  Output      0 ml  Net    891 ml    Last BM: 7/29   Labs:   Recent Labs Lab 07/07/14 0025 07/08/14 0422  07/08/14 1020 07/09/14 0427 07/10/14 1030  NA 138 141  --  136*  --   K 4.1 3.8  --  3.6*  --   CL 101 106  --  102  --   CO2 22 21  --  22  --   BUN 11 10  --  3*  --   CREATININE 0.44* 0.43*  --  0.41*  --   CALCIUM 8.5 8.1*  --  8.3*  --   MG  --  1.9 1.9  --  1.7  PHOS  --  2.3  --   --   --   GLUCOSE 153* 110*  --  194*  --     CBG (last 3)   Recent Labs  07/09/14 2122 07/10/14 0725 07/10/14 1115  GLUCAP 139* 167* 111*    Scheduled Meds: .  chlorhexidine  15 mL Mouth Rinse BID  . docusate sodium  100 mg Oral Daily  . enoxaparin  40 mg Subcutaneous Q24H  . famotidine (PEPCID) IV  20 mg Intravenous Q12H  . feeding supplement (RESOURCE BREEZE)  1 Container Oral TID BM  . insulin aspart  0-15 Units Subcutaneous 6 times per day  . insulin aspart  0-9 Units Subcutaneous TID WC  . ketorolac  30 mg Intravenous 4 times per day  . losartan  100 mg Oral q morning - 10a  . potassium phosphate IVPB (mEq)  20 mEq Intravenous Once  . senna-docusate  1 tablet Oral BID  . tamsulosin  0.4 mg Oral Daily    Continuous Infusions: . dextrose 5 % and 0.45 % NaCl with KCl 40 mEq/L 125 mL/hr at 07/10/14 0528  . dextrose 5 % and 0.45 % NaCl with KCl 40 mEq/L    . Marland KitchenTPN (CLINIMIX-E) Adult     And  . fat emulsion      Past Medical History  Diagnosis Date  . Hypertension   . Left bundle branch block (LBBB) age 6  . Anxiety   . Diabetes mellitus without complication     type 2  . GERD (gastroesophageal reflux disease)   . H/O hiatal hernia     small  . Arthritis   . Idiopathic neuropathy     biochemical  . Meningioma     x2    Past Surgical History  Procedure Laterality Date  . Abdominal hysterectomy      with left salpingo-oophorectomy, performed via laparotomy for edometriosis at age 89  . Inguinal hernia repair Bilateral 2008  . Appendectomy  early 30's    ruptured for a month before surgery  . Knee surgery Bilateral 2006    x2 on right, x1 left  . Salpingoophorectomy Right 07/01/2014    Procedure: EXPLORATORY LAPAROTOMY/RIGHT SALPINGO OOPHORECTOMY, OMENTECTOMY, TUMOR DEBULKING;  Surgeon: Everitt Amber, MD;  Location: [facility];  Service: Gynecology;  Laterality: Right;  . Abdominal surgery      Atlee Abide Pennington LDN Clinical Dietitian [phone]

## 2014-07-10 NOTE — Progress Notes (Signed)
Subjective: Reports flatus and BM this morning, but also "gagging and retching". Doesn't feel like taking in PO. Had emesis x 2 bilious yesterday despite sips of clears.   Objective: Vital signs in last 24 hours: Temp:  [98.1 F (36.7 C)-98.5 F (36.9 C)] 98.4 F (36.9 C) (07/30 0522) Pulse Rate:  [83-93] 93 (07/30 0522) Resp:  [16-20] 16 (07/30 0522) BP: (143-147)/(78-81) 143/79 mmHg (07/30 0522) SpO2:  [99 %-100 %] 99 % (07/30 0522) Weight:  [164 lb 1.6 oz (74.435 kg)] 164 lb 1.6 oz (74.435 kg) (07/30 0522)  Intake/Output from previous day: 07/29 0701 - 07/30 0700 In: 320 [P.O.:320] Out: -  Intake/Output this shift:    Abdomen: abnormal findings:  hypoactive bowel sounds but present, no tenderness, and softly distended I: C/D/I Ext: NT, negative Homan's sign Results for orders placed during the hospital encounter of 07/04/14 (from the past 24 hour(s))  GLUCOSE, CAPILLARY     Status: Abnormal   Collection Time    07/09/14 12:04 PM      Result Value Ref Range   Glucose-Capillary 134 (*) 70 - 99 mg/dL  GLUCOSE, CAPILLARY     Status: Abnormal   Collection Time    07/09/14  4:52 PM      Result Value Ref Range   Glucose-Capillary 163 (*) 70 - 99 mg/dL   Comment 1 Documented in Chart     Comment 2 Notify RN    GLUCOSE, CAPILLARY     Status: Abnormal   Collection Time    07/09/14  9:22 PM      Result Value Ref Range   Glucose-Capillary 139 (*) 70 - 99 mg/dL   Comment 1 Notify RN    GLUCOSE, CAPILLARY     Status: Abnormal   Collection Time    07/10/14  7:25 AM      Result Value Ref Range   Glucose-Capillary 167 (*) 70 - 99 mg/dL   Comment 1 Documented in Chart     Comment 2 Notify RN      CBC    Component Value Date/Time   WBC 6.5 07/09/2014 0427   RBC 3.51* 07/09/2014 0427   HGB 9.8* 07/09/2014 0427   HCT 30.3* 07/09/2014 0427   PLT 242 07/09/2014 0427   MCV 86.3 07/09/2014 0427   MCH 27.9 07/09/2014 0427   MCHC 32.3 07/09/2014 0427   RDW 14.1 07/09/2014 0427   LYMPHSABS 1.3 07/04/2014 2358   MONOABS 0.9 07/04/2014 2358   EOSABS 0.1 07/04/2014 2358   BASOSABS 0.0 07/04/2014 2358   CMP     Component Value Date/Time   NA 136* 07/09/2014 0427   K 3.6* 07/09/2014 0427   CL 102 07/09/2014 0427   CO2 22 07/09/2014 0427   GLUCOSE 194* 07/09/2014 0427   BUN 3* 07/09/2014 0427   CREATININE 0.41* 07/09/2014 0427   CALCIUM 8.3* 07/09/2014 0427   PROT 5.2* 07/09/2014 0427   ALBUMIN 2.1* 07/09/2014 0427   AST 10 07/09/2014 0427   ALT 24 07/09/2014 0427   ALKPHOS 160* 07/09/2014 0427   BILITOT 0.5 07/09/2014 0427   GFRNONAA >90 07/09/2014 0427   GFRAA >90 07/09/2014 0427     Scheduled Meds: . chlorhexidine  15 mL Mouth Rinse BID  . docusate sodium  100 mg Oral Daily  . enoxaparin  40 mg Subcutaneous Q24H  . famotidine (PEPCID) IV  20 mg Intravenous Q12H  . feeding supplement (RESOURCE BREEZE)  1 Container Oral TID BM  . insulin aspart  0-9  Units Subcutaneous TID WC  . ketorolac  30 mg Intravenous 4 times per day  . losartan  100 mg Oral q morning - 10a  . potassium phosphate IVPB (mEq)  20 mEq Intravenous Once  . senna-docusate  1 tablet Oral BID  . tamsulosin  0.4 mg Oral Daily   Continuous Infusions: . dextrose 5 % and 0.45 % NaCl with KCl 40 mEq/L 125 mL/hr at 07/10/14 0528   PRN Meds:baclofen, HYDROmorphone (DILAUDID) injection, LORazepam, ondansetron (ZOFRAN) IV, ondansetron, oxyCODONE-acetaminophen, promethazine  Assessment/Plan: Principal Problem:   SBO (small bowel obstruction) - unresolved. Appears to be partial/incomplete obstruction b'se still passing flatus and BM's but has food intolerance and emesis. Will check lipase (added on to last lab draw)to rule out acute pancreatitis as an etiology. Ordered acute abdominal series to eval for signs of proximal bowel distension (which would necessitate an NGT) or gas in distal bowel and colon (more reassuring). Normal WBC, no pain, no rebound, nonsurgical abdominal exam.  Active Problems:  Mild  hypokalemia - ordered 10meq KcL. Recheck lytes in am.     Type II or unspecified type diabetes mellitus without mention of complication, not stated as uncontrolled   Hypertension   Right ovarian epithelial cancer   Normocytic anemia   Vaginal yeast infection   Hypokalemia CBGs < 200 Electrolytes stable  -->Sips of clears -->ambulate -->daily weights    LOS: 6 days   Donaciano Eva

## 2014-07-10 NOTE — Progress Notes (Signed)
Progress Note   Kristin Meadows CMK:[id number] DOB: 02/27/1956 DOA: 07/04/2014 PCP: Ernestene Kiel, MD   Brief Narrative:   Kristin Meadows is an 58 y.o. female who underwent exploratory laparotomy, right salpingo-oophorectomy, omentectomy and tumor debulking 07/01/14 who presented to the hospital on 07/04/14 with a chief complaint of nausea and vomiting. Upon initial evaluation, the patient was found to have a small bowel obstruction, and was referred for inpatient management.  Assessment/Plan:   Principal Problem:   SBO (small bowel obstruction)  Treated conservatively with bowel rest, IV fluids, analgesics, and anti-emetics.  Slow to resolve. Vomited twice yesterday and repeat acute abdominal series done today shows ongoing ileus versus obstruction.  Being followed by GYN oncology.  Active Problems:   Protein calorie malnutrition  Related to no appreciable nutritional intake for past 6 days.  Will speak with OB GYN about possibly starting TNA.    Hypokalemia  Increased potassium in IV fluids.  Labs not re-checked today.  BMET in a.m.    Vaginal yeast infection  Status post Diflucan x1 07/08/14.    Type II or unspecified type diabetes mellitus without mention of complication, not stated as uncontrolled  Metformin currently on hold.  Being managed by insulin sensitive SSI. CBG range 134-176.    Hypertension  Continue Cozaar. Lasix on hold.    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     Normocytic anemia  Mild. Likely anemia of chronic disease.    DVT Prophylaxis  Continue Lovenox.  Code Status: Full. Family Communication: Husband/family updated at bedside. Disposition Plan: Home when stable.   IV Access:    Peripheral IV   Procedures and diagnostic studies:    CT scan of the abdomen and pelvis 07/05/14: Prominent  fluid-filled dilated proximal small bowel. Distal small bowel decompressed. Intraperitoneal free fluid, more so on the left. Interval resection of the complex cyst in the pelvis.  Acute abdominal series 07/10/14: Persistent dilatation of proximal small bowel loops with paucity of colonic gas, question ileus versus SBO. Bibasilar effusions larger on left with left basilar atelectasis. Questionable medial right upper lobe infiltrate versus artifact, mass considered less likely as this was not definitely seen on 06/25/14, radiographic followup until resolution recommended to exclude underlying abnormalities.   Medical Consultants:    Dr. Everitt Amber, GYN-ONC   Other Consultants:    None.   Anti-Infectives:    Diflucan x17/28/15.  Subjective:   Kristin Meadows continues to have occasional bouts of nausea/vomiting, 2 episodes yesterday. She continues to report bowel movements and passing flatus. No significant pain.  Objective:    Filed Vitals:   07/09/14 0612 07/09/14 1430 07/09/14 2126 07/10/14 0522  BP: 149/80 145/81 147/78 143/79  Pulse: 88 83 89 93  Temp: 98.1 F (36.7 C) 98.1 F (36.7 C) 98.5 F (36.9 C) 98.4 F (36.9 C)  TempSrc: Oral Oral Oral Oral  Resp: 20 20 18 16   Height:      Weight:    74.435 kg (164 lb 1.6 oz)  SpO2: 99% 100% 100% 99%    Intake/Output Summary (Last 24 hours) at 07/10/14 0746 Last data filed at 07/10/14 0522  Gross per 24 hour  Intake    320 ml  Output      0 ml  Net    320 ml    Exam: Gen:  NAD Cardiovascular:  RRR, 2/6 systolic ejection murmur left upper sternal  border Respiratory:  Lungs CTAB Gastrointestinal:  Abdomen soft, NT/ND, + BS Extremities:  No C/E/C   Data Reviewed:    Labs: Basic Metabolic Panel:  Recent Labs Lab 07/04/14 2358 07/05/14 0700 07/07/14 0025 07/08/14 0422 07/08/14 1020 07/09/14 0427  NA 139 138 138 141  --  136*  K 4.8 4.2 4.1 3.8  --  3.6*  CL 100 102 101 106  --  102  CO2 24 25 22 21   --   22  GLUCOSE 192* 169* 153* 110*  --  194*  BUN 8 8 11 10   --  3*  CREATININE 0.57 0.53 0.44* 0.43*  --  0.41*  CALCIUM 9.2 8.4 8.5 8.1*  --  8.3*  MG  --   --   --  1.9 1.9  --   PHOS  --   --   --  2.3  --   --    GFR Estimated Creatinine Clearance: 74.1 ml/min (by C-G formula based on Cr of 0.41). Liver Function Tests:  Recent Labs Lab 07/04/14 2358 07/05/14 0700 07/09/14 0427  AST 25 26 10   ALT 37* 33 24  ALKPHOS 124* 115 160*  BILITOT 0.9 0.6 0.5  PROT 6.6 5.7* 5.2*  ALBUMIN 2.9* 2.4* 2.1*    Recent Labs Lab 07/04/14 2358  LIPASE 7*   CBC:  Recent Labs Lab 07/04/14 2358 07/05/14 0700 07/07/14 0025 07/09/14 0427  WBC 9.7 7.6 7.5 6.5  NEUTROABS 7.4  --   --   --   HGB 11.5* 10.5* 10.0* 9.8*  HCT 35.2* 32.4* 31.0* 30.3*  MCV 87.6 86.9 88.1 86.3  PLT 278 255 262 242   CBG:  Recent Labs Lab 07/09/14 0837 07/09/14 1204 07/09/14 1652 07/09/14 2122 07/10/14 0725  GLUCAP 176* 134* 163* 139* 167*   Sepsis Labs:  Recent Labs Lab 07/04/14 2358 07/05/14 0053 07/05/14 0700 07/07/14 0025 07/09/14 0427  WBC 9.7  --  7.6 7.5 6.5  LATICACIDVEN  --  1.88  --   --   --    Microbiology No results found for this or any previous visit (from the past 240 hour(s)).   Medications:   . chlorhexidine  15 mL Mouth Rinse BID  . docusate sodium  100 mg Oral Daily  . enoxaparin  40 mg Subcutaneous Q24H  . famotidine (PEPCID) IV  20 mg Intravenous Q12H  . feeding supplement (RESOURCE BREEZE)  1 Container Oral TID BM  . insulin aspart  0-9 Units Subcutaneous TID WC  . ketorolac  30 mg Intravenous 4 times per day  . losartan  100 mg Oral q morning - 10a  . senna-docusate  1 tablet Oral BID  . tamsulosin  0.4 mg Oral Daily   Continuous Infusions: . dextrose 5 % and 0.45 % NaCl with KCl 40 mEq/L 125 mL/hr at 07/10/14 0528    Time spent: 25 minutes.   LOS: 6 days   [facility] Pager [phone]. If unable to reach me by pager, please  call my cell phone at [phone].  *Please refer to [email or web address], password [email or web address] to get updated schedule on who will round on this patient, as hospitalists switch teams weekly. If 7PM-7AM, please contact night-coverage at [email or web address], password [email or web address] for any overnight needs.  07/10/2014, 7:46 AM    **Disclaimer: This note was dictated with voice recognition software. Similar sounding words can inadvertently be transcribed and this note may contain transcription errors which may not have been corrected upon publication of note.**

## 2014-07-11 ENCOUNTER — Inpatient Hospital Stay (HOSPITAL_COMMUNITY): Payer: Managed Care, Other (non HMO)

## 2014-07-11 ENCOUNTER — Encounter (HOSPITAL_COMMUNITY): Payer: Self-pay | Admitting: Radiology

## 2014-07-11 DIAGNOSIS — E440 Moderate protein-calorie malnutrition: Secondary | ICD-10-CM

## 2014-07-11 DIAGNOSIS — E43 Unspecified severe protein-calorie malnutrition: Secondary | ICD-10-CM

## 2014-07-11 DIAGNOSIS — K859 Acute pancreatitis without necrosis or infection, unspecified: Secondary | ICD-10-CM

## 2014-07-11 LAB — CBC
HCT: 30.8 % — ABNORMAL LOW (ref 36.0–46.0)
HEMOGLOBIN: 10.4 g/dL — AB (ref 12.0–15.0)
MCH: 28.7 pg (ref 26.0–34.0)
MCHC: 33.8 g/dL (ref 30.0–36.0)
MCV: 84.8 fL (ref 78.0–100.0)
Platelets: 299 10*3/uL (ref 150–400)
RBC: 3.63 MIL/uL — ABNORMAL LOW (ref 3.87–5.11)
RDW: 14.2 % (ref 11.5–15.5)
WBC: 8.7 10*3/uL (ref 4.0–10.5)

## 2014-07-11 LAB — COMPREHENSIVE METABOLIC PANEL
ALK PHOS: 129 U/L — AB (ref 39–117)
ALT: 15 U/L (ref 0–35)
AST: 9 U/L (ref 0–37)
Albumin: 2.6 g/dL — ABNORMAL LOW (ref 3.5–5.2)
Anion gap: 11 (ref 5–15)
BUN: 4 mg/dL — ABNORMAL LOW (ref 6–23)
CO2: 28 meq/L (ref 19–32)
Calcium: 9 mg/dL (ref 8.4–10.5)
Chloride: 96 mEq/L (ref 96–112)
Creatinine, Ser: 0.44 mg/dL — ABNORMAL LOW (ref 0.50–1.10)
GFR calc non Af Amer: >90 mL/min (ref >90)
Glucose, Bld: 187 mg/dL — ABNORMAL HIGH (ref 70–99)
POTASSIUM: 3.6 meq/L — AB (ref 3.7–5.3)
SODIUM: 135 meq/L — AB (ref 137–147)
Total Bilirubin: 0.4 mg/dL (ref 0.3–1.2)
Total Protein: 5.7 g/dL — ABNORMAL LOW (ref 6.0–8.3)

## 2014-07-11 LAB — GLUCOSE, CAPILLARY
GLUCOSE-CAPILLARY: 167 mg/dL — AB (ref 70–99)
GLUCOSE-CAPILLARY: 199 mg/dL — AB (ref 70–99)
GLUCOSE-CAPILLARY: 203 mg/dL — AB (ref 70–99)
GLUCOSE-CAPILLARY: 109 mg/dL — AB (ref 70–99)

## 2014-07-11 LAB — DIFFERENTIAL
BASOS ABS: 0 10*3/uL (ref 0.0–0.1)
Basophils Relative: 1 % (ref 0–1)
Eosinophils Absolute: 0.3 10*3/uL (ref 0.0–0.7)
Eosinophils Relative: 3 % (ref 0–5)
Lymphocytes Relative: 17 % (ref 12–46)
Lymphs Abs: 1.4 10*3/uL (ref 0.7–4.0)
MONO ABS: 0.9 10*3/uL (ref 0.1–1.0)
Monocytes Relative: 10 % (ref 3–12)
NEUTROS ABS: 6.1 10*3/uL (ref 1.7–7.7)
Neutrophils Relative %: 69 % (ref 43–77)

## 2014-07-11 LAB — MAGNESIUM: Magnesium: 1.7 mg/dL (ref 1.5–2.5)

## 2014-07-11 LAB — PHOSPHORUS: PHOSPHORUS: 3.8 mg/dL (ref 2.3–4.6)

## 2014-07-11 LAB — TRIGLYCERIDES: Triglycerides: 164 mg/dL — ABNORMAL HIGH (ref <150)

## 2014-07-11 LAB — PREALBUMIN: Prealbumin: 12.3 mg/dL — ABNORMAL LOW (ref 17.0–34.0)

## 2014-07-11 LAB — LIPASE, BLOOD: LIPASE: 56 U/L (ref 11–59)

## 2014-07-11 MED ORDER — POTASSIUM CHLORIDE 10 MEQ/50ML IV SOLN
10.0000 meq | INTRAVENOUS | Status: AC
  Administered 2014-07-11 (×3): 10 meq via INTRAVENOUS
  Filled 2014-07-11 (×3): qty 50

## 2014-07-11 MED ORDER — ONDANSETRON HCL 4 MG PO TABS: 4.0000 mg | ORAL_TABLET | ORAL | Status: DC | PRN

## 2014-07-11 MED ORDER — TRACE MINERALS CR-CU-F-FE-I-MN-MO-SE-ZN IV SOLN
INTRAVENOUS | Status: AC
  Administered 2014-07-11: 18:00:00 via INTRAVENOUS
  Filled 2014-07-11: qty 1000

## 2014-07-11 MED ORDER — ONDANSETRON HCL 4 MG/2ML IJ SOLN
4.0000 mg | INTRAMUSCULAR | Status: DC | PRN
  Administered 2014-07-11 – 2014-07-14 (×5): 4 mg via INTRAVENOUS
  Filled 2014-07-11 (×4): qty 2

## 2014-07-11 MED ORDER — IOHEXOL 300 MG/ML  SOLN
100.0000 mL | Freq: Once | INTRAMUSCULAR | Status: AC | PRN
  Administered 2014-07-11: 100 mL via INTRAVENOUS

## 2014-07-11 MED ORDER — FAT EMULSION 20 % IV EMUL
250.0000 mL | INTRAVENOUS | Status: AC
  Administered 2014-07-11: 250 mL via INTRAVENOUS
  Filled 2014-07-11: qty 250

## 2014-07-11 NOTE — Progress Notes (Signed)
Subjective: Reports continued flatus. No BM this morning. Less abdominal pain than yesterday. Generally feels "well". Some nausea and gagging around NGT. No emesis around NGT.  Objective: Vital signs in last 24 hours: Temp:  [98 F (36.7 C)-98.2 F (36.8 C)] 98.2 F (36.8 C) (07/31 0359) Pulse Rate:  [76-92] 92 (07/31 0359) Resp:  [16-18] 16 (07/31 0359) BP: (133-143)/(65-76) 136/65 mmHg (07/31 0359) SpO2:  [97 %-98 %] 98 % (07/31 0359) Weight:  [162 lb 6.4 oz (73.664 kg)] 162 lb 6.4 oz (73.664 kg) (07/31 0359)  Intake/Output from previous day: 07/30 0701 - 07/31 0700 In: 721 [P.O.:50; I.V.:671] Out: 750 [Urine:300] NGT put out 430cc in past 24 hours. Intake/Output this shift:    Abdomen: abnormal findings:  normal bowel sounds present, no tenderness, and softly distended. Incision c/d/i I: C/D/I Ext: NT, negative Homan's sign Results for orders placed during the hospital encounter of 07/04/14 (from the past 24 hour(s))  LIPASE, BLOOD     Status: Abnormal   Collection Time    07/10/14 10:30 AM      Result Value Ref Range   Lipase 60 (*) 11 - 59 U/L  MAGNESIUM     Status: None   Collection Time    07/10/14 10:30 AM      Result Value Ref Range   Magnesium 1.7  1.5 - 2.5 mg/dL  GLUCOSE, CAPILLARY     Status: Abnormal   Collection Time    07/10/14 11:15 AM      Result Value Ref Range   Glucose-Capillary 111 (*) 70 - 99 mg/dL   Comment 1 Documented in Chart     Comment 2 Notify RN    GLUCOSE, CAPILLARY     Status: Abnormal   Collection Time    07/10/14  4:02 PM      Result Value Ref Range   Glucose-Capillary 139 (*) 70 - 99 mg/dL   Comment 1 Notify RN    GLUCOSE, CAPILLARY     Status: Abnormal   Collection Time    07/10/14  7:51 PM      Result Value Ref Range   Glucose-Capillary 112 (*) 70 - 99 mg/dL   Comment 1 Notify RN    GLUCOSE, CAPILLARY     Status: Abnormal   Collection Time    07/10/14 11:51 PM      Result Value Ref Range   Glucose-Capillary 172 (*) 70 -  99 mg/dL   Comment 1 Notify RN    GLUCOSE, CAPILLARY     Status: Abnormal   Collection Time    07/11/14  3:47 AM      Result Value Ref Range   Glucose-Capillary 167 (*) 70 - 99 mg/dL  COMPREHENSIVE METABOLIC PANEL     Status: Abnormal   Collection Time    07/11/14  5:55 AM      Result Value Ref Range   Sodium 135 (*) 137 - 147 mEq/L   Potassium 3.6 (*) 3.7 - 5.3 mEq/L   Chloride 96  96 - 112 mEq/L   CO2 28  19 - 32 mEq/L   Glucose, Bld 187 (*) 70 - 99 mg/dL   BUN 4 (*) 6 - 23 mg/dL   Creatinine, Ser 0.44 (*) 0.50 - 1.10 mg/dL   Calcium 9.0  8.4 - 10.5 mg/dL   Total Protein 5.7 (*) 6.0 - 8.3 g/dL   Albumin 2.6 (*) 3.5 - 5.2 g/dL   AST 9  0 - 37 U/L   ALT 15  0 - 35 U/L   Alkaline Phosphatase 129 (*) 39 - 117 U/L   Total Bilirubin 0.4  0.3 - 1.2 mg/dL   GFR calc non Af Amer >90  >90 mL/min   GFR calc Af Amer >90  >90 mL/min   Anion gap 11  5 - 15  MAGNESIUM     Status: None   Collection Time    07/11/14  5:55 AM      Result Value Ref Range   Magnesium 1.7  1.5 - 2.5 mg/dL  PHOSPHORUS     Status: None   Collection Time    07/11/14  5:55 AM      Result Value Ref Range   Phosphorus 3.8  2.3 - 4.6 mg/dL  TRIGLYCERIDES     Status: Abnormal   Collection Time    07/11/14  5:55 AM      Result Value Ref Range   Triglycerides 164 (*) <150 mg/dL  CBC     Status: Abnormal   Collection Time    07/11/14  5:55 AM      Result Value Ref Range   WBC 8.7  4.0 - 10.5 K/uL   RBC 3.63 (*) 3.87 - 5.11 MIL/uL   Hemoglobin 10.4 (*) 12.0 - 15.0 g/dL   HCT 30.8 (*) 36.0 - 46.0 %   MCV 84.8  78.0 - 100.0 fL   MCH 28.7  26.0 - 34.0 pg   MCHC 33.8  30.0 - 36.0 g/dL   RDW 14.2  11.5 - 15.5 %   Platelets 299  150 - 400 K/uL  LIPASE, BLOOD     Status: None   Collection Time    07/11/14  5:55 AM      Result Value Ref Range   Lipase 56  11 - 59 U/L  DIFFERENTIAL     Status: None   Collection Time    07/11/14  5:55 AM      Result Value Ref Range   Neutrophils Relative % 69  43 - 77 %    Neutro Abs 6.1  1.7 - 7.7 K/uL   Lymphocytes Relative 17  12 - 46 %   Lymphs Abs 1.4  0.7 - 4.0 K/uL   Monocytes Relative 10  3 - 12 %   Monocytes Absolute 0.9  0.1 - 1.0 K/uL   Eosinophils Relative 3  0 - 5 %   Eosinophils Absolute 0.3  0.0 - 0.7 K/uL   Basophils Relative 1  0 - 1 %   Basophils Absolute 0.0  0.0 - 0.1 K/uL  GLUCOSE, CAPILLARY     Status: Abnormal   Collection Time    07/11/14  8:14 AM      Result Value Ref Range   Glucose-Capillary 199 (*) 70 - 99 mg/dL   Comment 1 Documented in Chart     Comment 2 Notify RN      CBC    Component Value Date/Time   WBC 8.7 07/11/2014 0555   RBC 3.63* 07/11/2014 0555   HGB 10.4* 07/11/2014 0555   HCT 30.8* 07/11/2014 0555   PLT 299 07/11/2014 0555   MCV 84.8 07/11/2014 0555   MCH 28.7 07/11/2014 0555   MCHC 33.8 07/11/2014 0555   RDW 14.2 07/11/2014 0555   LYMPHSABS 1.4 07/11/2014 0555   MONOABS 0.9 07/11/2014 0555   EOSABS 0.3 07/11/2014 0555   BASOSABS 0.0 07/11/2014 0555   CMP     Component Value Date/Time   NA 135* 07/11/2014 0555  K 3.6* 07/11/2014 0555   CL 96 07/11/2014 0555   CO2 28 07/11/2014 0555   GLUCOSE 187* 07/11/2014 0555   BUN 4* 07/11/2014 0555   CREATININE 0.44* 07/11/2014 0555   CALCIUM 9.0 07/11/2014 0555   PROT 5.7* 07/11/2014 0555   ALBUMIN 2.6* 07/11/2014 0555   AST 9 07/11/2014 0555   ALT 15 07/11/2014 0555   ALKPHOS 129* 07/11/2014 0555   BILITOT 0.4 07/11/2014 0555   GFRNONAA >90 07/11/2014 0555   GFRAA >90 07/11/2014 0555     Scheduled Meds: . chlorhexidine  15 mL Mouth Rinse BID  . docusate sodium  100 mg Oral Daily  . enoxaparin  40 mg Subcutaneous Q24H  . famotidine (PEPCID) IV  20 mg Intravenous Q12H  . feeding supplement (RESOURCE BREEZE)  1 Container Oral TID BM  . insulin aspart  0-15 Units Subcutaneous 6 times per day  . ketorolac  30 mg Intravenous 4 times per day  . losartan  100 mg Oral q morning - 10a  . potassium chloride  10 mEq Intravenous Q1 Hr x 3  . senna-docusate  1 tablet Oral BID   . tamsulosin  0.4 mg Oral Daily   Continuous Infusions: . dextrose 5 % and 0.45 % NaCl with KCl 40 mEq/L    . Marland KitchenTPN (CLINIMIX-E) Adult 40 mL/hr at 07/10/14 1952   And  . fat emulsion 250 mL (07/10/14 1952)  . Marland KitchenTPN (CLINIMIX-E) Adult     And  . fat emulsion     PRN Meds:acetaminophen, baclofen, HYDROmorphone (DILAUDID) injection, LORazepam, ondansetron (ZOFRAN) IV, ondansetron, oxyCODONE-acetaminophen, phenol, promethazine, sodium chloride  Assessment/Plan: Principal Problem:   SBO (small bowel obstruction), partial - unresolved. Appears to be partial/incomplete obstruction b'se still passing flatus and BM's but has food intolerance and emesis. A component of this might be pancreatitis (consistent with imaging findings). Mildly elelvated Lipase yesterday -improved today with NPO status. Will continue to follow lipase daily. Ordered CT abdo/pelvis enterogram with contrast as this may be somewhat therapeutic to assist in resolution of partial SBO. Would continue NPO status until patient has NGT outputs <300cc over a 24 hour period with continued passage of flatus +/- BM, and has normalization of lipase. At that time  I would recommend clamping tube first - sips around NGT, then removal if she tolerates this.   Active Problems: Severe malnutrition - Albumin 2.1. NPO for 5 days during this admission, with limited PO after surgery (on 7/21). TPN started 07/10/14 with PICC line. Would continue while NPO with NGT and until taking in adequate PO.     Type II or unspecified type diabetes mellitus without mention of complication, not stated as uncontrolled   Hypertension   Right ovarian epithelial cancer   Normocytic anemia   Vaginal yeast infection   Hypokalemia CBGs < 200 Electrolytes stable  -->ambulate -->daily weights    LOS: 7 days   Donaciano Eva

## 2014-07-11 NOTE — Progress Notes (Signed)
Family at bedside. Pt ambulating around the unit with husband

## 2014-07-11 NOTE — Progress Notes (Signed)
PARENTERAL NUTRITION CONSULT NOTE - Follow up  Pharmacy Consult for TPN Indication: prolonged ileus  Allergies  Allergen Reactions  . Other     Silk tape-RASH  . Penicillins     "Paralyzed me"  . Sulfa Antibiotics     Rash    Patient Measurements: Height: 5' 2.5" (158.8 cm) Weight: 162 lb 6.4 oz (73.664 kg) IBW/kg (Calculated) : 51.25 BMI: 29.5 Adjusted Body Weight: 60.5 kg Usual Weight:   Vital Signs: Temp: 98.2 F (36.8 C) (07/31 0359) Temp src: Oral (07/31 0359) BP: 136/65 mmHg (07/31 0359) Pulse Rate: 92 (07/31 0359) Intake/Output from previous day: 07/30 0701 - 07/31 0700 In: 721 [P.O.:50; I.V.:671] Out: 750 [Urine:300; Emesis/NG output:450] Intake/Output from this shift:    Labs:  Recent Labs  07/09/14 0427 07/11/14 0555  WBC 6.5 8.7  HGB 9.8* 10.4*  HCT 30.3* 30.8*  PLT 242 299     Recent Labs  07/08/14 1020 07/09/14 0427 07/10/14 1030 07/11/14 0555  NA  --  136*  --  135*  K  --  3.6*  --  3.6*  CL  --  102  --  96  CO2  --  22  --  28  GLUCOSE  --  194*  --  187*  BUN  --  3*  --  4*  CREATININE  --  0.41*  --  0.44*  CALCIUM  --  8.3*  --  9.0  MG 1.9  --  1.7 1.7  PHOS  --   --   --  3.8  PROT  --  5.2*  --  5.7*  ALBUMIN  --  2.1*  --  2.6*  AST  --  10  --  9  ALT  --  24  --  15  ALKPHOS  --  160*  --  129*  BILITOT  --  0.5  --  0.4  TRIG  --   --   --  164*   Estimated Creatinine Clearance: 73.9 ml/min (by C-G formula based on Cr of 0.44).    Recent Labs  07/10/14 1951 07/10/14 2351 07/11/14 0347  GLUCAP 112* 172* 167*    Medical History: Past Medical History  Diagnosis Date  . Hypertension   . Left bundle branch block (LBBB) age 8  . Anxiety   . Diabetes mellitus without complication     type 2  . GERD (gastroesophageal reflux disease)   . H/O hiatal hernia     small  . Arthritis   . Idiopathic neuropathy     biochemical  . Meningioma     x2    Medications:  Scheduled:  . chlorhexidine  15 mL  Mouth Rinse BID  . docusate sodium  100 mg Oral Daily  . enoxaparin  40 mg Subcutaneous Q24H  . famotidine (PEPCID) IV  20 mg Intravenous Q12H  . feeding supplement (RESOURCE BREEZE)  1 Container Oral TID BM  . insulin aspart  0-15 Units Subcutaneous 6 times per day  . ketorolac  30 mg Intravenous 4 times per day  . losartan  100 mg Oral q morning - 10a  . potassium chloride  10 mEq Intravenous Q1 Hr x 3  . senna-docusate  1 tablet Oral BID  . tamsulosin  0.4 mg Oral Daily   Infusions:  . dextrose 5 % and 0.45 % NaCl with KCl 40 mEq/L    . Marland KitchenTPN (CLINIMIX-E) Adult 40 mL/hr at 07/10/14 1952   And  .  fat emulsion 250 mL (07/10/14 1952)    Insulin Requirements in the past 24 hours: 7 units during the first 12 hours of TPN. Mod SSI q4h.  Current Nutrition: CL - poor intake  IVF: D51/2NS 40KCL at 66m/hr  Assessment: 89yoF w/ ovarian cancer underwent exploratory laparotomy, right salpingo-oophorectomy, omentectomy and tumor debulking 7/21. Admitted 7/24 with nausea and vomiting, suspected SBO. 7/30: Due to persistant N/V and abd xray showing ileus vs. obstruction, pharmacy was asked to start TPN. Noted hx of DM2. Prior to initial surgery she reported poor appetite and a 10lb weight loss over 2 weeks. At moderate risk for refeeding syndrome.   Glucose - 112-187  Electrolytes - K slightly low but stable. Mag and Phos wnl. 10.1.   LFTs - AST/ALT wnl. Alk phos slightly elevated.  TGs - 164  Prealbumin - pending  Nutritional Goals:  RD recs: 85-100 g/day protein, 1600-1800Kcal/day. Clinimix E 5/15 at a goal rate of 770mhr + 20% fat emulsion at 1088mr to provide: 90g/day protein, 1758Kcal/day.  TPN Access: PICC TPN day#: 2  Plan:   Cont  Clinimix E 5/15 at 33m60m.  20% fat emulsion at 10ml43m  Plan to advance as tolerated to the goal rate. Concern for refeeding syndrome and hyperglycemia.  Add 10units/L insulin to TPN.  TNA to contain standard multivitamins and trace  elements.  MD ordered 30mEq35m IV today.  Cont IVF to 75ml/h62mCont SSI to moderate q4h.   Cmet, Mag, Phos.  TNA lab panels on Mondays & Thursdays.  F/u daily.  Tom PicRomeo RabonD, pager [phone],8:24 AM.

## 2014-07-11 NOTE — Progress Notes (Addendum)
Progress Note   Kristin Meadows [id number] DOB: 12-Dec-1956 DOA: 07/04/2014 PCP: Ernestene Kiel, MD   Brief Narrative:   Kristin Meadows is an 58 y.o. female who underwent exploratory laparotomy, right salpingo-oophorectomy, omentectomy and tumor debulking 07/01/14 who presented to the hospital on 07/04/14 with a chief complaint of nausea and vomiting. Upon initial evaluation, the patient was found to have a small bowel obstruction, and was referred for inpatient management.  Assessment/Plan:   Principal Problem:   SBO (small bowel obstruction)  Treated conservatively with bowel rest, IV fluids, analgesics, and anti-emetics, but slow to resolve.  Repeat acute abdominal series done 07/10/14 showed ongoing ileus versus obstruction. NG tube subsequently placed.  Being followed by GYN oncology.  CT abdomen and pelvis negative for high-grade bowel obstruction/transition point.  Continue NG tube gastric decompression and conservative measures.  Active Problems:   Protein calorie malnutrition  PICC line placed for TNA.    Hypokalemia  Increased potassium in IV fluids.  We'll give 3, 10 mEq runs of potassium today.    Vaginal yeast infection  Status post Diflucan x1 07/08/14.    Type II or unspecified type diabetes mellitus without mention of complication, not stated as uncontrolled  Metformin currently on hold.  Being managed by insulin sensitive SSI. CBG range 112-172.    Hypertension  Cozaar and Lasix now on hold, NG tube in place.    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     Normocytic anemia  Mild. Likely anemia of chronic disease.    DVT Prophylaxis  Continue Lovenox.  Code Status: Full. Family Communication: Husband/family updated at bedside. Disposition Plan: Home when stable.   IV Access:    Peripheral  IV   Procedures and diagnostic studies:    CT scan of the abdomen and pelvis 07/05/14: Prominent fluid-filled dilated proximal small bowel. Distal small bowel decompressed. Intraperitoneal free fluid, more so on the left. Interval resection of the complex cyst in the pelvis.  Acute abdominal series 07/10/14: Persistent dilatation of proximal small bowel loops with paucity of colonic gas, question ileus versus SBO. Bibasilar effusions larger on left with left basilar atelectasis. Questionable medial right upper lobe infiltrate versus artifact, mass considered less likely as this was not definitely seen on 06/25/14, radiographic followup until resolution recommended to exclude underlying abnormalities.  One view of the abdomen 07/10/14: NG tube tip overlying the level of the mid stomach. SBO versus focal ileus.  CT abdomen and pelvis (enterography) 07/11/14: Nonspecific mid small bowel distention without focal transition point to suggest high-grade bowel obstruction. Similar volume of ascites with resolution of postsurgical pneumoperitoneum. Suggested nodularity anteriorly status post omentectomy was not clearly seen on the most recent study and may reflect a thrombosed varicosity. 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 This could be the sequela of pancreatitis as suggested on MRI. A mass involving the retropancreatic retroperitoneum cannot be excluded. Enlarging bilateral pleural effusions with increased bibasilar atelectasis. New mild bilateral hydronephrosis possibly related to bladder distention. No high grade ureteral obstruction. Some bladder wall thickening along the right aspect of the bladder dome.   Medical Consultants:    Dr. Everitt Amber, GYN-ONC   Other Consultants:    None.   Anti-Infectives:    Diflucan x17/28/15.  Subjective:   Kristin Meadows has had nausea. Very little NG output noted. No significant pain. Continues to report flatus and  bowels  moved yesterday.  Objective:    Filed Vitals:   07/10/14 0522 07/10/14 1400 07/10/14 2130 07/11/14 0359  BP: 143/79 143/76 133/65 136/65  Pulse: 93 76 82 92  Temp: 98.4 F (36.9 C) 98.2 F (36.8 C) 98 F (36.7 C) 98.2 F (36.8 C)  TempSrc: Oral Oral Oral Oral  Resp: 16 17 18 16   Height:      Weight: 74.435 kg (164 lb 1.6 oz)   73.664 kg (162 lb 6.4 oz)  SpO2: 99% 97% 98% 98%    Intake/Output Summary (Last 24 hours) at 07/11/14 0731 Last data filed at 07/11/14 0400  Gross per 24 hour  Intake    721 ml  Output    750 ml  Net    -29 ml    Exam: Gen:  NAD Cardiovascular:  RRR, 2/6 systolic ejection murmur left upper sternal border Respiratory:  Lungs CTAB Gastrointestinal:  Abdomen soft, NT/ND, + BS Extremities:  No C/E/C   Data Reviewed:    Labs: Basic Metabolic Panel:  Recent Labs Lab 07/05/14 0700 07/07/14 0025 07/08/14 0422 07/08/14 1020 07/09/14 0427 07/10/14 1030 07/11/14 0555  NA 138 138 141  --  136*  --  135*  K 4.2 4.1 3.8  --  3.6*  --  3.6*  CL 102 101 106  --  102  --  96  CO2 25 22 21   --  22  --  28  GLUCOSE 169* 153* 110*  --  194*  --  187*  BUN 8 11 10   --  3*  --  4*  CREATININE 0.53 0.44* 0.43*  --  0.41*  --  0.44*  CALCIUM 8.4 8.5 8.1*  --  8.3*  --  9.0  MG  --   --  1.9 1.9  --  1.7 1.7  PHOS  --   --  2.3  --   --   --  3.8   GFR Estimated Creatinine Clearance: 73.9 ml/min (by C-G formula based on Cr of 0.44). Liver Function Tests:  Recent Labs Lab 07/04/14 2358 07/05/14 0700 07/09/14 0427 07/11/14 0555  AST 25 26 10 9   ALT 37* 33 24 15  ALKPHOS 124* 115 160* 129*  BILITOT 0.9 0.6 0.5 0.4  PROT 6.6 5.7* 5.2* 5.7*  ALBUMIN 2.9* 2.4* 2.1* 2.6*    Recent Labs Lab 07/04/14 2358 07/10/14 1030 07/11/14 0555  LIPASE 7* 60* 56   CBC:  Recent Labs Lab 07/04/14 2358 07/05/14 0700 07/07/14 0025 07/09/14 0427 07/11/14 0555  WBC 9.7 7.6 7.5 6.5 8.7  NEUTROABS 7.4  --   --   --  6.1  HGB 11.5* 10.5* 10.0*  9.8* 10.4*  HCT 35.2* 32.4* 31.0* 30.3* 30.8*  MCV 87.6 86.9 88.1 86.3 84.8  PLT 278 255 262 242 299   CBG:  Recent Labs Lab 07/10/14 1115 07/10/14 1602 07/10/14 1951 07/10/14 2351 07/11/14 0347  GLUCAP 111* 139* 112* 172* 167*   Sepsis Labs:  Recent Labs Lab 07/04/14 2358 07/05/14 0053 07/05/14 0700 07/07/14 0025 07/09/14 0427 07/11/14 0555  WBC 9.7  --  7.6 7.5 6.5 8.7  LATICACIDVEN  --  1.88  --   --   --   --    Microbiology No results found for this or any previous visit (from the past 240 hour(s)).   Medications:   . chlorhexidine  15 mL Mouth Rinse BID  . docusate sodium  100 mg Oral Daily  . enoxaparin  40 mg Subcutaneous  Q24H  . famotidine (PEPCID) IV  20 mg Intravenous Q12H  . feeding supplement (RESOURCE BREEZE)  1 Container Oral TID BM  . insulin aspart  0-15 Units Subcutaneous 6 times per day  . ketorolac  30 mg Intravenous 4 times per day  . losartan  100 mg Oral q morning - 10a  . senna-docusate  1 tablet Oral BID  . tamsulosin  0.4 mg Oral Daily   Continuous Infusions: . dextrose 5 % and 0.45 % NaCl with KCl 40 mEq/L    . Marland KitchenTPN (CLINIMIX-E) Adult 40 mL/hr at 07/10/14 1952   And  . fat emulsion 250 mL (07/10/14 1952)    Time spent: 25 minutes.   LOS: 7 days   [facility] Pager [phone]. If unable to reach me by pager, please call my cell phone at [phone].  *Please refer to [email or web address], password [email or web address] to get updated schedule on who will round on this patient, as hospitalists switch teams weekly. If 7PM-7AM, please contact night-coverage at [email or web address], password [email or web address] for any overnight needs.  07/11/2014, 7:31 AM    **Disclaimer: This note was dictated with voice recognition software. Similar sounding words can inadvertently be transcribed and this note may contain transcription errors which may not have been corrected upon publication of note.**

## 2014-07-11 NOTE — Care Management Note (Signed)
    Page 1 of 1   07/11/2014     12:28:45 PM CARE MANAGEMENT NOTE 07/11/2014  Patient:  Kristin Meadows, Kristin Meadows   Account Number:  [id number]  Date Initiated:  07/11/2014  Documentation initiated by:  Dessa Phi  Subjective/Objective Assessment:   58 Y/O F ADMITTED W/SBO.     Action/Plan:   FROM HOME.   Anticipated DC Date:  07/14/2014   Anticipated DC Plan:  [place]  CM consult      Choice offered to / List presented to:             Status of service:  In process, will continue to follow Medicare Important Message given?   (If response is "NO", the following Medicare IM given date fields will be blank) Date Medicare IM given:   Medicare IM given by:   Date Additional Medicare IM given:   Additional Medicare IM given by:    Discharge Disposition:    Per UR Regulation:  Reviewed for med. necessity/level of care/duration of stay  If discussed at Long Length of Stay Meetings, dates discussed:    Comments:  07/11/14 Maedell Hedger RN,BSN NCM [phone] TNA,NGT, NAUSEA,GAGGING, NO BM, FLATUS.MONITOR PROGRESS, & D/C NEEDS.

## 2014-07-12 LAB — COMPREHENSIVE METABOLIC PANEL
ALT: 14 U/L (ref 0–35)
ANION GAP: 12 (ref 5–15)
AST: 12 U/L (ref 0–37)
Albumin: 2.8 g/dL — ABNORMAL LOW (ref 3.5–5.2)
Alkaline Phosphatase: 132 U/L — ABNORMAL HIGH (ref 39–117)
BUN: 9 mg/dL (ref 6–23)
CO2: 28 mEq/L (ref 19–32)
CREATININE: 0.5 mg/dL (ref 0.50–1.10)
Calcium: 9.5 mg/dL (ref 8.4–10.5)
Chloride: 97 mEq/L (ref 96–112)
GFR calc non Af Amer: >90 mL/min (ref >90)
Glucose, Bld: 148 mg/dL — ABNORMAL HIGH (ref 70–99)
Potassium: 4.5 mEq/L (ref 3.7–5.3)
Sodium: 137 mEq/L (ref 137–147)
TOTAL PROTEIN: 6.5 g/dL (ref 6.0–8.3)
Total Bilirubin: 0.4 mg/dL (ref 0.3–1.2)

## 2014-07-12 LAB — CBC
HCT: 34.8 % — ABNORMAL LOW (ref 36.0–46.0)
Hemoglobin: 11.6 g/dL — ABNORMAL LOW (ref 12.0–15.0)
MCH: 28.5 pg (ref 26.0–34.0)
MCHC: 33.3 g/dL (ref 30.0–36.0)
MCV: 85.5 fL (ref 78.0–100.0)
PLATELETS: 363 10*3/uL (ref 150–400)
RBC: 4.07 MIL/uL (ref 3.87–5.11)
RDW: 14.3 % (ref 11.5–15.5)
WBC: 11.1 10*3/uL — ABNORMAL HIGH (ref 4.0–10.5)

## 2014-07-12 LAB — GLUCOSE, CAPILLARY
GLUCOSE-CAPILLARY: 194 mg/dL — AB (ref 70–99)
Glucose-Capillary: 167 mg/dL — ABNORMAL HIGH (ref 70–99)
Glucose-Capillary: 175 mg/dL — ABNORMAL HIGH (ref 70–99)
Glucose-Capillary: 177 mg/dL — ABNORMAL HIGH (ref 70–99)
Glucose-Capillary: 198 mg/dL — ABNORMAL HIGH (ref 70–99)
Glucose-Capillary: 226 mg/dL — ABNORMAL HIGH (ref 70–99)

## 2014-07-12 LAB — MAGNESIUM: MAGNESIUM: 1.8 mg/dL (ref 1.5–2.5)

## 2014-07-12 LAB — LIPASE, BLOOD: Lipase: 63 U/L — ABNORMAL HIGH (ref 11–59)

## 2014-07-12 LAB — PHOSPHORUS: Phosphorus: 4.4 mg/dL (ref 2.3–4.6)

## 2014-07-12 MED ORDER — DOCUSATE SODIUM 50 MG/5ML PO LIQD
100.0000 mg | Freq: Every day | ORAL | Status: DC
  Administered 2014-07-13: 100 mg via ORAL
  Filled 2014-07-12: qty 10

## 2014-07-12 MED ORDER — FAT EMULSION 20 % IV EMUL
250.0000 mL | INTRAVENOUS | Status: AC
  Administered 2014-07-12: 250 mL via INTRAVENOUS
  Filled 2014-07-12: qty 250

## 2014-07-12 MED ORDER — TRACE MINERALS CR-CU-F-FE-I-MN-MO-SE-ZN IV SOLN
INTRAVENOUS | Status: AC
  Administered 2014-07-12: 17:00:00 via INTRAVENOUS
  Filled 2014-07-12: qty 2000

## 2014-07-12 MED ORDER — DEXTROSE-NACL 5-0.45 % IV SOLN
INTRAVENOUS | Status: AC
  Administered 2014-07-12: 09:00:00 via INTRAVENOUS

## 2014-07-12 MED ORDER — DEXTROSE-NACL 5-0.45 % IV SOLN
INTRAVENOUS | Status: DC
  Administered 2014-07-13 (×2): via INTRAVENOUS

## 2014-07-12 NOTE — Progress Notes (Signed)
NG tube clamped per MD order.  Pt encouraged to order clear liquid tray, will continue to monitor.

## 2014-07-12 NOTE — Progress Notes (Signed)
Progress Note   Kristin Meadows [id number] DOB: May 04, 1956 DOA: 07/04/2014 PCP: Ernestene Kiel, MD   Brief Narrative:   Kristin Meadows is an 58 y.o. female who underwent exploratory laparotomy, right salpingo-oophorectomy, omentectomy and tumor debulking 07/01/14 who presented to the hospital on 07/04/14 with a chief complaint of nausea and vomiting. Upon initial evaluation, the patient was found to have a small bowel obstruction, and was referred for inpatient management.  Assessment/Plan:   Principal Problem:   SBO (small bowel obstruction)  Treated conservatively with bowel rest, IV fluids, analgesics, and anti-emetics, but slow to resolve.  Repeat acute abdominal series done 07/10/14 showed ongoing ileus versus obstruction. NG tube subsequently placed.  Being followed by GYN oncology.  CT abdomen and pelvis negative for high-grade bowel obstruction/transition point.  Suspect NG tube can be discontinued today, but will defer to surgery.  Active Problems:   Protein calorie malnutrition  PICC line placed for TNA which was initiated 07/08/14.    Hypokalemia  Repleted.    Vaginal yeast infection  Status post Diflucan x1 07/08/14.    Type II or unspecified type diabetes mellitus without mention of complication, not stated as uncontrolled  Metformin currently on hold.  Being managed by insulin sensitive SSI. CBG range 109-226.    Hypertension  Cozaar and Lasix now on hold, NG tube in place.    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     Normocytic anemia  Mild. Likely anemia of chronic disease.    DVT Prophylaxis  Continue Lovenox.  Code Status: Full. Family Communication: Husband updated at bedside. Disposition Plan: Home when stable.   IV Access:    Peripheral IV   Procedures and diagnostic studies:    CT scan of  the abdomen and pelvis 07/05/14: Prominent fluid-filled dilated proximal small bowel. Distal small bowel decompressed. Intraperitoneal free fluid, more so on the left. Interval resection of the complex cyst in the pelvis.  Acute abdominal series 07/10/14: Persistent dilatation of proximal small bowel loops with paucity of colonic gas, question ileus versus SBO. Bibasilar effusions larger on left with left basilar atelectasis. Questionable medial right upper lobe infiltrate versus artifact, mass considered less likely as this was not definitely seen on 06/25/14, radiographic followup until resolution recommended to exclude underlying abnormalities.  One view of the abdomen 07/10/14: NG tube tip overlying the level of the mid stomach. SBO versus focal ileus.  CT abdomen and pelvis (enterography) 07/11/14: Nonspecific mid small bowel distention without focal transition point to suggest high-grade bowel obstruction. Similar volume of ascites with resolution of postsurgical pneumoperitoneum. Suggested nodularity anteriorly status post omentectomy was not clearly seen on the most recent study and may reflect a thrombosed varicosity. 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 This could be the sequela of pancreatitis as suggested on MRI. A mass involving the retropancreatic retroperitoneum cannot be excluded. Enlarging bilateral pleural effusions with increased bibasilar atelectasis. New mild bilateral hydronephrosis possibly related to bladder distention. No high grade ureteral obstruction. Some bladder wall thickening along the right aspect of the bladder dome.   Medical Consultants:    Dr. Everitt Amber, GYN-ONC   Other Consultants:    None.   Anti-Infectives:    Diflucan x17/28/15.  Subjective:   Kristin Meadows has a lot of discomfort from the NG tube. Very little NG output noted. No significant pain. Continues to report flatus and bowels moved  yesterday. States she  did not have any nausea/vomiting despite having large volume of CT contrast placed in her NG tube.  Objective:    Filed Vitals:   07/11/14 2226 07/12/14 0201 07/12/14 0300 07/12/14 0500  BP: 136/66 127/74  139/77  Pulse: 106 93  95  Temp: 98.8 F (37.1 C) 98.6 F (37 C)  98.4 F (36.9 C)  TempSrc: Oral Oral  Oral  Resp: 16 16  16   Height:      Weight:   69.945 kg (154 lb 3.2 oz)   SpO2: 96% 98%  95%    Intake/Output Summary (Last 24 hours) at 07/12/14 0756 Last data filed at 07/12/14 0457  Gross per 24 hour  Intake   2634 ml  Output    550 ml  Net   2084 ml    Exam: Gen:  NAD Cardiovascular:  RRR, 2/6 systolic ejection murmur left upper sternal border Respiratory:  Lungs CTAB Gastrointestinal:  Abdomen soft, NT/ND, + BS Extremities:  No C/E/C   Data Reviewed:    Labs: Basic Metabolic Panel:  Recent Labs Lab 07/07/14 0025 07/08/14 0422 07/08/14 1020 07/09/14 0427 07/10/14 1030 07/11/14 0555 07/12/14 0512  NA 138 141  --  136*  --  135* 137  K 4.1 3.8  --  3.6*  --  3.6* 4.5  CL 101 106  --  102  --  96 97  CO2 22 21  --  22  --  28 28  GLUCOSE 153* 110*  --  194*  --  187* 148*  BUN 11 10  --  3*  --  4* 9  CREATININE 0.44* 0.43*  --  0.41*  --  0.44* 0.50  CALCIUM 8.5 8.1*  --  8.3*  --  9.0 9.5  MG  --  1.9 1.9  --  1.7 1.7 1.8  PHOS  --  2.3  --   --   --  3.8 4.4   GFR Estimated Creatinine Clearance: 71.9 ml/min (by C-G formula based on Cr of 0.5). Liver Function Tests:  Recent Labs Lab 07/09/14 0427 07/11/14 0555 07/12/14 0512  AST 10 9 12   ALT 24 15 14   ALKPHOS 160* 129* 132*  BILITOT 0.5 0.4 0.4  PROT 5.2* 5.7* 6.5  ALBUMIN 2.1* 2.6* 2.8*    Recent Labs Lab 07/10/14 1030 07/11/14 0555 07/12/14 0512  LIPASE 60* 56 63*   CBC:  Recent Labs Lab 07/07/14 0025 07/09/14 0427 07/11/14 0555 07/12/14 0512  WBC 7.5 6.5 8.7 11.1*  NEUTROABS  --   --  6.1  --   HGB 10.0* 9.8* 10.4* 11.6*  HCT 31.0* 30.3* 30.8* 34.8*  MCV 88.1  86.3 84.8 85.5  PLT 262 242 299 363   CBG:  Recent Labs Lab 07/11/14 0814 07/11/14 1623 07/11/14 2008 07/12/14 0029 07/12/14 0418  GLUCAP 199* 203* 109* 226* 167*   Sepsis Labs:  Recent Labs Lab 07/07/14 0025 07/09/14 0427 07/11/14 0555 07/12/14 0512  WBC 7.5 6.5 8.7 11.1*   Microbiology No results found for this or any previous visit (from the past 240 hour(s)).   Medications:   . chlorhexidine  15 mL Mouth Rinse BID  . docusate sodium  100 mg Oral Daily  . enoxaparin  40 mg Subcutaneous Q24H  . famotidine (PEPCID) IV  20 mg Intravenous Q12H  . feeding supplement (RESOURCE BREEZE)  1 Container Oral TID BM  . insulin aspart  0-15 Units Subcutaneous 6 times per day  .  ketorolac  30 mg Intravenous 4 times per day  . losartan  100 mg Oral q morning - 10a  . senna-docusate  1 tablet Oral BID  . tamsulosin  0.4 mg Oral Daily   Continuous Infusions: . dextrose 5 % and 0.45 % NaCl with KCl 40 mEq/L 75 mL/hr at 07/11/14 2143  . Marland KitchenTPN (CLINIMIX-E) Adult 40 mL/hr at 07/11/14 1805   And  . fat emulsion 250 mL (07/11/14 1806)    Time spent: 25 minutes.   LOS: 8 days   [facility] Pager [phone]. If unable to reach me by pager, please call my cell phone at [phone].  *Please refer to [email or web address], password [email or web address] to get updated schedule on who will round on this patient, as hospitalists switch teams weekly. If 7PM-7AM, please contact night-coverage at [email or web address], password [email or web address] for any overnight needs.  07/12/2014, 7:56 AM    **Disclaimer: This note was dictated with voice recognition software. Similar sounding words can inadvertently be transcribed and this note may contain transcription errors which may not have been corrected upon publication of note.**

## 2014-07-12 NOTE — Progress Notes (Signed)
RN called into room for report of strange sound coming from NG tube.  Placement auscultated, and air not heard in stomach, Ng tube advanced and placement confirmed by auscultation.   MD notified.

## 2014-07-12 NOTE — Progress Notes (Signed)
Subjective: "Feel much better" today. Reports continued flatus. Several BM's in past 24 hours since oral contrast/enterogram. Less abdominal pain. Some nausea and gagging around NGT. No emesis around NGT.  Objective: Vital signs in last 24 hours: Temp:  [98.4 F (36.9 C)-98.8 F (37.1 C)] 98.4 F (36.9 C) (08/01 0500) Pulse Rate:  [93-109] 95 (08/01 0500) Resp:  [16-17] 16 (08/01 0500) BP: (127-139)/(66-77) 139/77 mmHg (08/01 0500) SpO2:  [95 %-98 %] 95 % (08/01 0500) Weight:  [154 lb 3.2 oz (69.945 kg)] 154 lb 3.2 oz (69.945 kg) (08/01 0300)  Intake/Output from previous day: 07/31 0701 - 08/01 0700 In: 2634 [I.V.:769] Out: 550  NGT put out 500cc in past 24 hours. Intake/Output this shift:    Abdomen: abnormal findings:  normal bowel sounds present, no tenderness, and softly distended. Incision c/d/i I: C/D/I Ext: NT, negative Homan's sign Results for orders placed during the hospital encounter of 07/04/14 (from the past 24 hour(s))  GLUCOSE, CAPILLARY     Status: Abnormal   Collection Time    07/11/14  4:23 PM      Result Value Ref Range   Glucose-Capillary 203 (*) 70 - 99 mg/dL   Comment 1 Documented in Chart     Comment 2 Notify RN    GLUCOSE, CAPILLARY     Status: Abnormal   Collection Time    07/11/14  8:08 PM      Result Value Ref Range   Glucose-Capillary 109 (*) 70 - 99 mg/dL   Comment 1 Notify RN    GLUCOSE, CAPILLARY     Status: Abnormal   Collection Time    07/12/14 12:29 AM      Result Value Ref Range   Glucose-Capillary 226 (*) 70 - 99 mg/dL   Comment 1 Notify RN    GLUCOSE, CAPILLARY     Status: Abnormal   Collection Time    07/12/14  4:18 AM      Result Value Ref Range   Glucose-Capillary 167 (*) 70 - 99 mg/dL   Comment 1 Notify RN    CBC     Status: Abnormal   Collection Time    07/12/14  5:12 AM      Result Value Ref Range   WBC 11.1 (*) 4.0 - 10.5 K/uL   RBC 4.07  3.87 - 5.11 MIL/uL   Hemoglobin 11.6 (*) 12.0 - 15.0 g/dL   HCT 34.8 (*) 36.0  - 46.0 %   MCV 85.5  78.0 - 100.0 fL   MCH 28.5  26.0 - 34.0 pg   MCHC 33.3  30.0 - 36.0 g/dL   RDW 14.3  11.5 - 15.5 %   Platelets 363  150 - 400 K/uL  LIPASE, BLOOD     Status: Abnormal   Collection Time    07/12/14  5:12 AM      Result Value Ref Range   Lipase 63 (*) 11 - 59 U/L  COMPREHENSIVE METABOLIC PANEL     Status: Abnormal   Collection Time    07/12/14  5:12 AM      Result Value Ref Range   Sodium 137  137 - 147 mEq/L   Potassium 4.5  3.7 - 5.3 mEq/L   Chloride 97  96 - 112 mEq/L   CO2 28  19 - 32 mEq/L   Glucose, Bld 148 (*) 70 - 99 mg/dL   BUN 9  6 - 23 mg/dL   Creatinine, Ser 0.50  0.50 - 1.10 mg/dL  Calcium 9.5  8.4 - 10.5 mg/dL   Total Protein 6.5  6.0 - 8.3 g/dL   Albumin 2.8 (*) 3.5 - 5.2 g/dL   AST 12  0 - 37 U/L   ALT 14  0 - 35 U/L   Alkaline Phosphatase 132 (*) 39 - 117 U/L   Total Bilirubin 0.4  0.3 - 1.2 mg/dL   GFR calc non Af Amer >90  >90 mL/min   GFR calc Af Amer >90  >90 mL/min   Anion gap 12  5 - 15  MAGNESIUM     Status: None   Collection Time    07/12/14  5:12 AM      Result Value Ref Range   Magnesium 1.8  1.5 - 2.5 mg/dL  PHOSPHORUS     Status: None   Collection Time    07/12/14  5:12 AM      Result Value Ref Range   Phosphorus 4.4  2.3 - 4.6 mg/dL  GLUCOSE, CAPILLARY     Status: Abnormal   Collection Time    07/12/14  7:48 AM      Result Value Ref Range   Glucose-Capillary 175 (*) 70 - 99 mg/dL  GLUCOSE, CAPILLARY     Status: Abnormal   Collection Time    07/12/14 11:48 AM      Result Value Ref Range   Glucose-Capillary 198 (*) 70 - 99 mg/dL    CBC    Component Value Date/Time   WBC 11.1* 07/12/2014 0512   RBC 4.07 07/12/2014 0512   HGB 11.6* 07/12/2014 0512   HCT 34.8* 07/12/2014 0512   PLT 363 07/12/2014 0512   MCV 85.5 07/12/2014 0512   MCH 28.5 07/12/2014 0512   MCHC 33.3 07/12/2014 0512   RDW 14.3 07/12/2014 0512   LYMPHSABS 1.4 07/11/2014 0555   MONOABS 0.9 07/11/2014 0555   EOSABS 0.3 07/11/2014 0555   BASOSABS 0.0 07/11/2014  0555   CMP     Component Value Date/Time   NA 137 07/12/2014 0512   K 4.5 07/12/2014 0512   CL 97 07/12/2014 0512   CO2 28 07/12/2014 0512   GLUCOSE 148* 07/12/2014 0512   BUN 9 07/12/2014 0512   CREATININE 0.50 07/12/2014 0512   CALCIUM 9.5 07/12/2014 0512   PROT 6.5 07/12/2014 0512   ALBUMIN 2.8* 07/12/2014 0512   AST 12 07/12/2014 0512   ALT 14 07/12/2014 0512   ALKPHOS 132* 07/12/2014 0512   BILITOT 0.4 07/12/2014 0512   GFRNONAA >90 07/12/2014 0512   GFRAA >90 07/12/2014 0512     Scheduled Meds: . chlorhexidine  15 mL Mouth Rinse BID  . [START ON 07/13/2014] docusate  100 mg Oral Daily  . enoxaparin  40 mg Subcutaneous Q24H  . famotidine (PEPCID) IV  20 mg Intravenous Q12H  . feeding supplement (RESOURCE BREEZE)  1 Container Oral TID BM  . insulin aspart  0-15 Units Subcutaneous 6 times per day  . ketorolac  30 mg Intravenous 4 times per day  . losartan  100 mg Oral q morning - 10a  . senna-docusate  1 tablet Oral BID  . tamsulosin  0.4 mg Oral Daily   Continuous Infusions: . dextrose 5 % and 0.45% NaCl 75 mL/hr at 07/12/14 0838  . dextrose 5 % and 0.45% NaCl    . Marland KitchenTPN (CLINIMIX-E) Adult 40 mL/hr at 07/11/14 1805   And  . fat emulsion 250 mL (07/11/14 1806)  . Marland KitchenTPN (CLINIMIX-E) Adult     And  .  fat emulsion     PRN Meds:acetaminophen, baclofen, HYDROmorphone (DILAUDID) injection, LORazepam, ondansetron (ZOFRAN) IV, ondansetron, oxyCODONE-acetaminophen, phenol, promethazine, sodium chloride  Assessment/Plan: Principal Problem:   SBO (small bowel obstruction), partial - appears to be resolving. Enterogram showed no high grade obstruction. Patient symptomatically improved since CT enterogram. Passing flatus and BM's.  Mild pancreatitis appears to be improved with bowel rest and TPN based on labs and symptoms.  Would clamp NGT and advance diet to clears. If she tolerates this with no emesis, can d.c. NGT in the morning and begin to advance diet again.   Active Problems: Severe malnutrition -  Continue TPN until d.c. And adequate PO intake.     Type II or unspecified type diabetes mellitus without mention of complication, not stated as uncontrolled   Hypertension   Right ovarian epithelial cancer   Normocytic anemia   Vaginal yeast infection   Hypokalemia CBGs < 200 Electrolytes stable  -->ambulate -->daily weights    LOS: 8 days   Donaciano Eva

## 2014-07-12 NOTE — Progress Notes (Signed)
PARENTERAL NUTRITION CONSULT NOTE - Follow up  Pharmacy Consult for TPN Indication: prolonged ileus  Allergies  Allergen Reactions  . Other     Silk tape-RASH  . Penicillins     "Paralyzed me"  . Sulfa Antibiotics     Rash    Patient Measurements: Height: 5' 2.5" (158.8 cm) Weight: 154 lb 3.2 oz (69.945 kg) IBW/kg (Calculated) : 51.25 BMI: 29.5 Adjusted Body Weight: 60.5 kg Usual Weight:   Vital Signs: Temp: 98.4 F (36.9 C) (08/01 0500) Temp src: Oral (08/01 0500) BP: 139/77 mmHg (08/01 0500) Pulse Rate: 95 (08/01 0500) Intake/Output from previous day: 07/31 0701 - 08/01 0700 In: 2634 [I.V.:769; NG/GT:1300; TPN:565] Out: 550 [Emesis/NG output:550] Intake/Output from this shift:    Labs:  Recent Labs  07/11/14 0555 07/12/14 0512  WBC 8.7 11.1*  HGB 10.4* 11.6*  HCT 30.8* 34.8*  PLT 299 363     Recent Labs  07/10/14 1030 07/11/14 0555 07/12/14 0512  NA  --  135* 137  K  --  3.6* 4.5  CL  --  96 97  CO2  --  28 28  GLUCOSE  --  187* 148*  BUN  --  4* 9  CREATININE  --  0.44* 0.50  CALCIUM  --  9.0 9.5  MG 1.7 1.7 1.8  PHOS  --  3.8 4.4  PROT  --  5.7* 6.5  ALBUMIN  --  2.6* 2.8*  AST  --  9 12  ALT  --  15 14  ALKPHOS  --  129* 132*  BILITOT  --  0.4 0.4  PREALBUMIN  --  12.3*  --   TRIG  --  164*  --    Estimated Creatinine Clearance: 71.9 ml/min (by C-G formula based on Cr of 0.5).    Recent Labs  07/11/14 2008 07/12/14 0029 07/12/14 0418  GLUCAP 109* 226* 167*    Medical History: Past Medical History  Diagnosis Date  . Hypertension   . Left bundle branch block (LBBB) age 17  . Anxiety   . Diabetes mellitus without complication     type 2  . GERD (gastroesophageal reflux disease)   . H/O hiatal hernia     small  . Arthritis   . Idiopathic neuropathy     biochemical  . Meningioma     x2    Medications:  Scheduled:  . chlorhexidine  15 mL Mouth Rinse BID  . docusate sodium  100 mg Oral Daily  . enoxaparin  40 mg  Subcutaneous Q24H  . famotidine (PEPCID) IV  20 mg Intravenous Q12H  . feeding supplement (RESOURCE BREEZE)  1 Container Oral TID BM  . insulin aspart  0-15 Units Subcutaneous 6 times per day  . ketorolac  30 mg Intravenous 4 times per day  . losartan  100 mg Oral q morning - 10a  . senna-docusate  1 tablet Oral BID  . tamsulosin  0.4 mg Oral Daily   Infusions:  . dextrose 5 % and 0.45 % NaCl with KCl 40 mEq/L 75 mL/hr at 07/11/14 2143  . Marland KitchenTPN (CLINIMIX-E) Adult 40 mL/hr at 07/11/14 1805   And  . fat emulsion 250 mL (07/11/14 1806)    Insulin Requirements in the past 24 hours: 12 units, moderate SSI  Current Nutrition: CLD - poor intake  IVF: D51/2NS 40KCL at 19m/hr  Assessment: 89yoF w/ ovarian cancer underwent exploratory laparotomy, right salpingo-oophorectomy, omentectomy and tumor debulking 7/21. Admitted 7/24 with nausea and  vomiting, suspected SBO. 7/30: Due to persistant N/V and abd xray showing ileus vs. obstruction, pharmacy was asked to start TPN. Noted hx of DM2. Prior to initial surgery she reported poor appetite and a 10lb weight loss over 2 weeks. At moderate risk for refeeding syndrome.  8/1:  Per RN, pt with BM overnight, continues to have flatus. Surgery recommends continuing NPO until NGT outputs < 300 cc/24h (yesterday ~450 cc/hr).  No issues overnight per RN.   Glucose - 109-266  Electrolytes - WNL, Phos near higher end of range, Mg ok.   LFTs - AST/ALT wnl. Alk phos slightly elevated.  TGs - 164 (7/31)  Prealbumin - 12.3 (7/31)  Nutritional Goals:  RD recs: 85-100 g/day protein, 1600-1800Kcal/day. Clinimix E 5/15 at a goal rate of 63m/hr + 20% fat emulsion at 134mhr to provide: 90g/day protein, 1758Kcal/day.  TPN Access: PICC TPN day#: 3  Plan:   Increase to  Clinimix E 5/15 at 6051mr.  20% fat emulsion at 85m47m.  Plan to advance as tolerated to the goal rate.   Concern for hyperglycemia.  Increase to 15units/L insulin to TPN.  TNA  to contain standard multivitamins and trace elements.  Reduce IVF to 50 ml/hr and remove potassium  Cont SSI moderate q4h.   Cmet, Phos tomorrow  TNA lab panels on Mondays & Thursdays.  F/u daily.  CollRalene BathearmD, BCPS 07/12/2014, 7:54 AM  Pager: [phone]

## 2014-07-13 LAB — GLUCOSE, CAPILLARY
GLUCOSE-CAPILLARY: 61 mg/dL — AB (ref 70–99)
GLUCOSE-CAPILLARY: 278 mg/dL — AB (ref 70–99)
GLUCOSE-CAPILLARY: 144 mg/dL — AB (ref 70–99)
GLUCOSE-CAPILLARY: 235 mg/dL — AB (ref 70–99)
Glucose-Capillary: 123 mg/dL — ABNORMAL HIGH (ref 70–99)
Glucose-Capillary: 205 mg/dL — ABNORMAL HIGH (ref 70–99)
Glucose-Capillary: 207 mg/dL — ABNORMAL HIGH (ref 70–99)

## 2014-07-13 LAB — COMPREHENSIVE METABOLIC PANEL
ALT: 11 U/L (ref 0–35)
ANION GAP: 11 (ref 5–15)
AST: 10 U/L (ref 0–37)
Albumin: 2.3 g/dL — ABNORMAL LOW (ref 3.5–5.2)
Alkaline Phosphatase: 103 U/L (ref 39–117)
BILIRUBIN TOTAL: 0.3 mg/dL (ref 0.3–1.2)
BUN: 13 mg/dL (ref 6–23)
CALCIUM: 9 mg/dL (ref 8.4–10.5)
CHLORIDE: 97 meq/L (ref 96–112)
CO2: 28 mEq/L (ref 19–32)
CREATININE: 0.49 mg/dL — AB (ref 0.50–1.10)
GFR calc Af Amer: >90 mL/min (ref >90)
GFR calc non Af Amer: >90 mL/min (ref >90)
Glucose, Bld: 164 mg/dL — ABNORMAL HIGH (ref 70–99)
Potassium: 4.3 mEq/L (ref 3.7–5.3)
Sodium: 136 mEq/L — ABNORMAL LOW (ref 137–147)
Total Protein: 5.7 g/dL — ABNORMAL LOW (ref 6.0–8.3)

## 2014-07-13 LAB — CBC
HCT: 31.4 % — ABNORMAL LOW (ref 36.0–46.0)
Hemoglobin: 10.3 g/dL — ABNORMAL LOW (ref 12.0–15.0)
MCH: 28.3 pg (ref 26.0–34.0)
MCHC: 32.8 g/dL (ref 30.0–36.0)
MCV: 86.3 fL (ref 78.0–100.0)
Platelets: 254 10*3/uL (ref 150–400)
RBC: 3.64 MIL/uL — AB (ref 3.87–5.11)
RDW: 14.2 % (ref 11.5–15.5)
WBC: 8.7 10*3/uL (ref 4.0–10.5)

## 2014-07-13 LAB — PHOSPHORUS: PHOSPHORUS: 4.7 mg/dL — AB (ref 2.3–4.6)

## 2014-07-13 LAB — LIPASE, BLOOD: LIPASE: 33 U/L (ref 11–59)

## 2014-07-13 MED ORDER — TRACE MINERALS CR-CU-F-FE-I-MN-MO-SE-ZN IV SOLN
INTRAVENOUS | Status: AC
  Administered 2014-07-13: 17:00:00 via INTRAVENOUS
  Filled 2014-07-13: qty 2000

## 2014-07-13 MED ORDER — FAT EMULSION 20 % IV EMUL
250.0000 mL | INTRAVENOUS | Status: AC
  Administered 2014-07-13: 250 mL via INTRAVENOUS
  Filled 2014-07-13: qty 250

## 2014-07-13 MED ORDER — DOCUSATE SODIUM 100 MG PO CAPS
100.0000 mg | ORAL_CAPSULE | Freq: Every day | ORAL | Status: DC
  Administered 2014-07-14 – 2014-07-15 (×2): 100 mg via ORAL
  Filled 2014-07-13 (×3): qty 1

## 2014-07-13 NOTE — Progress Notes (Signed)
Hypoglycemic Event  CBG: 61  Treatment: 15 GM carbohydrate snack  Symptoms: None  Follow-up CBG: Time:0821 CBG Result:207  Possible Reasons for Event: Unknown  Comments/MD notified:    Kristin Meadows  Remember to initiate Hypoglycemia Order Set & complete

## 2014-07-13 NOTE — Progress Notes (Signed)
Progress Note   Kristin Meadows LYY:[id number] DOB: 03-30-1956 DOA: 07/04/2014 PCP: Ernestene Kiel, MD   Brief Narrative:   Kristin Meadows is an 58 y.o. female who underwent exploratory laparotomy, right salpingo-oophorectomy, omentectomy and tumor debulking 07/01/14 who presented to the hospital on 07/04/14 with a chief complaint of nausea and vomiting. Upon initial evaluation, the patient was found to have a small bowel obstruction, and was referred for inpatient management.  Assessment/Plan:   Principal Problem:   SBO (small bowel obstruction)  Treated conservatively with bowel rest, IV fluids, analgesics, and anti-emetics, but slow to resolve.  Repeat acute abdominal series done 07/10/14 showed ongoing ileus versus obstruction. NG tube subsequently placed.  Being followed by GYN oncology.  CT abdomen and pelvis negative for high-grade bowel obstruction/transition point.  NG tube clamped 07/12/14.  Tolerated without vomiting. D/C NG tube today.  Continue CL diet.  Active Problems:   Protein calorie malnutrition  PICC line placed for TNA which was initiated 07/08/14.    Hypokalemia  Repleted.    Vaginal yeast infection  Status post Diflucan x1 07/08/14.    Type II or unspecified type diabetes mellitus without mention of complication, not stated as uncontrolled  Metformin currently on hold.  Being managed by insulin sensitive SSI. CBG range 123-205.    Hypertension  Cozaar and Lasix now on hold, NG tube in place.    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     Normocytic anemia  Mild. Likely anemia of chronic disease.    DVT Prophylaxis  Continue Lovenox.  Code Status: Full. Family Communication: Husband updated at bedside. Disposition Plan: Home when stable.   IV Access:    Peripheral IV   Procedures and diagnostic  studies:    CT scan of the abdomen and pelvis 07/05/14: Prominent fluid-filled dilated proximal small bowel. Distal small bowel decompressed. Intraperitoneal free fluid, more so on the left. Interval resection of the complex cyst in the pelvis.  Acute abdominal series 07/10/14: Persistent dilatation of proximal small bowel loops with paucity of colonic gas, question ileus versus SBO. Bibasilar effusions larger on left with left basilar atelectasis. Questionable medial right upper lobe infiltrate versus artifact, mass considered less likely as this was not definitely seen on 06/25/14, radiographic followup until resolution recommended to exclude underlying abnormalities.  One view of the abdomen 07/10/14: NG tube tip overlying the level of the mid stomach. SBO versus focal ileus.  CT abdomen and pelvis (enterography) 07/11/14: Nonspecific mid small bowel distention without focal transition point to suggest high-grade bowel obstruction. Similar volume of ascites with resolution of postsurgical pneumoperitoneum. Suggested nodularity anteriorly status post omentectomy was not clearly seen on the most recent study and may reflect a thrombosed varicosity. 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 This could be the sequela of pancreatitis as suggested on MRI. A mass involving the retropancreatic retroperitoneum cannot be excluded. Enlarging bilateral pleural effusions with increased bibasilar atelectasis. New mild bilateral hydronephrosis possibly related to bladder distention. No high grade ureteral obstruction. Some bladder wall thickening along the right aspect of the bladder dome.   Medical Consultants:    Dr. Everitt Amber, GYN-ONC   Other Consultants:    None.   Anti-Infectives:    Diflucan x17/28/15.  Subjective:   Kristin Meadows has had some mild nausea but no vomiting with clamping of NG tube.  Tolerated CL diet.  NG tube  causing significant discomfort.  Still  reports flatus.  Objective:    Filed Vitals:   07/12/14 1821 07/12/14 2027 07/13/14 0305 07/13/14 0621  BP: 128/64 141/69  129/67  Pulse: 98 102  87  Temp: 98.5 F (36.9 C) 97.1 F (36.2 C)  98.2 F (36.8 C)  TempSrc: Oral Oral  Oral  Resp: 16 16  16   Height:      Weight:   70.217 kg (154 lb 12.8 oz)   SpO2: 99% 96%  96%    Intake/Output Summary (Last 24 hours) at 07/13/14 0748 Last data filed at 07/12/14 1435  Gross per 24 hour  Intake   1050 ml  Output      0 ml  Net   1050 ml    Exam: Gen:  NAD Cardiovascular:  RRR, 2/6 systolic ejection murmur left upper sternal border Respiratory:  Lungs CTAB Gastrointestinal:  Abdomen soft, NT/ND, + BS Extremities:  No C/E/C   Data Reviewed:    Labs: Basic Metabolic Panel:  Recent Labs Lab 07/07/14 0025 07/08/14 0422 07/08/14 1020 07/09/14 0427 07/10/14 1030 07/11/14 0555 07/12/14 0512 07/13/14 0640  NA 138 141  --  136*  --  135* 137 136*  K 4.1 3.8  --  3.6*  --  3.6* 4.5 4.3  CL 101 106  --  102  --  96 97 97  CO2 22 21  --  22  --  28 28 28   GLUCOSE 153* 110*  --  194*  --  187* 148* 164*  BUN 11 10  --  3*  --  4* 9 13  CREATININE 0.44* 0.43*  --  0.41*  --  0.44* 0.50 0.49*  CALCIUM 8.5 8.1*  --  8.3*  --  9.0 9.5 9.0  MG  --  1.9 1.9  --  1.7 1.7 1.8  --   PHOS  --  2.3  --   --   --  3.8 4.4 4.7*   GFR Estimated Creatinine Clearance: 71.3 ml/min (by C-G formula based on Cr of 0.49). Liver Function Tests:  Recent Labs Lab 07/09/14 0427 07/11/14 0555 07/12/14 0512 07/13/14 0640  AST 10 9 12 10   ALT 24 15 14 11   ALKPHOS 160* 129* 132* 103  BILITOT 0.5 0.4 0.4 0.3  PROT 5.2* 5.7* 6.5 5.7*  ALBUMIN 2.1* 2.6* 2.8* 2.3*    Recent Labs Lab 07/10/14 1030 07/11/14 0555 07/12/14 0512 07/13/14 0640  LIPASE 60* 56 63* 33   CBC:  Recent Labs Lab 07/07/14 0025 07/09/14 0427 07/11/14 0555 07/12/14 0512 07/13/14 0640  WBC 7.5 6.5 8.7 11.1* 8.7  NEUTROABS  --   --  6.1  --   --   HGB  10.0* 9.8* 10.4* 11.6* 10.3*  HCT 31.0* 30.3* 30.8* 34.8* 31.4*  MCV 88.1 86.3 84.8 85.5 86.3  PLT 262 242 299 363 254   CBG:  Recent Labs Lab 07/12/14 1148 07/12/14 1659 07/12/14 2030 07/13/14 0011 07/13/14 0410  GLUCAP 198* 194* 177* 205* 123*   Sepsis Labs:  Recent Labs Lab 07/09/14 0427 07/11/14 0555 07/12/14 0512 07/13/14 0640  WBC 6.5 8.7 11.1* 8.7   Microbiology No results found for this or any previous visit (from the past 240 hour(s)).   Medications:   . chlorhexidine  15 mL Mouth Rinse BID  . docusate  100 mg Oral Daily  . enoxaparin  40 mg Subcutaneous Q24H  . famotidine (PEPCID) IV  20 mg Intravenous Q12H  . feeding  supplement (RESOURCE BREEZE)  1 Container Oral TID BM  . insulin aspart  0-15 Units Subcutaneous 6 times per day  . ketorolac  30 mg Intravenous 4 times per day  . losartan  100 mg Oral q morning - 10a  . senna-docusate  1 tablet Oral BID  . tamsulosin  0.4 mg Oral Daily   Continuous Infusions: . dextrose 5 % and 0.45% NaCl 50 mL/hr at 07/13/14 0219  . Marland KitchenTPN (CLINIMIX-E) Adult 60 mL/hr at 07/12/14 1722   And  . fat emulsion 250 mL (07/12/14 1723)    Time spent: 25 minutes.   LOS: 9 days   [facility] Pager [phone]. If unable to reach me by pager, please call my cell phone at [phone].  *Please refer to [email or web address], password [email or web address] to get updated schedule on who will round on this patient, as hospitalists switch teams weekly. If 7PM-7AM, please contact night-coverage at [email or web address], password [email or web address] for any overnight needs.  07/13/2014, 7:48 AM    **Disclaimer: This note was dictated with voice recognition software. Similar sounding words can inadvertently be transcribed and this note may contain transcription errors which may not have been corrected upon publication of note.**

## 2014-07-13 NOTE — Progress Notes (Signed)
PARENTERAL NUTRITION CONSULT NOTE - Follow up  Pharmacy Consult for TPN Indication: prolonged ileus  Allergies  Allergen Reactions  . Other     Silk tape-RASH  . Penicillins     "Paralyzed me"  . Sulfa Antibiotics     Rash    Patient Measurements: Height: 5' 2.5" (158.8 cm) Weight: 154 lb 12.8 oz (70.217 kg) IBW/kg (Calculated) : 51.25 BMI: 29.5 Adjusted Body Weight: 60.5 kg Usual Weight:   Vital Signs: Temp: 98.2 F (36.8 C) (08/02 0621) Temp src: Oral (08/02 0621) BP: 129/67 mmHg (08/02 0621) Pulse Rate: 87 (08/02 0621) Intake/Output from previous day: 08/01 0701 - 08/02 0700 In: 1050 [I.V.:600; IV Piggyback:50; TPN:400] Out: -  Intake/Output from this shift:    Labs:  Recent Labs  07/11/14 0555 07/12/14 0512 07/13/14 0640  WBC 8.7 11.1* 8.7  HGB 10.4* 11.6* 10.3*  HCT 30.8* 34.8* 31.4*  PLT 299 363 254     Recent Labs  07/10/14 1030 07/11/14 0555 07/12/14 0512 07/13/14 0640  NA  --  135* 137 136*  K  --  3.6* 4.5 4.3  CL  --  96 97 97  CO2  --  28 28 28  GLUCOSE  --  187* 148* 164*  BUN  --  4* 9 13  CREATININE  --  0.44* 0.50 0.49*  CALCIUM  --  9.0 9.5 9.0  MG 1.7 1.7 1.8  --   PHOS  --  3.8 4.4 4.7*  PROT  --  5.7* 6.5 5.7*  ALBUMIN  --  2.6* 2.8* 2.3*  AST  --  9 12 10  ALT  --  15 14 11  ALKPHOS  --  129* 132* 103  BILITOT  --  0.4 0.4 0.3  PREALBUMIN  --  12.3*  --   --   TRIG  --  164*  --   --    Estimated Creatinine Clearance: 71.3 ml/min (by C-G formula based on Cr of 0.49).    Recent Labs  07/12/14 2030 07/13/14 0011 07/13/14 0410  GLUCAP 177* 205* 123*    Medical History: Past Medical History  Diagnosis Date  . Hypertension   . Left bundle branch block (LBBB) age 37  . Anxiety   . Diabetes mellitus without complication     type 2  . GERD (gastroesophageal reflux disease)   . H/O hiatal hernia     small  . Arthritis   . Idiopathic neuropathy     biochemical  . Meningioma     x2    Medications:   Scheduled:  . chlorhexidine  15 mL Mouth Rinse BID  . docusate  100 mg Oral Daily  . enoxaparin  40 mg Subcutaneous Q24H  . famotidine (PEPCID) IV  20 mg Intravenous Q12H  . feeding supplement (RESOURCE BREEZE)  1 Container Oral TID BM  . insulin aspart  0-15 Units Subcutaneous 6 times per day  . ketorolac  30 mg Intravenous 4 times per day  . losartan  100 mg Oral q morning - 10a  . senna-docusate  1 tablet Oral BID  . tamsulosin  0.4 mg Oral Daily   Infusions:  . dextrose 5 % and 0.45% NaCl 50 mL/hr at 07/13/14 0219  . .TPN (CLINIMIX-E) Adult 60 mL/hr at 07/12/14 1722   And  . fat emulsion 250 mL (07/12/14 1723)    Insulin Requirements in the past 24 hours: 19 units, moderate SSI  Current Nutrition: CLD - poor intake  IVF:   D51/2NS at 50ml/hr  Assessment: 57yo F w/ ovarian cancer underwent exploratory laparotomy, right salpingo-oophorectomy, omentectomy and tumor debulking 7/21. Admitted 7/24 with nausea and vomiting, suspected SBO. 7/30: Due to persistant N/V and abd xray showing ileus vs. obstruction, pharmacy was asked to start TPN. Noted hx of DM2. Prior to initial surgery she reported poor appetite and a 10lb weight loss over 2 weeks. At moderate risk for refeeding syndrome.  8/2: Appears SBO resolving, pt continues passing flatus and BMs.  Yesterday diet advanced to clears, NGT clamped.   Glucose - fluctuating CBGs -61-205  Electrolytes - K 136 - slightly low, unable to adjust in premix clinimix. Phos rising, now slightly elevated.  Other lytes ok.    LFTs - AST/ALT and alk phos now WNL.    TGs - 164 (7/31)  Prealbumin - 12.3 (7/31)  Nutritional Goals:  RD recs: 85-100 g/day protein, 1600-1800Kcal/day. Clinimix E 5/15 at a goal rate of 75ml/hr + 20% fat emulsion at 10ml/hr to provide: 90g/day protein, 1758Kcal/day.  TPN Access: PICC TPN day#: 4  Plan:   Change to Clinimix 5/15 (without lytes) and continue at same rate 60ml/hr.  20% fat emulsion at  10ml/hr.  Plan to advance as tolerated to the goal rate.   Due to low CBG this AM, will decrease back to 10 units/L insulin in TPN.  TNA to contain standard multivitamins and trace elements.  Continue IVF at 50 ml/hr  Continue SSI moderate q4h.   TNA lab panels on Mondays & Thursdays.  F/u diet advancement  F/u daily  Colleen Summe, PharmD, BCPS 07/13/2014, 7:55 AM  Pager: [phone]  

## 2014-07-14 LAB — DIFFERENTIAL
BASOS PCT: 1 % (ref 0–1)
Basophils Absolute: 0.1 10*3/uL (ref 0.0–0.1)
Eosinophils Absolute: 0.4 10*3/uL (ref 0.0–0.7)
Eosinophils Relative: 5 % (ref 0–5)
LYMPHS ABS: 1.9 10*3/uL (ref 0.7–4.0)
Lymphocytes Relative: 23 % (ref 12–46)
Monocytes Absolute: 0.6 10*3/uL (ref 0.1–1.0)
Monocytes Relative: 7 % (ref 3–12)
NEUTROS ABS: 5.4 10*3/uL (ref 1.7–7.7)
NEUTROS PCT: 64 % (ref 43–77)

## 2014-07-14 LAB — TRIGLYCERIDES: Triglycerides: 177 mg/dL — ABNORMAL HIGH (ref <150)

## 2014-07-14 LAB — CBC
HCT: 30.4 % — ABNORMAL LOW (ref 36.0–46.0)
Hemoglobin: 9.6 g/dL — ABNORMAL LOW (ref 12.0–15.0)
MCH: 27.7 pg (ref 26.0–34.0)
MCHC: 31.6 g/dL (ref 30.0–36.0)
MCV: 87.6 fL (ref 78.0–100.0)
Platelets: 215 10*3/uL (ref 150–400)
RBC: 3.47 MIL/uL — ABNORMAL LOW (ref 3.87–5.11)
RDW: 14.2 % (ref 11.5–15.5)
WBC: 8.3 10*3/uL (ref 4.0–10.5)

## 2014-07-14 LAB — PREALBUMIN: PREALBUMIN: 12.2 mg/dL — AB (ref 17.0–34.0)

## 2014-07-14 LAB — GLUCOSE, CAPILLARY
GLUCOSE-CAPILLARY: 232 mg/dL — AB (ref 70–99)
GLUCOSE-CAPILLARY: 207 mg/dL — AB (ref 70–99)
GLUCOSE-CAPILLARY: 177 mg/dL — AB (ref 70–99)
Glucose-Capillary: 204 mg/dL — ABNORMAL HIGH (ref 70–99)
Glucose-Capillary: 215 mg/dL — ABNORMAL HIGH (ref 70–99)
Glucose-Capillary: 254 mg/dL — ABNORMAL HIGH (ref 70–99)

## 2014-07-14 LAB — COMPREHENSIVE METABOLIC PANEL
ALT: 10 U/L (ref 0–35)
AST: 9 U/L (ref 0–37)
Albumin: 2.4 g/dL — ABNORMAL LOW (ref 3.5–5.2)
Alkaline Phosphatase: 101 U/L (ref 39–117)
Anion gap: 10 (ref 5–15)
BUN: 11 mg/dL (ref 6–23)
CALCIUM: 8.7 mg/dL (ref 8.4–10.5)
CO2: 26 mEq/L (ref 19–32)
Chloride: 98 mEq/L (ref 96–112)
Creatinine, Ser: 0.49 mg/dL — ABNORMAL LOW (ref 0.50–1.10)
GFR calc non Af Amer: >90 mL/min (ref >90)
GLUCOSE: 255 mg/dL — AB (ref 70–99)
Potassium: 4 mEq/L (ref 3.7–5.3)
Sodium: 134 mEq/L — ABNORMAL LOW (ref 137–147)
TOTAL PROTEIN: 5.5 g/dL — AB (ref 6.0–8.3)
Total Bilirubin: 0.3 mg/dL (ref 0.3–1.2)

## 2014-07-14 LAB — PHOSPHORUS: Phosphorus: 3 mg/dL (ref 2.3–4.6)

## 2014-07-14 LAB — MAGNESIUM: MAGNESIUM: 1.7 mg/dL (ref 1.5–2.5)

## 2014-07-14 LAB — LIPASE, BLOOD: LIPASE: 30 U/L (ref 11–59)

## 2014-07-14 MED ORDER — INSULIN ASPART 100 UNIT/ML ~~LOC~~ SOLN
0.0000 [IU] | Freq: Three times a day (TID) | SUBCUTANEOUS | Status: DC
  Administered 2014-07-14: 4 [IU] via SUBCUTANEOUS
  Administered 2014-07-14 – 2014-07-15 (×2): 7 [IU] via SUBCUTANEOUS

## 2014-07-14 MED ORDER — HYDROMORPHONE HCL PF 2 MG/ML IJ SOLN
INTRAMUSCULAR | Status: AC
  Filled 2014-07-14: qty 1

## 2014-07-14 MED ORDER — INSULIN ASPART 100 UNIT/ML ~~LOC~~ SOLN
0.0000 [IU] | SUBCUTANEOUS | Status: DC
Start: 2014-07-14 — End: 2014-07-14
  Administered 2014-07-14: 11 [IU] via SUBCUTANEOUS

## 2014-07-14 NOTE — Progress Notes (Signed)
Progress Note   Kristin Meadows UXN:[id number] DOB: 24-Feb-1956 DOA: 07/04/2014 PCP: Ernestene Kiel, MD   Brief Narrative:   Kristin Meadows is an 58 y.o. female who underwent exploratory laparotomy, right salpingo-oophorectomy, omentectomy and tumor debulking 07/01/14 who presented to the hospital on 07/04/14 with a chief complaint of nausea and vomiting. Upon initial evaluation, the patient was found to have a small bowel obstruction, and was referred for inpatient management.  She has been slow to resolve her SBO, and has required TNA for nutritional support.  TNA being weaned and diet advanced to low residue today in hopes of possible D/C home 07/15/14.  Assessment/Plan:   Principal Problem:   SBO (small bowel obstruction)  Treated conservatively with bowel rest, IV fluids, analgesics, and anti-emetics, but slow to resolve.  Repeat acute abdominal series done 07/10/14 showed ongoing ileus versus obstruction. NG tube subsequently placed.  Being followed by GYN oncology.  CT abdomen and pelvis negative for high-grade bowel obstruction/transition point.  NG tube clamped 07/12/14 and discontinued 07/13/14.  Diet successfully advanced to FL by 07/13/14, which she has tolerated.  Advanced to low residue today.  Wean TNA.  Active Problems:   Protein calorie malnutrition  PICC line placed for TNA which was initiated 07/08/14.    Hypokalemia  Repleted.    Vaginal yeast infection  Status post Diflucan x1 07/08/14.    Type II or unspecified type diabetes mellitus without mention of complication, not stated as uncontrolled  Metformin currently on hold.  Being managed by insulin sensitive SSI. CBG range 144-235.    Hypertension  Cozaar and Lasix now on hold, NG tube in place.    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     Normocytic anemia  Mild. Likely  anemia of chronic disease.    DVT Prophylaxis  Continue Lovenox.  Code Status: Full. Family Communication: Husband updated at bedside 07/13/14. Disposition Plan: Home when stable.   IV Access:    Peripheral IV   Procedures and diagnostic studies:    CT scan of the abdomen and pelvis 07/05/14: Prominent fluid-filled dilated proximal small bowel. Distal small bowel decompressed. Intraperitoneal free fluid, more so on the left. Interval resection of the complex cyst in the pelvis.  Acute abdominal series 07/10/14: Persistent dilatation of proximal small bowel loops with paucity of colonic gas, question ileus versus SBO. Bibasilar effusions larger on left with left basilar atelectasis. Questionable medial right upper lobe infiltrate versus artifact, mass considered less likely as this was not definitely seen on 06/25/14, radiographic followup until resolution recommended to exclude underlying abnormalities.  One view of the abdomen 07/10/14: NG tube tip overlying the level of the mid stomach. SBO versus focal ileus.  CT abdomen and pelvis (enterography) 07/11/14: Nonspecific mid small bowel distention without focal transition point to suggest high-grade bowel obstruction. Similar volume of ascites with resolution of postsurgical pneumoperitoneum. Suggested nodularity anteriorly status post omentectomy was not clearly seen on the most recent study and may reflect a thrombosed varicosity. 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 This could be the sequela of pancreatitis as suggested on MRI. A mass involving the retropancreatic retroperitoneum cannot be excluded. Enlarging bilateral pleural effusions with increased bibasilar atelectasis. New mild bilateral hydronephrosis possibly related to bladder distention. No high grade ureteral obstruction. Some bladder wall thickening along the right aspect of the bladder dome.   Medical Consultants:  Dr. Everitt Amber,  GYN-ONC   Other Consultants:    None.   Anti-Infectives:    Diflucan x17/28/15.  Subjective:   Kristin Meadows feels well.  Still requiring dilaudid at times for abdominal pain.  Bowels moved yesterday.  Passing flatus.  Mild nausea, no vomiting.  Objective:    Filed Vitals:   07/13/14 1745 07/13/14 2200 07/14/14 0251 07/14/14 0500  BP: 139/76 135/75 130/55 126/63  Pulse: 94 96 88 95  Temp: 98.3 F (36.8 C) 98.3 F (36.8 C) 98.3 F (36.8 C) 98.3 F (36.8 C)  TempSrc: Oral Oral Oral Oral  Resp: 18 20 20 20   Height:      Weight:    69.718 kg (153 lb 11.2 oz)  SpO2: 100% 100% 97% 98%    Intake/Output Summary (Last 24 hours) at 07/14/14 0756 Last data filed at 07/14/14 0600  Gross per 24 hour  Intake   2090 ml  Output      0 ml  Net   2090 ml    Exam: Gen:  NAD Cardiovascular:  RRR, 2/6 systolic ejection murmur left upper sternal border Respiratory:  Lungs CTAB Gastrointestinal:  Abdomen soft, NT/ND, + BS Extremities:  No C/E/C   Data Reviewed:    Labs: Basic Metabolic Panel:  Recent Labs Lab 07/08/14 0422 07/08/14 1020 07/09/14 0427 07/10/14 1030 07/11/14 0555 07/12/14 0512 07/13/14 0640 07/14/14 0510  NA 141  --  136*  --  135* 137 136* 134*  K 3.8  --  3.6*  --  3.6* 4.5 4.3 4.0  CL 106  --  102  --  96 97 97 98  CO2 21  --  22  --  28 28 28 26   GLUCOSE 110*  --  194*  --  187* 148* 164* 255*  BUN 10  --  3*  --  4* 9 13 11   CREATININE 0.43*  --  0.41*  --  0.44* 0.50 0.49* 0.49*  CALCIUM 8.1*  --  8.3*  --  9.0 9.5 9.0 8.7  MG 1.9 1.9  --  1.7 1.7 1.8  --  1.7  PHOS 2.3  --   --   --  3.8 4.4 4.7* 3.0   GFR Estimated Creatinine Clearance: 71 ml/min (by C-G formula based on Cr of 0.49). Liver Function Tests:  Recent Labs Lab 07/09/14 0427 07/11/14 0555 07/12/14 0512 07/13/14 0640 07/14/14 0510  AST 10 9 12 10 9   ALT 24 15 14 11 10   ALKPHOS 160* 129* 132* 103 101  BILITOT 0.5 0.4 0.4 0.3 0.3  PROT 5.2* 5.7* 6.5 5.7* 5.5*   ALBUMIN 2.1* 2.6* 2.8* 2.3* 2.4*    Recent Labs Lab 07/10/14 1030 07/11/14 0555 07/12/14 0512 07/13/14 0640 07/14/14 0510  LIPASE 60* 56 63* 33 30   CBC:  Recent Labs Lab 07/09/14 0427 07/11/14 0555 07/12/14 0512 07/13/14 0640 07/14/14 0510  WBC 6.5 8.7 11.1* 8.7 8.3  NEUTROABS  --  6.1  --   --  5.4  HGB 9.8* 10.4* 11.6* 10.3* 9.6*  HCT 30.3* 30.8* 34.8* 31.4* 30.4*  MCV 86.3 84.8 85.5 86.3 87.6  PLT 242 299 363 254 215   CBG:  Recent Labs Lab 07/13/14 1617 07/13/14 2008 07/14/14 0047 07/14/14 0444 07/14/14 0751  GLUCAP 144* 235* 204* 232* 215*   Sepsis Labs:  Recent Labs Lab 07/11/14 0555 07/12/14 0512 07/13/14 0640 07/14/14 0510  WBC 8.7 11.1* 8.7 8.3   Microbiology No results found for this or  any previous visit (from the past 240 hour(s)).   Medications:   . chlorhexidine  15 mL Mouth Rinse BID  . docusate sodium  100 mg Oral Daily  . enoxaparin  40 mg Subcutaneous Q24H  . famotidine (PEPCID) IV  20 mg Intravenous Q12H  . feeding supplement (RESOURCE BREEZE)  1 Container Oral TID BM  . insulin aspart  0-15 Units Subcutaneous 6 times per day  . losartan  100 mg Oral q morning - 10a  . senna-docusate  1 tablet Oral BID  . tamsulosin  0.4 mg Oral Daily   Continuous Infusions: . dextrose 5 % and 0.45% NaCl 50 mL/hr at 07/13/14 2304  . TPN (CLINIMIX) Adult without lytes 60 mL/hr at 07/13/14 1716   And  . fat emulsion 250 mL (07/13/14 1716)    Time spent: 25 minutes.   LOS: 10 days   [facility] Pager [phone]. If unable to reach me by pager, please call my cell phone at [phone].  *Please refer to [email or web address], password [email or web address] to get updated schedule on who will round on this patient, as hospitalists switch teams weekly. If 7PM-7AM, please contact night-coverage at [email or web address], password [email or web address] for any overnight needs.  07/14/2014, 7:56 AM    **Disclaimer: This note was dictated with voice recognition software.  Similar sounding words can inadvertently be transcribed and this note may contain transcription errors which may not have been corrected upon publication of note.**

## 2014-07-14 NOTE — Progress Notes (Signed)
NUTRITION FOLLOW UP  Intervention:   -Recommend trial of [employer] Instant Breakfast with meals -Discontinue Resource Breeze supplement -Encouraged compliance with low fiber diet; reviewed basics and encouraged to contact RD with additional questions/concerns -Will continue to monitor  Nutrition Dx:   Inadequate oral intake related to decreased appetite/nausea/vomiting as evidenced by PO intake < 75%, wt loss; improving with diet advancement    Goal:   Pt to meet >/= 90% of their estimated nutrition needs via PO, progressing    Monitor:   TPN wean, Total protein/energy intake, labs, weights, GI profile  Assessment:   7/30: -Pt endorsed an unintentional wt loss of 12- 14 lbs since 12/2013. Pharmacy noted pt had lost 10 (out of 14 lbs) two week prior to initial surgery on 7/21 (approximtately 5-6% body weight loss, significant for time frame). Current body weight may be elevated d/t fluids  -Has had decreased PO intake d/t nausea, vomiting, and early satiety. Diet recall indicates pt normally consumes oatmeal or biscuit for breakfast, salad or sandwich for lunch and small light dinner. Portion sizes have significantly decreased in past 6 months. Does not snack or consume nutrition supplements at home  -Has been unable to tolerate clear liquid diet d/t vomiting. Has Resource Breeze ordered, pt consuming < 25% d/t nausea/vomiting. MD noted pt with possible ileus vs obstruction, NGT placed on LIS. TPN to be initiated  -Pt at moderate risk for refeeding d/t minimal PO intake for past one week, decreased intake pta, and unintentional wt loss. K low-being repleted. Mg WNL and Phos pending  -Has hx of DM2, CBGs slightly elevated at 194  -Pharmacy noted Clinimix E 5/15 to be started at 40 ml/hr, with 20% fat emulsion at 10 ml/hr.  -Plan to advance to goal rate of 80 ml/rh with 20% fat emulsion at 10 ml/hr to provide 1843 kcal (100% est kcal needs), and 96 gram protein (100% est protein  needs)  8/03: -Diet advanced to full liquid to 8/02. Denied nausea/abd pain post meals. Has not been consuming [employer] d/t dislike of taste. Diet advanced to Carb Modified on 8/03. Pt had not yet ordered lunch during time of RD follow up. Encouraged compliance with low fiber diet, recommend intake of well cooked fruits, vegetables and minimize intake of whole grains, seed, and nuts -Pt apprehensive towards Glucerna/Boost/Ensure as pt noted they taste too sweet. Was willing to try [employer]. Relayed to RN and Patient Risk analyst to allow on DM diet -Also noted pt could trial use of instant coffee granules into supplements to decrease sweetness upon d/c -NGT removed on 8/02 -TPN was unable to reach goal rate d/t elevated CBGs per discussion with pharmacy. Clinimix 5/15 currently running at 60 ml/hr (goal rate of 75 ml/hr).Has hx of DM2 and D5W infusing; both of which (along with TPN) likely contributed to CBS > 200 mg/dl.   -Phos elevated on 8/02, TPN modified from Clinimix 5/15 E to Clinimix 5/15. Phos/K/Mg currently WNL. -Plan to wean TPN with diet advancement  Height: Ht Readings from Last 1 Encounters:  07/05/14 5' 2.5" (1.588 m)    Weight Status:   Wt Readings from Last 1 Encounters:  07/14/14 153 lb 11.2 oz (69.718 kg)    Re-estimated needs:  Kcal: 1600-1800  Protein: 85-100 gram  Fluid: >/=1600 ml/daily   Skin: WDL  Diet Order: Carb Control   Intake/Output Summary (Last 24 hours) at 07/14/14 1207 Last data filed at 07/14/14 0900  Gross per 24 hour  Intake  2330 ml  Output      0 ml  Net   2330 ml    Last BM: 8/02   Labs:   Recent Labs Lab 07/11/14 0555 07/12/14 0512 07/13/14 0640 07/14/14 0510  NA 135* 137 136* 134*  K 3.6* 4.5 4.3 4.0  CL 96 97 97 98  CO2 28 28 28 26   BUN 4* 9 13 11   CREATININE 0.44* 0.50 0.49* 0.49*  CALCIUM 9.0 9.5 9.0 8.7  MG 1.7 1.8  --  1.7  PHOS 3.8 4.4 4.7* 3.0  GLUCOSE 187* 148* 164* 255*     CBG (last 3)   Recent Labs  07/14/14 0047 07/14/14 0444 07/14/14 0751  GLUCAP 204* 232* 215*    Scheduled Meds: . chlorhexidine  15 mL Mouth Rinse BID  . docusate sodium  100 mg Oral Daily  . enoxaparin  40 mg Subcutaneous Q24H  . famotidine (PEPCID) IV  20 mg Intravenous Q12H  . feeding supplement (RESOURCE BREEZE)  1 Container Oral TID BM  . insulin aspart  0-15 Units Subcutaneous 6 times per day  . losartan  100 mg Oral q morning - 10a  . senna-docusate  1 tablet Oral BID  . tamsulosin  0.4 mg Oral Daily    Continuous Infusions: . dextrose 5 % and 0.45% NaCl 50 mL/hr at 07/13/14 2304  . TPN (CLINIMIX) Adult without lytes 60 mL/hr at 07/13/14 1716   And  . fat emulsion 250 mL (07/13/14 1716)    Atlee Abide MS RD LDN Clinical Dietitian [phone]

## 2014-07-14 NOTE — Progress Notes (Signed)
Subjective: Continues to feel well, improved. Reports continued flatus. Several BM's in past 24 hours (slightly loose). No abdominal pain. Tolerating full liquid diet without emesis or nausea.  Objective: Vital signs in last 24 hours: Temp:  [98 F (36.7 C)-98.3 F (36.8 C)] 98.3 F (36.8 C) (08/03 0500) Pulse Rate:  [88-98] 95 (08/03 0500) Resp:  [16-20] 20 (08/03 0500) BP: (114-139)/(55-76) 126/63 mmHg (08/03 0500) SpO2:  [96 %-100 %] 98 % (08/03 0500) Weight:  [153 lb 11.2 oz (69.718 kg)] 153 lb 11.2 oz (69.718 kg) (08/03 0500)  Intake/Output from previous day: 08/02 0701 - 08/03 0700 In: 2090 [P.O.:240; I.V.:1800] Out: -  NGT put out 500cc in past 24 hours. Intake/Output this shift:    Abdomen: abnormal findings:  normal bowel sounds present, no tenderness, and softly distended. Incision c/d/i I: C/D/I Ext: NT, negative Homan's sign Results for orders placed during the hospital encounter of 07/04/14 (from the past 24 hour(s))  GLUCOSE, CAPILLARY     Status: Abnormal   Collection Time    07/13/14 11:56 AM      Result Value Ref Range   Glucose-Capillary 278 (*) 70 - 99 mg/dL  GLUCOSE, CAPILLARY     Status: Abnormal   Collection Time    07/13/14  4:17 PM      Result Value Ref Range   Glucose-Capillary 144 (*) 70 - 99 mg/dL   Comment 1 Documented in Chart     Comment 2 Notify RN    GLUCOSE, CAPILLARY     Status: Abnormal   Collection Time    07/13/14  8:08 PM      Result Value Ref Range   Glucose-Capillary 235 (*) 70 - 99 mg/dL   Comment 1 Notify RN    GLUCOSE, CAPILLARY     Status: Abnormal   Collection Time    07/14/14 12:47 AM      Result Value Ref Range   Glucose-Capillary 204 (*) 70 - 99 mg/dL   Comment 1 Notify RN    GLUCOSE, CAPILLARY     Status: Abnormal   Collection Time    07/14/14  4:44 AM      Result Value Ref Range   Glucose-Capillary 232 (*) 70 - 99 mg/dL   Comment 1 Notify RN    COMPREHENSIVE METABOLIC PANEL     Status: Abnormal   Collection  Time    07/14/14  5:10 AM      Result Value Ref Range   Sodium 134 (*) 137 - 147 mEq/L   Potassium 4.0  3.7 - 5.3 mEq/L   Chloride 98  96 - 112 mEq/L   CO2 26  19 - 32 mEq/L   Glucose, Bld 255 (*) 70 - 99 mg/dL   BUN 11  6 - 23 mg/dL   Creatinine, Ser 0.49 (*) 0.50 - 1.10 mg/dL   Calcium 8.7  8.4 - 10.5 mg/dL   Total Protein 5.5 (*) 6.0 - 8.3 g/dL   Albumin 2.4 (*) 3.5 - 5.2 g/dL   AST 9  0 - 37 U/L   ALT 10  0 - 35 U/L   Alkaline Phosphatase 101  39 - 117 U/L   Total Bilirubin 0.3  0.3 - 1.2 mg/dL   GFR calc non Af Amer >90  >90 mL/min   GFR calc Af Amer >90  >90 mL/min   Anion gap 10  5 - 15  MAGNESIUM     Status: None   Collection Time    07/14/14  5:10 AM      Result Value Ref Range   Magnesium 1.7  1.5 - 2.5 mg/dL  PHOSPHORUS     Status: None   Collection Time    07/14/14  5:10 AM      Result Value Ref Range   Phosphorus 3.0  2.3 - 4.6 mg/dL  DIFFERENTIAL     Status: None   Collection Time    07/14/14  5:10 AM      Result Value Ref Range   Neutrophils Relative % 64  43 - 77 %   Neutro Abs 5.4  1.7 - 7.7 K/uL   Lymphocytes Relative 23  12 - 46 %   Lymphs Abs 1.9  0.7 - 4.0 K/uL   Monocytes Relative 7  3 - 12 %   Monocytes Absolute 0.6  0.1 - 1.0 K/uL   Eosinophils Relative 5  0 - 5 %   Eosinophils Absolute 0.4  0.0 - 0.7 K/uL   Basophils Relative 1  0 - 1 %   Basophils Absolute 0.1  0.0 - 0.1 K/uL  TRIGLYCERIDES     Status: Abnormal   Collection Time    07/14/14  5:10 AM      Result Value Ref Range   Triglycerides 177 (*) <150 mg/dL  CBC     Status: Abnormal   Collection Time    07/14/14  5:10 AM      Result Value Ref Range   WBC 8.3  4.0 - 10.5 K/uL   RBC 3.47 (*) 3.87 - 5.11 MIL/uL   Hemoglobin 9.6 (*) 12.0 - 15.0 g/dL   HCT 30.4 (*) 36.0 - 46.0 %   MCV 87.6  78.0 - 100.0 fL   MCH 27.7  26.0 - 34.0 pg   MCHC 31.6  30.0 - 36.0 g/dL   RDW 14.2  11.5 - 15.5 %   Platelets 215  150 - 400 K/uL  LIPASE, BLOOD     Status: None   Collection Time     07/14/14  5:10 AM      Result Value Ref Range   Lipase 30  11 - 59 U/L  GLUCOSE, CAPILLARY     Status: Abnormal   Collection Time    07/14/14  7:51 AM      Result Value Ref Range   Glucose-Capillary 215 (*) 70 - 99 mg/dL    CBC    Component Value Date/Time   WBC 8.3 07/14/2014 0510   RBC 3.47* 07/14/2014 0510   HGB 9.6* 07/14/2014 0510   HCT 30.4* 07/14/2014 0510   PLT 215 07/14/2014 0510   MCV 87.6 07/14/2014 0510   MCH 27.7 07/14/2014 0510   MCHC 31.6 07/14/2014 0510   RDW 14.2 07/14/2014 0510   LYMPHSABS 1.9 07/14/2014 0510   MONOABS 0.6 07/14/2014 0510   EOSABS 0.4 07/14/2014 0510   BASOSABS 0.1 07/14/2014 0510   CMP     Component Value Date/Time   NA 134* 07/14/2014 0510   K 4.0 07/14/2014 0510   CL 98 07/14/2014 0510   CO2 26 07/14/2014 0510   GLUCOSE 255* 07/14/2014 0510   BUN 11 07/14/2014 0510   CREATININE 0.49* 07/14/2014 0510   CALCIUM 8.7 07/14/2014 0510   PROT 5.5* 07/14/2014 0510   ALBUMIN 2.4* 07/14/2014 0510   AST 9 07/14/2014 0510   ALT 10 07/14/2014 0510   ALKPHOS 101 07/14/2014 0510   BILITOT 0.3 07/14/2014 0510   GFRNONAA >90 07/14/2014 0510   GFRAA >90 07/14/2014  0510     Scheduled Meds: . chlorhexidine  15 mL Mouth Rinse BID  . docusate sodium  100 mg Oral Daily  . enoxaparin  40 mg Subcutaneous Q24H  . famotidine (PEPCID) IV  20 mg Intravenous Q12H  . feeding supplement (RESOURCE BREEZE)  1 Container Oral TID BM  . insulin aspart  0-15 Units Subcutaneous 6 times per day  . losartan  100 mg Oral q morning - 10a  . senna-docusate  1 tablet Oral BID  . tamsulosin  0.4 mg Oral Daily   Continuous Infusions: . dextrose 5 % and 0.45% NaCl 50 mL/hr at 07/13/14 2304  . TPN (CLINIMIX) Adult without lytes 60 mL/hr at 07/13/14 1716   And  . fat emulsion 250 mL (07/13/14 1716)   PRN Meds:acetaminophen, baclofen, HYDROmorphone (DILAUDID) injection, LORazepam, ondansetron (ZOFRAN) IV, ondansetron, oxyCODONE-acetaminophen, phenol, promethazine, sodium chloride  Assessment/Plan: Principal Problem:    SBO (small bowel obstruction), partial - resolved. Recommend advancing diet to low residue and d.c TPN and PICC.  Active Problems: Severe malnutrition - has been receiving TPN in past 3 days while resolving SBO.     Type II or unspecified type diabetes mellitus without mention of complication, not stated as uncontrolled   Hypertension   Right ovarian epithelial cancer   Normocytic anemia   Vaginal yeast infection   Hypokalemia CBGs < 200 Electrolytes stable  -->ambulate -->daily weights  Likely home later today. Followup with Dr Marko Plume for chemotherapy later this week. From my perspective she is medically fit to receive chemotherapy.   LOS: 10 days   Donaciano Eva

## 2014-07-14 NOTE — Progress Notes (Signed)
PARENTERAL NUTRITION CONSULT NOTE - Follow up  Pharmacy Consult for TPN Indication: prolonged ileus  Allergies  Allergen Reactions  . Other     Silk tape-RASH  . Penicillins     "Paralyzed me"  . Sulfa Antibiotics     Rash    Patient Measurements: Height: 5' 2.5" (158.8 cm) Weight: 153 lb 11.2 oz (69.718 kg) IBW/kg (Calculated) : 51.25 BMI: 29.5 Adjusted Body Weight: 60.5 kg  Vital Signs: Temp: 98.3 F (36.8 C) (08/03 0500) Temp src: Oral (08/03 0500) BP: 126/63 mmHg (08/03 0500) Pulse Rate: 95 (08/03 0500) Intake/Output from previous day: 08/02 0701 - 08/03 0700 In: 2090 [P.O.:240; I.V.:1800; IV Piggyback:50] Out: -  Intake/Output from this shift:    Labs:  Recent Labs  07/12/14 0512 07/13/14 0640 07/14/14 0510  WBC 11.1* 8.7 8.3  HGB 11.6* 10.3* 9.6*  HCT 34.8* 31.4* 30.4*  PLT 363 254 215     Recent Labs  07/12/14 0512 07/13/14 0640 07/14/14 0510  NA 137 136* 134*  K 4.5 4.3 4.0  CL 97 97 98  CO2 _0 GLUCOSE 148* 164* 255*  BUN _1 CREATININE 0.50 0.49* 0.49*  CALCIUM 9.5 9.0 8.7  MG 1.8  --  1.7  PHOS 4.4 4.7* 3.0  PROT 6.5 5.7* 5.5*  ALBUMIN 2.8* 2.3* 2.4*  AST _2 ALT _3 ALKPHOS 132* 103 101  BILITOT 0.4 0.3 0.3  TRIG  --   --  177*   Estimated Creatinine Clearance: 71 ml/min (by C-G formula based on Cr of 0.49).    Recent Labs  07/14/14 0047 07/14/14 0444 07/14/14 0751  GLUCAP 204* 232* 215*    Medical History: Past Medical History  Diagnosis Date  . Hypertension   . Left bundle branch block (LBBB) age 66  . Anxiety   . Diabetes mellitus without complication     type 2  . GERD (gastroesophageal reflux disease)   . H/O hiatal hernia     small  . Arthritis   . Idiopathic neuropathy     biochemical  . Meningioma     x2    Medications:  Scheduled:  . chlorhexidine  15 mL Mouth Rinse BID  . docusate sodium  100 mg Oral Daily  . enoxaparin  40 mg Subcutaneous Q24H  . famotidine (PEPCID)  IV  20 mg Intravenous Q12H  . feeding supplement (RESOURCE BREEZE)  1 Container Oral TID BM  . insulin aspart  0-15 Units Subcutaneous 6 times per day  . losartan  100 mg Oral q morning - 10a  . senna-docusate  1 tablet Oral BID  . tamsulosin  0.4 mg Oral Daily   Infusions:  . dextrose 5 % and 0.45% NaCl 50 mL/hr at 07/13/14 2304  . TPN (CLINIMIX) Adult without lytes 60 mL/hr at 07/13/14 1716   And  . fat emulsion 250 mL (07/13/14 1716)    Insulin Requirements in the past 24 hours:  15 units of moderate SSI 14.4 units delivered in TNA  Current Nutrition: FLD - increasing intake  IVF: D5 1/2NS at 85m/hr  Assessment: 89yoF w/ ovarian cancer underwent exploratory laparotomy, right salpingo-oophorectomy, omentectomy and tumor debulking 7/21. Admitted 7/24 with nausea and vomiting, suspected SBO. 7/30: Due to persistant N/V and abd xray showing ileus vs. obstruction, pharmacy was asked to start TPN. Noted hx of DM2. Prior to initial surgery she reported poor appetite and a 10lb weight loss over 2 weeks  PTA.  8/3: SBO resolving, pt continues passing flatus and BMs.  Yesterday diet advanced to full liquids, NGT removed, eating solids this AM, patient tolerated breakfast and lunch, TNA to wean to off   Glucose - elevated CBGs 204-235  Electrolytes - Na 134 - slightly decreased, unable to adjust in premix clinimix. Phos now WNL at 3.0.  Other lytes ok.    LFTs - AST/ALT and alk phos now WNL.    TGs - 177 (8/3), 164 (7/31)  Prealbumin - 12.3 (7/31)  Nutritional Goals:  RD recs: 85-100 g/day protein, 1600-1800Kcal/day. Clinimix E 5/15 at a goal rate of 76m/hr + 20% fat emulsion at 131mhr to provide: 90g/day protein, 1758Kcal/day.  TPN Access: PICC TPN day#: 5  Plan:   Wean TNA to 40 ml/hr at 1700, to turn OFF at 1800  20% fat emulsion at 1066mr to stop at 1800  Continue IVF at 50 ml/hr  Change SSI to resistant TID WC and HS  Discontinue TNA lab panel on Mondays &  Thursdays.  Discontinue TNA consult  Pharmacy will sign off  Thank you for the consult.  JesCurrie ParisharmD, BCPS Pager: [phone]: [phone] 2:18 PM

## 2014-07-15 LAB — LIPASE, BLOOD: Lipase: 24 U/L (ref 11–59)

## 2014-07-15 LAB — CBC
HCT: 30.2 % — ABNORMAL LOW (ref 36.0–46.0)
Hemoglobin: 9.9 g/dL — ABNORMAL LOW (ref 12.0–15.0)
MCH: 28.5 pg (ref 26.0–34.0)
MCHC: 32.8 g/dL (ref 30.0–36.0)
MCV: 87 fL (ref 78.0–100.0)
Platelets: 201 10*3/uL (ref 150–400)
RBC: 3.47 MIL/uL — AB (ref 3.87–5.11)
RDW: 14.1 % (ref 11.5–15.5)
WBC: 9.8 10*3/uL (ref 4.0–10.5)

## 2014-07-15 LAB — GLUCOSE, CAPILLARY: GLUCOSE-CAPILLARY: 207 mg/dL — AB (ref 70–99)

## 2014-07-15 MED ORDER — DSS 100 MG PO CAPS: 100.0000 mg | ORAL_CAPSULE | Freq: Two times a day (BID) | ORAL | Status: DC | PRN

## 2014-07-15 MED ORDER — OXYCODONE-ACETAMINOPHEN 5-325 MG PO TABS: 1.0000 | ORAL_TABLET | ORAL | Status: DC | PRN

## 2014-07-15 MED ORDER — PROMETHAZINE HCL 25 MG PO TABS: 25.0000 mg | ORAL_TABLET | Freq: Four times a day (QID) | ORAL | Status: DC | PRN

## 2014-07-15 NOTE — Discharge Instructions (Signed)
Ileus The intestine (bowel, or gut) is a long, muscular tube connecting your stomach to your rectum. If the intestine stops working, food cannot pass through. This is called an ileus. This can happen for a variety of reasons. Ileus is a major medical problem that usually requires hospitalization. If your intestine stops working because of a blockage, this is called a bowel obstruction and is a different condition. CAUSES   Surgery in your abdomen. This can last from a few hours to a few days.  An infection or inflammation in the belly (abdomen). This includes inflammation of the lining of the abdomen (peritonitis).  Infection or inflammation in other parts of the body, such as pneumonia or pancreatitis.  Passage of gallstones or kidney stones.  Damage to the nerves or blood vessels which go to the bowel.  Imbalance in the salts in the blood (electrolytes).  Injury to the brain and/or spinal cord.  Medications. Many medications can cause ileus or make it worse. The most common of these are strong pain medications. SYMPTOMS  Symptoms of bowel obstruction come from the bowel inactivity. They may include:  Bloating. Your belly gets bigger (distension).  Pain or discomfort in the abdomen.  Poor appetite, feeling sick to your stomach (nausea), and vomiting.  You may also not be able to hear your normal bowel sounds, such as "growling" in your stomach. DIAGNOSIS   Your history and a physical exam will usually suggest to your caregiver that you have an ileus.  X-rays or a CT scan of your abdomen will confirm the diagnosis. X-rays, CT scans, and lab tests may also suggest the cause. TREATMENT   Rest the intestine until it starts working again. This is most often accomplished by:  Stopping intake of oral food and drink. Dehydration is prevented by using IV (intravenous) fluids.  Sometimes, a nasogastric tube (NG tube) is needed. This is a narrow plastic tube inserted through your nose  and into your stomach. It is connected to suction to keep the stomach emptied out. This also helps treat the nausea and vomiting.  If there is an imbalance in the electrolytes, they are corrected with supplements in your intravenous fluids.  Medications that might make an ileus worse might be stopped.  There are no medications that reliably treat ileus, though your caregiver may suggest a trial of certain medications.  If your condition is slow to resolve, you will be reevaluated to be sure another condition, such as a blockage, is not present. Ileus is common and usually has a good outcome. Depending on the cause of your ileus, it usually can be treated by your caregivers with good results. Sometimes, specialists (surgeons or gastroenterologists) are asked to assist in your care.  HOME CARE INSTRUCTIONS   Follow your caregiver's instructions regarding diet and fluid intake. This will usually include drinking plenty of clear fluids, avoiding alcohol and caffeine, and eating a gentle diet.  Follow your caregiver's instructions regarding activity. A period of rest is sometimes advised before returning to work or school.  Take only medications prescribed by your caregiver. Be especially careful with narcotic pain medication, which can slow your bowel activity and contribute to ileus.  Keep any follow-up appointments with your caregiver or specialists. SEEK MEDICAL CARE IF:   You have a recurrence of nausea, vomiting, or abdominal discomfort.  You develop fever of more than 102 F (38.9 C). SEEK IMMEDIATE MEDICAL CARE IF:   You have severe abdominal pain.  You are unable to keep  fluids down. °Document Released: 12/01/2003 Document Revised: 04/14/2014 Document Reviewed: 04/02/2009 °ExitCare® Patient Information ©2015 [employer]. This information is not intended to replace advice given to you by your health care provider. Make sure you discuss any questions you have with your health care  provider. °Small Bowel Obstruction °A small bowel obstruction is a blockage (obstruction) of the small intestine (small bowel). The small bowel is a long, slender tube that connects the stomach to the colon. Its job is to absorb nutrients from the fluids and foods you consume into the bloodstream.  °CAUSES  °There are many causes of intestinal blockage. The most common ones include: °· Hernias. This is a more common cause in children than adults. °· Inflammatory bowel disease (enteritis and colitis). °· Twisting of the bowel (volvulus). °· Tumors. °· Scar tissue (adhesions) from previous surgery or radiation treatment. °· Recent surgery. This may cause an acute small bowel obstruction called an ileus. °SYMPTOMS  °· Abdominal pain. This may be dull cramps or sharp pain. It may occur in one area or may be present in the entire abdomen. Pain can range from mild to severe, depending on the degree of obstruction. °· Nausea and vomiting. Vomit may be greenish or yellow bile color. °· Distended or swollen stomach. Abdominal bloating is a common symptom. °· Constipation. °· Lack of passing gas. °· Frequent belching. °· Diarrhea. This may occur if runny stool is able to leak around the obstruction. °DIAGNOSIS  °Your caregiver can usually diagnose small bowel obstruction by taking a history, doing a physical exam, and taking X-rays. If the cause is unclear, a CT scan (computerized tomography) of your abdomen and pelvis may be needed. °TREATMENT  °Treatment of the blockage depends on the cause and how bad the problem is.  °· Sometimes, the obstruction improves with bed rest and intravenous (IV) fluids. °· Resting the bowel is very important. This means following a simple diet. Sometimes, a clear liquid diet may be required for several days. °· Sometimes, a small tube (nasogastric tube) is placed into the stomach to decompress the bowel. When the bowel is blocked, it usually swells up like a balloon filled with air and fluids.  Decompression means that the air and fluids are removed by suction through that tube. This can help with pain, discomfort, and nausea. It can also help the obstruction resolve faster. °· Surgery may be required if other treatments do not work. Bowel obstruction from a hernia may require early surgery and can be an emergency procedure. Adhesions that cause frequent or severe obstructions may also require surgery. °HOME CARE INSTRUCTIONS °If your bowel obstruction is only partial or incomplete, you may be allowed to go home. °· Get plenty of rest. °· Follow your diet as directed by your caregiver. °· Only consume clear liquids until your condition improves. °· Avoid solid foods as instructed. °SEEK IMMEDIATE MEDICAL CARE IF: °· You have increased pain or cramping. °· You vomit blood. °· You have uncontrolled vomiting or nausea. °· You cannot drink fluids due to vomiting or pain. °· You develop confusion. °· You begin feeling very dry or thirsty (dehydrated). °· You have severe bloating. °· You have chills. °· You have a fever. °· You feel extremely weak or you faint. °MAKE SURE YOU: °· Understand these instructions. °· Will watch your condition. °· Will get help right away if you are not doing well or get worse. °Document Released: 02/14/2006 Document Revised: 02/20/2012 Document Reviewed: 02/11/2011 °ExitCare® Patient Information ©2015 [employer].   This information is not intended to replace advice given to you by your health care provider. Make sure you discuss any questions you have with your health care provider. ° °

## 2014-07-15 NOTE — Discharge Summary (Signed)
Physician Discharge Summary  Kristin Meadows RJJ:[id number] DOB: 06-09-1956 DOA: 07/04/2014  PCP: Ernestene Kiel, MD  Admit date: 07/04/2014 Discharge date: 07/15/2014  Recommendations for Outpatient Follow-up:  1. Please follow up with Dr. Hans Eden on 8/10 and PCP 8/17. Blood work in cancer center or PCP office.  Discharge Diagnoses:  Principal Problem:   SBO (small bowel obstruction) Active Problems:   Type II or unspecified type diabetes mellitus without mention of complication, not stated as uncontrolled   Hypertension   Right ovarian epithelial cancer   Normocytic anemia   Vaginal yeast infection   Hypokalemia   Malnutrition of moderate degree    Discharge Condition: stable   Diet recommendation: as tolerated   History of present illness:  58 y.o. female who underwent exploratory laparotomy, right salpingo-oophorectomy, omentectomy and tumor debulking 07/01/14 who presented to the hospital on 07/04/14 with a chief complaint of nausea and vomiting. Upon initial evaluation, the patient was found to have a small bowel obstruction, and was referred for inpatient management. She has been slow to resolve her SBO, and has required TNA for nutritional support.   Assessment/Plan:   Principal Problem:  SBO (small bowel obstruction)  Treated conservatively with bowel rest, IV fluids, analgesics, and anti-emetics, but slow to resolve. She did require NG tube placement. NG tube clamped 07/12/14 and discontinued 07/13/14. GYN oncology was following throughout the hospital stay. This am she feels better and has tolerated PO intake. Pt stable for discharge.  Please note that the CT abdomen and pelvis was negative for high-grade bowel obstruction/transition point.  TNA stopped.  Active Problems:  Protein calorie malnutrition  PICC line placed for TNA which was initiated 07/08/14. Removed prior to discharge. Hypokalemia  Repleted. Vaginal yeast infection  Status post Diflucan x1 07/08/14. Type  II or unspecified type diabetes mellitus without mention of complication, not stated as uncontrolled  Metformin was on hold due to NPO  Being managed by insulin sensitive SSI while in hospital. 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  Normocytic anemia  Mild. Likely anemia of chronic disease. DVT Prophylaxis  Continue Lovenox while pt is in hospital    Code Status: Full.  Family Communication: Husband updated at bedside 07/13/14.   IV Access:   Peripheral IV Procedures and diagnostic studies:   CT scan of the abdomen and pelvis 07/05/14: Prominent fluid-filled dilated proximal small bowel. Distal small bowel decompressed. Intraperitoneal free fluid, more so on the left. Interval resection of the complex cyst in the pelvis.  Acute abdominal series 07/10/14: Persistent dilatation of proximal small bowel loops with paucity of colonic gas, question ileus versus SBO. Bibasilar effusions larger on left with left basilar atelectasis. Questionable medial right upper lobe infiltrate versus artifact, mass considered less likely as this was not definitely seen on 06/25/14, radiographic followup until resolution recommended to exclude underlying abnormalities.  One view of the abdomen 07/10/14: NG tube tip overlying the level of the mid stomach. SBO versus focal ileus.  CT abdomen and pelvis (enterography) 07/11/14: Nonspecific mid small bowel distention without focal transition point to suggest high-grade bowel obstruction. Similar volume of ascites with resolution of postsurgical pneumoperitoneum. Suggested nodularity anteriorly status post omentectomy was not clearly seen on the most recent study and may reflect a thrombosed varicosity. 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 This could be the sequela of  pancreatitis as suggested  on MRI. A mass involving the retropancreatic retroperitoneum cannot be excluded. Enlarging bilateral pleural effusions with increased bibasilar atelectasis. New mild bilateral hydronephrosis possibly related to bladder distention. No high grade ureteral obstruction. Some bladder wall thickening along the right aspect of the bladder dome. Medical Consultants:   Dr. Everitt Amber, GYN-ONC Other Consultants:   None.  SignedLeisa Lenz, MD  [facility] 07/15/2014, 9:51 AM  Pager #: [phone]   Discharge Exam: Filed Vitals:   07/15/14 0553  BP: 131/71  Pulse: 91  Temp: 98.3 F (36.8 C)  Resp: 20   Filed Vitals:   07/14/14 1056 07/14/14 1447 07/14/14 2036 07/15/14 0553  BP: 142/67 137/65 136/62 131/71  Pulse: 88 91 97 91  Temp: 98.2 F (36.8 C) 98.5 F (36.9 C) 98.4 F (36.9 C) 98.3 F (36.8 C)  TempSrc: Oral Oral Oral Oral  Resp: 18 18 18 20   Height:      Weight:    70.308 kg (155 lb)  SpO2: 98% 100% 100% 96%    General: Pt is alert, follows commands appropriately, not in acute distress Cardiovascular: Regular rate and rhythm, S1/S2 +, no murmurs Respiratory: Clear to auscultation bilaterally, no wheezing, no crackles, no rhonchi Abdominal: Soft, non tender, non distended, bowel sounds +, no guarding Extremities: no edema, no cyanosis, pulses palpable bilaterally DP and PT Neuro: Grossly nonfocal  Discharge Instructions  Discharge Instructions   Call MD for:  difficulty breathing, headache or visual disturbances    Complete by:  As directed      Call MD for:  persistant dizziness or light-headedness    Complete by:  As directed      Call MD for:  persistant nausea and vomiting    Complete by:  As directed      Call MD for:  severe uncontrolled pain    Complete by:  As directed      Call MD for:    Complete by:  As directed   Intractable nausea and vomiting. May call floor unit [phone] (can ask for Dr. Leisa Lenz)     Diet -  low sodium heart healthy    Complete by:  As directed      Increase activity slowly    Complete by:  As directed             Medication List         acetaminophen 500 MG tablet  Commonly known as:  TYLENOL  Take 2 tablets (1,000 mg total) by mouth 4 (four) times daily.     aspirin 81 MG chewable tablet  Chew 1 tablet (81 mg total) by mouth daily.     baclofen 10 MG tablet  Commonly known as:  LIORESAL  Take 10 mg by mouth 3 (three) times daily as needed for muscle spasms.     DSS 100 MG Caps  Take 100 mg by mouth 2 (two) times daily as needed for mild constipation.     enoxaparin 40 MG/0.4ML injection  Commonly known as:  LOVENOX  Inject 0.4 mLs (40 mg total) into the skin daily.     etodolac 600 MG 24 hr tablet  Commonly known as:  LODINE XL  Take 1 tablet (600 mg total) by mouth daily.     fluticasone 50 MCG/ACT nasal spray  Commonly known as:  FLONASE  Place into both nostrils daily.     furosemide 20 MG tablet  Commonly known as:  LASIX  Take 20 mg by mouth  daily as needed for edema.     LORazepam 0.5 MG tablet  Commonly known as:  ATIVAN  Take 0.5 mg by mouth every 8 (eight) hours as needed for anxiety.     losartan 100 MG tablet  Commonly known as:  COZAAR  Take 100 mg by mouth every morning.     metFORMIN 500 MG 24 hr tablet  Commonly known as:  GLUCOPHAGE-XR  Take 1,000 mg by mouth 2 (two) times daily.     omeprazole 20 MG capsule  Commonly known as:  PRILOSEC  Take 20 mg by mouth daily.     oxyCODONE-acetaminophen 5-325 MG per tablet  Commonly known as:  PERCOCET/ROXICET  Take 1-2 tablets by mouth every 4 (four) hours as needed for severe pain.     oxymorphone 10 MG 12 hr tablet  Commonly known as:  OPANA ER  Take 1 tablet (10 mg total) by mouth every 12 (twelve) hours.     promethazine 25 MG tablet  Commonly known as:  PHENERGAN  Take 1 tablet (25 mg total) by mouth every 6 (six) hours as needed for nausea or vomiting.     tamsulosin 0.4  MG Caps capsule  Commonly known as:  FLOMAX  Take 0.4 mg by mouth daily.           Follow-up Information   Follow up with [facility], MD On 07/28/2014. (Follow up appt after recent hospitalization)    Specialty:  Internal Medicine   Contact information:   [address] [place] [address] [phone]        The results of significant diagnostics from this hospitalization (including imaging, microbiology, ancillary and laboratory) are listed below for reference.    Microbiology: No results found for this or any previous visit (from the past 240 hour(s)).   Labs: Basic Metabolic Panel:  Recent Labs Lab 07/08/14 1020 07/09/14 0427 07/10/14 1030 07/11/14 0555 07/12/14 0512 07/13/14 0640 07/14/14 0510  NA  --  136*  --  135* 137 136* 134*  K  --  3.6*  --  3.6* 4.5 4.3 4.0  CL  --  102  --  96 97 97 98  CO2  --  22  --  28 28 28 26   GLUCOSE  --  194*  --  187* 148* 164* 255*  BUN  --  3*  --  4* 9 13 11   CREATININE  --  0.41*  --  0.44* 0.50 0.49* 0.49*  CALCIUM  --  8.3*  --  9.0 9.5 9.0 8.7  MG 1.9  --  1.7 1.7 1.8  --  1.7  PHOS  --   --   --  3.8 4.4 4.7* 3.0   Liver Function Tests:  Recent Labs Lab 07/09/14 0427 07/11/14 0555 07/12/14 0512 07/13/14 0640 07/14/14 0510  AST 10 9 12 10 9   ALT 24 15 14 11 10   ALKPHOS 160* 129* 132* 103 101  BILITOT 0.5 0.4 0.4 0.3 0.3  PROT 5.2* 5.7* 6.5 5.7* 5.5*  ALBUMIN 2.1* 2.6* 2.8* 2.3* 2.4*    Recent Labs Lab 07/11/14 0555 07/12/14 0512 07/13/14 0640 07/14/14 0510 07/15/14 0540  LIPASE 56 63* 33 30 24   No results found for this basename: AMMONIA,  in the last 168 hours CBC:  Recent Labs Lab 07/11/14 0555 07/12/14 0512 07/13/14 0640 07/14/14 0510 07/15/14 0540  WBC 8.7 11.1* 8.7 8.3 9.8  NEUTROABS 6.1  --   --  5.4  --  HGB 10.4* 11.6* 10.3* 9.6* 9.9*  HCT 30.8* 34.8* 31.4* 30.4* 30.2*  MCV 84.8 85.5 86.3 87.6 87.0  PLT 299 363 254 215 201   Cardiac Enzymes: No results found for  this basename: CKTOTAL, CKMB, CKMBINDEX, TROPONINI,  in the last 168 hours BNP: BNP (last 3 results) No results found for this basename: PROBNP,  in the last 8760 hours CBG:  Recent Labs Lab 07/14/14 0751 07/14/14 1222 07/14/14 1601 07/14/14 2111 07/15/14 0739  GLUCAP 215* 254* 207* 177* 207*    Time coordinating discharge: Over 30 minutes

## 2014-07-15 NOTE — Progress Notes (Signed)
Nursing Discharge Summary  Patient ID: Kristin Meadows MRN: [id number] DOB/AGE: [birth year] 58 y.o.  Admit date: 07/04/2014 Discharge date: 07/15/2014  Discharged Condition: good  Disposition: 01-Home or Self Care  Follow-up Information   Follow up with [facility], MD On 07/28/2014. (Follow up appt after recent hospitalization)    Specialty:  Internal Medicine   Contact information:   [address] [place] [address] [phone]       Follow up with Gordy Levan, MD On 07/21/2014.   Specialty:  Oncology   Contact information:   [address] [place] [address] [phone]       Prescriptions Given: Three - phenergan, colace & percocet  Means of Discharge: Home with husband via car. Waited thirty minutes after central line removal; no complications.   Signed: Juanetta Snow 07/15/2014, 11:39 AM

## 2014-07-16 ENCOUNTER — Other Ambulatory Visit: Payer: Managed Care, Other (non HMO)

## 2014-07-16 ENCOUNTER — Encounter: Payer: Self-pay | Admitting: *Deleted

## 2014-07-17 ENCOUNTER — Ambulatory Visit (HOSPITAL_COMMUNITY): Payer: Managed Care, Other (non HMO)

## 2014-07-21 ENCOUNTER — Ambulatory Visit: Payer: Managed Care, Other (non HMO)

## 2014-07-21 ENCOUNTER — Ambulatory Visit (HOSPITAL_BASED_OUTPATIENT_CLINIC_OR_DEPARTMENT_OTHER): Payer: Managed Care, Other (non HMO) | Admitting: Oncology

## 2014-07-21 ENCOUNTER — Other Ambulatory Visit (HOSPITAL_BASED_OUTPATIENT_CLINIC_OR_DEPARTMENT_OTHER): Payer: Managed Care, Other (non HMO)

## 2014-07-21 ENCOUNTER — Other Ambulatory Visit: Payer: Self-pay | Admitting: Oncology

## 2014-07-21 ENCOUNTER — Other Ambulatory Visit: Payer: Self-pay | Admitting: *Deleted

## 2014-07-21 ENCOUNTER — Encounter: Payer: Self-pay | Admitting: Oncology

## 2014-07-21 VITALS — BP 112/45 | HR 108 | Temp 97.6°F | Resp 20 | Ht 62.5 in | Wt 150.2 lb

## 2014-07-21 DIAGNOSIS — K869 Disease of pancreas, unspecified: Secondary | ICD-10-CM

## 2014-07-21 DIAGNOSIS — C561 Malignant neoplasm of right ovary: Secondary | ICD-10-CM

## 2014-07-21 DIAGNOSIS — B373 Candidiasis of vulva and vagina: Secondary | ICD-10-CM

## 2014-07-21 DIAGNOSIS — I10 Essential (primary) hypertension: Secondary | ICD-10-CM

## 2014-07-21 DIAGNOSIS — C801 Malignant (primary) neoplasm, unspecified: Secondary | ICD-10-CM

## 2014-07-21 DIAGNOSIS — B3731 Acute candidiasis of vulva and vagina: Secondary | ICD-10-CM

## 2014-07-21 DIAGNOSIS — E119 Type 2 diabetes mellitus without complications: Secondary | ICD-10-CM

## 2014-07-21 DIAGNOSIS — C786 Secondary malignant neoplasm of retroperitoneum and peritoneum: Secondary | ICD-10-CM

## 2014-07-21 DIAGNOSIS — R1900 Intra-abdominal and pelvic swelling, mass and lump, unspecified site: Secondary | ICD-10-CM

## 2014-07-21 LAB — CBC WITH DIFFERENTIAL/PLATELET
BASO%: 1.9 % (ref 0.0–2.0)
BASOS ABS: 0.1 10*3/uL (ref 0.0–0.1)
EOS%: 3.3 % (ref 0.0–7.0)
Eosinophils Absolute: 0.2 10*3/uL (ref 0.0–0.5)
HCT: 34.5 % — ABNORMAL LOW (ref 34.8–46.6)
HEMOGLOBIN: 11.2 g/dL — AB (ref 11.6–15.9)
LYMPH%: 21.4 % (ref 14.0–49.7)
MCH: 28.2 pg (ref 25.1–34.0)
MCHC: 32.3 g/dL (ref 31.5–36.0)
MCV: 87.2 fL (ref 79.5–101.0)
MONO#: 0.4 10*3/uL (ref 0.1–0.9)
MONO%: 7.1 % (ref 0.0–14.0)
NEUT#: 4.1 10*3/uL (ref 1.5–6.5)
NEUT%: 66.3 % (ref 38.4–76.8)
Platelets: 200 10*3/uL (ref 145–400)
RBC: 3.95 10*6/uL (ref 3.70–5.45)
RDW: 14.1 % (ref 11.2–14.5)
WBC: 6.1 10*3/uL (ref 3.9–10.3)
lymph#: 1.3 10*3/uL (ref 0.9–3.3)

## 2014-07-21 LAB — COMPREHENSIVE METABOLIC PANEL
ALK PHOS: 139 U/L — AB (ref 39–117)
ALT: 15 U/L (ref 0–35)
AST: 14 U/L (ref 0–37)
Albumin: 3.2 g/dL — ABNORMAL LOW (ref 3.5–5.2)
BUN: 15 mg/dL (ref 6–23)
CALCIUM: 9.7 mg/dL (ref 8.4–10.5)
CO2: 28 mEq/L (ref 19–32)
CREATININE: 0.58 mg/dL (ref 0.50–1.10)
Chloride: 95 mEq/L — ABNORMAL LOW (ref 96–112)
Glucose, Bld: 200 mg/dL — ABNORMAL HIGH (ref 70–99)
POTASSIUM: 4.5 meq/L (ref 3.5–5.3)
Sodium: 136 mEq/L (ref 135–145)
Total Bilirubin: 0.5 mg/dL (ref 0.2–1.2)
Total Protein: 6.8 g/dL (ref 6.0–8.3)

## 2014-07-21 MED ORDER — OXYCODONE-ACETAMINOPHEN 5-325 MG PO TABS: 1.0000 | ORAL_TABLET | Freq: Four times a day (QID) | ORAL | Status: DC | PRN

## 2014-07-21 MED ORDER — FLUCONAZOLE 100 MG PO TABS: ORAL_TABLET | ORAL | Status: DC

## 2014-07-21 MED ORDER — OXYMORPHONE HCL ER 10 MG PO TB12: 10.0000 mg | ORAL_TABLET | Freq: Two times a day (BID) | ORAL | Status: DC

## 2014-07-21 NOTE — Progress Notes (Signed)
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 History is from outside records from PCP/gyn/scans which I have reviewed in their entirety, this EMR, discussion directly with Dr Denman George  and from patient/ husband now.   HISTORY OF PRESENT ILLNESS:Kristin Meadows is a 58 y.o. 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 Situation is complicated by imaging and intraoperative findings of abnormal pancreas, and by bowel obstruction in post operative period. She is diabetic, has history of idiopathic neuropathy and was post hysterectomy with unilateral oophorectomy for endometriosis age 76.  Patient has history of cystitis 2 years ago which presented with suprapubic and back pain, resolved with macrobid x 3 months; CT then was not remarkable. She developed similar symptoms over several months recently and was seen by Dr Nila Nephew with finding of urinary retention. CT AP without contrast at [facility] 06-09-14 showed 9.5 x 14.8 x 10.5 cm complex cystic mass in central pelvis with uterus surgically absent, no adenopathy abdomen or pelvic sidewall, apparent thickening tail of pancreas and question of peripancreatic stranding, 2.5 x 1.8 cm low density structure adjacent to right atrium. She was seen by Dr Ellouise Newer on 06-11-14, with pelvic mass on exam, CA 125 152 and CEA 3.28 both on 06-11-14. She was seen by Dr Denman George on 06-16-14, at which time she  reported 10 lb weight loss in 2 weeks, early satiety, increased constipation and pain; her exam revealed large smooth pelvic mass filling pelvis, adherent to vagina and in close approximation to rectum, minimally mobile. CA 19-9 from 06-16-14 was 224.7. She had MRI abdomen at [facility] on 06-26-14 with findings of abnormal rounded/ truncated appearance of distal pancreatic body/tail with peripancreatic fluid/ inflammatory changes new compared with scans of 2013, associated splenic vein thrombosis, small volume ascites, no suspicious adenopathy, and probable benign pericardial cyst. She had exploratory laparotomy with right salpingo-oophorectomy, omentectomy and radical debulking of ovarian cancer by Dr Denman George at [facility] on 07-01-14. Intraoperative findings were of 20 cm cystic mass arising from right ovary, filling pelvis centrally and adherent to sigmoid colon, 200 cc ascites, peritoneal tumor plaques anterior pelvis and right diaphragm with TNTC 1 cm nodules small bowel mesentery and bowel wall; enlarged pancreatic body was described as rigid and abnormally firm to palpation. At completion of surgery there was residual tumor at the mesentery of the small and large intestine, thin tumor plaques on the diaphragm and pelvic peritoneum, and gallbladder serosa and pouch of Randol Kern representing an incomplete and suboptimal cytoreduction. 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 This pathology was reviewed by 3 pathologists in [facility] system. Patient was stable for DC home on 07-03-14. Within 24 hours after discharge she developed nausea, vomiting and abdominal pain, requiring readmission from 7-25 thru 07-15-14 for SBO and mild pancreatitis. She was managed conservatively in hospital, followed by Dr Denman George,  did require NG decompression and  support with TNA via PICC. Repeat CT AP was negative for high grade bowel obstruction or transition point; PICC was removed prior to DC home and diabetes was managed with SS insulin. Lovenox was continued in hospital but was not resumed at DC (initially planned x 4 weeks post op).  Patient has been gradually feeling stronger since most recent hospitalization, up and walking at home during days. She has aching discomfort epigastric area and low back requiring oxymorphone 10 mg bid and oxycodone generally twice daily. She is not drinking liquids optimally, discussed, and is tolerating small frequent meals with regular diet. Some nausea, no vomiting since day of DC,  using prn phenergan. Bowels are moving with stool softener 2-3 tablets twice daily, but last was 8-8 so will need to add laxative to this.   She and husband attended chemo education class prior to this visit. She does not have PAC.   REVIEW OF SYSTEMS as above, also: Occasional allergic HA unchanged. Good visual acuity with glasses. No difficutly hearing. No active dental problems, does have dentist. No known thyroid disease. No SOB, cough or other respiratory symotoms. Mammograms are overdue, usually done at PCP office (appointment with PCP on 07-28-14). Prilosec helps GERD symptoms. No bleeding Slight swelling right ankle post injury there, no other LE swelling. No cardiac symptoms. Has not checked BS since in hospital. Some difficulty sleeping with discomfort as above. No fever. Voiding adequately, continues Flomax tho not clear if she needs this now. Peripheral neuropathy has been stable x several years, with numbness right lateral and upper thigh, tingling left lower face, intermittent scattered other areas of "burning, tingling, numbness". Vaginal yeast improved but not resolved with diflucan x 1 dose in hospital. History of slight heart murmur.  Remainder of full 10 point review of systems negative.    ALLERGIES: Other; Penicillins; and Sulfa antibiotics  States [place] causes "paralysis" , sulfa causes rash, anaphylaxis to flu vaccine and confusion/ diplopia to gabapentin tried for neuropathy.  PAST MEDICAL/ SURGICAL HISTORY:  Gravida 3   HTN x 3-4 years -  Had nuclear stress test in past couple of years, apparently not remarkable. Osteoarthritis knees 2 left frontal meningiomas Left bundle branch block 1992 Rosacea Hx endometriosis s/p hysterectomy and left oophorectomy age 34 Diabetes: worsening Hgb A1c ~ 6 months ago with blood sugars up to 300s then Appendectomy Hernia repair Surgery on patellas bilaterally  BTL Colonoscopy between 5 and 10 yrs ago by Dr Lyndel Safe, unremarkable per pt Overdue mammogram by ~ several months, done at Dr Felipa Emory office Idiopathic neuropathy x several years, stable, evaluated by neurology in [place], tried multiple medications without improvement (reaction to gabapentin as above).   CURRENT MEDICATIONS: reviewed as listed now in EMR. Pain medications refilled. Diflucan 100 mg daily x 5-7 days now.  PHARMACY [facility] [place]   SOCIAL HISTORY: from [place] but grew up traveling with father in [place]. In this area x 11 years, lives with husband in [place]. 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 Husband has remote history of retroperitoneal seminoma treated by Dr Benay Spice, retired from Conservation officer, historic buildings. No tobacco or significant ETOH. Daughter Amy age 50, son Remo Lipps age 33, son Christia Reading age 70, 40 grands oldest 74 + husband with 2 grands.    FAMILY HISTORY:  Father lung ca Paternal uncle with renal ca (smoker) Aunt melanoma Maternal uncle lung ca Brother with MI age 76 Otherwise DM, HTN, cardiac disease  PHYSICAL EXAM:  height is 5' 2.5" (1.588 m) and weight is 150 lb 3.2 oz (68.13 kg). Her oral temperature is 97.6 F (36.4 C). Her blood pressure is 112/45 and her pulse is 108. Her  respiration is 20.  Alert, pleasant, cooperative lady, excellent historian, looks fatigued and mildly uncomfortable but NAD. Husband very supportive. Ambulatory without assistance, able to get on and off exam table, respirations not labored RA.  HEENT: PERRL, not icteric. Oral mucosa slightly dry, clear, posterior pharynx likewise. Neck supple without JVD or thyroid mass. Normal hair pattern.  RESPIRATORY: lungs clear to A and P. No cough, no use of accessory muscles  CARDIAC/ VASCULAR: heart RRR, no murmur appreciated, no gallop. Peripheral pulses intact and symmetrical  ABDOMEN: soft, not distended, active normal bowel sounds. Steristrips on midline incision to umbilicus, no erythema or unusual tenderness at incision. Minimally tender in epigastric area. No appreciable HSM.  LYMPH NODES: No cervical, supraclavicular, axillary or inguinal adenopathy  BREASTS: bilaterally without dominant mass, skin or nipple findings.  NEUROLOGIC: Numbness left lower face without facial droop, right lateral thigh. Otherwise CN, motor, sensory, cerebellar nonfocal. PSYCH appropriate mood and affect.  SKIN: Without rash, ecchymosis, petechiae. MUSCULOSKELETAL: back not tender. LE no edema, cords, tenderness. Normal, symmetrical muscle mass    LABORATORY DATA:  Results for orders placed in visit on 07/21/14 (from the past 48 hour(s))  CBC WITH DIFFERENTIAL     Status: Abnormal   Collection Time    07/21/14  3:03 PM      Result Value Ref Range   WBC 6.1  3.9 - 10.3 10e3/uL   NEUT# 4.1  1.5 - 6.5 10e3/uL   HGB 11.2 (*) 11.6 - 15.9 g/dL   HCT 34.5 (*) 34.8 - 46.6 %   Platelets 200  145 - 400 10e3/uL   MCV 87.2  79.5 - 101.0 fL   MCH 28.2  25.1 - 34.0 pg   MCHC 32.3  31.5 - 36.0 g/dL   RBC 3.95  3.70 - 5.45 10e6/uL   RDW 14.1  11.2 - 14.5 %   lymph# 1.3  0.9 - 3.3 10e3/uL   MONO# 0.4  0.1 - 0.9 10e3/uL   Eosinophils Absolute 0.2  0.0 - 0.5 10e3/uL   Basophils Absolute 0.1  0.0 - 0.1 10e3/uL    NEUT% 66.3  38.4 - 76.8 %   LYMPH% 21.4  14.0 - 49.7 %   MONO% 7.1  0.0 - 14.0 %   EOS% 3.3  0.0 - 7.0 %   BASO% 1.9  0.0 - 2.0 %  COMPREHENSIVE METABOLIC PANEL     Status: Abnormal   Collection Time    07/21/14  3:04 PM      Result Value Ref Range   Sodium 136  135 - 145 mEq/L   Potassium 4.5  3.5 - 5.3 mEq/L   Chloride 95 (*) 96 - 112 mEq/L   CO2 28  19 - 32 mEq/L   Glucose, Bld 200 (*) 70 - 99 mg/dL   BUN 15  6 - 23 mg/dL   Creatinine, Ser 0.58  0.50 - 1.10 mg/dL   Total Bilirubin 0.5  0.2 - 1.2 mg/dL   Alkaline Phosphatase 139 (*) 39 - 117 U/L   AST 14  0 - 37 U/L   ALT 15  0 - 35 U/L   Total Protein 6.8  6.0 - 8.3 g/dL   Albumin 3.2 (*) 3.5 - 5.2 g/dL   Calcium 9.7  8.4 -  10.5 mg/dL     RADIOGRAPHY:   CT AP 06-09-14 without contrast done at [facility] by Dr Joie Bimler as noted above, report to be scanned into this EMR.  MRI ABDOMEN WITHOUT AND WITH CONTRAST 06-17-14 TECHNIQUE:  Multiplanar multisequence MR imaging of the abdomen was performed  both before and after the administration of intravenous contrast.  CONTRAST: 78mL MULTIHANCE GADOBENATE DIMEGLUMINE 529 MG/ML IV SOLN  COMPARISON: Unenhanced CT abdomen pelvis dated 06/09/2014. CT  abdomen pelvis dated 03/23/2012.  FINDINGS:  Motion degraded images.  2.6 x 2.0 cm fluid density structure adjacent to the right heart,  compatible with a benign pericardial cyst or foregut duplication  cyst.  Liver is notable for moderate to severe hepatic steatosis. No  suspicious/enhancing hepatic lesions.  Abnormal rounded/truncated appearance of the distal pancreatic  body/tail (series 8/image 28). Surrounding peripancreatic  fluid/inflammatory changes (series 3/image 24). This appearance  suggests acute/chronic pancreatitis, and specifically raises the  possibility of igG-mediated autoimmune pancreatitis. Underlying  pancreatic mass/neoplasm is not excluded. Notably, this  appearance/configuration of the pancreas is  new when compared to  2013.  Associated splenic vein thrombosis.  Spleen and adrenal glands are within normal limits.  Trace perihepatic and small volume perisplenic ascites.  Gallbladder is unremarkable. No intrahepatic or extrahepatic ductal  dilatation.  Kidneys are within normal limits. No hydronephrosis.  No suspicious abdominal lymphadenopathy.  No focal osseous lesions.  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  Associated splenic vein thrombosis and small volume upper abdominal  ascites.  This appearance is new from 2013. Underlying pancreatic  mass/neoplasm is not excluded.  Correlate with serum laboratory evaluation and consider EUS with  biopsy.    CHEST 2 VIEW  06-25-14 COMPARISON: PA and lateral chest 05/16/2008.  FINDINGS:  The lungs are clear. Heart size is normal. No pneumothorax or  pleural effusion. No focal bony abnormality.  IMPRESSION:  No acute disease.    CT ABDOMEN AND PELVIS WITH CONTRAST 07-05-14  COMPARISON: 06/09/2014.  FINDINGS:  Lung Bases: Bibasilar dependent atelectasis noted. Stable appearance  of cystic lesion adjacent to the right heart, suggesting pericardial  cyst.  Liver: No focal abnormality in the liver.  Spleen: No focal mass lesion. No dilatation of the main duct. No  intraparenchymal cyst. No peripancreatic edema.  Stomach: Tiny hiatal hernia. Stomach is markedly distended.  Pancreas: Knee body and tail of the pancreas remains irregularly  thickened, better characterized on the recent MRI.  Gallbladder/Biliary: Gallbladder lumen is apparently sludge filled.  No intra or extrahepatic biliary duct dilatation.  Kidneys/Adrenals: No adrenal nodule. No enhancing lesion in either  kidney.  Bowel Loops: Proximal small bowel is dilated up to 4.3 cm in   diameter. Although a discrete transition stone cannot be identified  in the small bowel, the distal small bowel is decompressed. Colon is  nondilated.  Nodes: No lymphadenopathy in the abdomen or pelvis.  Vasculature: No abdominal aortic aneurysm.  Pelvic Genitourinary: Gas is identified within the bladder,  presumably secondary to recent instrumentation. Uterus is surgically  absent. The large cystic pelvic mass seen previously is no longer  evident.  Bones/Musculoskeletal: Bone windows reveal no worrisome lytic or  sclerotic osseous lesions.  Body Wall: There is edema within the body wall. A trace amount of  gas is seen in the subcutaneous fat of the right paramidline  anterior abdominal wall.  Other:  There is trace intraperitoneal free air, not unexpected on  postoperative day 4. Mesenteric edema and trace interloop mesenteric  fluid is seen in the left upper quadrant jejunal mesenteric. There  is a small amount of free fluid adjacent to the liver and spleen.  Free fluid is seen in the anterior peritoneal cavity of the lower  left abdomen. There is a trace amount of fluid in the cul-de-sac.  IMPRESSION:  Prominent fluid-filled dilated proximal small bowel. The distal  small bowel is decompressed. A discrete transition zone cannot be  identified. Given the lack of distal small bowel distention, a  component of proximal to mid small bowel obstruction is favored over  adynamic ileus.  Intraperitoneal free fluid, more so on the left than the right.  Interval or section of the complex cyst in the pelvis.  Intraperitoneal free air is identified, but this is not unexpected 4  days from surgery.    CT ABDOMEN AND PELVIS WITH CONTRAST (ENTEROGRAPHY)  07-11-14  COMPARISON: Radiographs 07/10/2014, MRI 06/26/2014 and CT  07/05/2014.  FINDINGS:  Lung bases: There are enlarging left-greater-than-right pleural  effusions with associated mild worsening of adjacent bibasilar  atelectasis.  3.3 cm fluid collection along the posterior right heart  border has slightly enlarged and is new from remote priors, probably  loculated pleural fluid.  Liver/Biliary/Pancreas: The liver appears unchanged with focal fat  adjacent to the falciform ligament. There is no suspicious liver  lesion. High-density bile within the gallbladder lumen appears  stable. No calcified gallstones or gallbladder wall thickening  identified. There is no biliary dilatation. There is persistent  ill-defined enlargement of the pancreatic body and tail with soft  tissue density extending posteriorly into the retroperitoneum. The  splenic vein is occluded. There are multiple splenic and gastric  varices. The portal and superior mesenteric veins are patent.  Spleen/Adrenal glands: There is stable chronic nodularity of the  left adrenal gland. The right adrenal gland and spleen appear  unremarkable.  Kidneys/Ureters/Bladder: Both kidneys appear normal without focal  lesion. There is interval development of mild  right-greater-than-left hydronephrosis without significantly delayed  contrast excretion. No obstructing ureteral calculus or adjacent  retroperitoneal mass identified. The bladder appears stable with  focal wall thickening along the right aspect of the bladder dome.  Bowel/Peritoneum: Nasogastric tube is in place. The stomach is  decompressed. The proximal small bowel is normal in caliber. There  is mild distention of the mid small bowel, not unexpected for an  enterography study. The distal small bowel and colon are  decompressed. No focal transition point identified. Ascites has not  significantly increased in volume. There is some omental nodularity  anteriorly, most notable on axial images 44-47. This is not clearly  seen on the most recent study and could reflect a thrombosed  varicosity.  Retroperitoneum/Pelvis: There are no enlarged abdominal or pelvic  lymph nodes. No other significant vascular  findings are  demonstrated. There is no evidence of recurrent pelvic mass.  Abdominal wall: There are stable postsurgical changes within the  anterior abdominal wall. Skin staples have been removed. There is no  residual pneumoperitoneum.  Musculoskeletal: No acute or significant osseus findings.  IMPRESSION:  1. CT enterography study demonstrates nonspecific mid small bowel  distension without focal transition point to suggest high-grade  bowel obstruction.  2. Similar volume of ascites with resolution of postsurgical  pneumoperitoneum. Suggested nodularity anteriorly status post  omentectomy was not clearly seen on the most recent study and may  reflect a  thrombosed varicosity.  3. 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 This could be the sequela of pancreatitis as suggested on  MRI. A mass involving the retropancreatic retroperitoneum cannot be  excluded.  4. Enlarging bilateral pleural effusions with increased bibasilar  atelectasis.  5. New mild bilateral hydronephrosis, possibly related to bladder  distention. No high-grade ureteral obstruction. There is some  bladder wall thickening along the right aspect of the bladder dome.     PATHOLOGY Romberg, Marry Collected: 07/01/2014 Client: [facility] Accession: [id number] Received: 07/01/2014 Everitt Amber, MDEPORT OF SURGICAL PATHOLOGY FINAL DIAGNOSIS Diagnosis 1. Adnexa - ovary +/- tube, neoplastic, right - MUCINOUS CYSTADENOCARCINOMA. - TUMOR PRESENT ON SURFACE OF OVARY. - SEE ONCOLOGY TEMPLATE. 2. Omentum, resection for tumor - POSITIVE FOR ADENOCARCINOMA. - SEE COMMENT. 3. Peritoneum, biopsy, anterior pelvic - POSITIVE FOR ADENOCARCINOMA. - SEE COMMENT. Microscopic Comment 1. OVARY Specimen(s): Right ovary with fallopian tube; omentum resection and anterior pelvic peritoneum biopsy. Procedure: (including lymph node sampling) Exploratory laparotomy with right  salpingo-oophorectomy, omentectomy and radical debulking for ovarian cancer. Primary tumor site (including laterality): Right ovary. Ovarian surface involvement: Yes Ovarian capsule intact without fragmentation: Yes. Maximum tumor size (cm): 16 cm. Histologic type: Mucinous adenocarcinoma. Grade: G1-G2: Well to moderately differentiated. Peritoneal implants: (specify invasive or non-invasive): Yes, invasive adenocarcinoma is present in the peritoneum. Pelvic extension (list additional structures on separate lines and if involved): Pelvic peritoneum is involved; no other structures are present from the pelvis for evaluation. Lymph nodes: No lymph nodes examined. TNM code: At least pT3a, pNX, see comments. FIGO Stage: At least FIGO stage IIIA, needs clinical correlation, see comments. Comments: Based on the pathologic findings in the omentum specimen, only microscopic tumor is identified with no macroscopic tumor nodules identified which is compatible with stage pT3a. However, based on the operative report the final tumor stage is likely higher (if there are macroscopic tumor nodules less than 2 cm, this corresponds to a pT3b / FIGO stage IIIB; if macroscopic tumor nodules are present measuring greater than 2 cm, this would correspond to a pT3c / stage FIGO grade IIIC). Please correlate with operative impression. The ovarian adenocarcinoma demonstrates very well differentiated (more mucinous areas) as well as more poorly differentiated areas. These findings along with a unilateral large ovarian mass fit best with a primary mucinous ovarian adenocarcinoma. Benign fallopian tube is also present within the specimen. Both Dr. Lyndon Code and Dr. Donato Heinz have seen the tumor in consultation with agreement that the findings represent a primary ovarian tumor. Specimens #2 and 3: Tumor in both the omentum and peritoneum is identical to the tumor in the ovary. Dr. Donato Heinz has seen the second and third specimens in  consultation with agreement that they both are positive for adenocarcinoma.   DISCUSSION:  I have discussed lovenox with Dr Denman George: as patient is out 3 weeks from surgery and up walking well at home, will not resume lovenox now.  We have discussed circumstances surrounding diagnosis, surgical findings and pathology information, questions re pancreas findings and consideration of GI evaluation possibly with EUS, and post operative complications. I have mentioned that GI cancers can be mucinous adenocarcinomas and at times do metastasize to ovary, tho pathologists do feel that findings are consistent with primary ovarian malignancy. As choice of chemotherapy regimen might be adjusted if GI malignancy is documented, and as gastroenterology assistance might be helpful with recent pancreatitis symptoms as she goes thru treatment, she is in agreement with GI evaluation. I was  unable to speak with Dr Oretha Caprice at time of patient's visit, but will try to contact him directly to discuss. Hopefully evaluation can be expedited as I would like to begin chemotherapy in next 1-2 weeks if possible. I did talk with her about carboplatin with weekly taxol, as this may be better tolerated including with her neuropathy, at least initially (if no other regimen more appropriate pending GI evaluation). She is aware of possible taxol side effects of peripheral neuropathy. DIscussed diet. Reviewed CBC and CMET from today at time of visit. She prefers no PAC if peripheral veins adequate, tho understands that she may need PAC even if start with peripheral access. Patient and husband understand that we will let them know appointments after I speak with GI. All questions were answered and they are in agreement with chemotherapy as above and plan as above  She will need other antiemetics and steroid premeds for taxol, not sent to pharmacy today.     IMPRESSION / PLAN:   1.mucinous cystadenocarcinoma of right ovary: post  suboptimal debulking 07-01-14, recommendation for carboplatin + taxane chemotherapy 2.abnormal pancreatic body and tail by imaging, and by intraoperative evaluation, with some pancreatitis postoperatively and elevated CA 19-9. Will ask GI to consider EUS. Pain seems most consistent with pancreatic at present, continuing narcotics 3.SBO postoperatively: resolved with conservative care. Add senna or miralax to stool softeners now. 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  5. Diabetes: on oral agent prior to this diagnosis, with worsening of Hb A1c and higher blood sugars in past few months. Will need to follow blood sugars at home at least when chemo begins. 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 They can contact this office for questions or concerns at any time prior to next visit.  Time spent  >75 min , including >50% discussion and coordination of care.  Consent obtained for carbo taxol if that is eventual choice. Chemo orders not done yet pending above. Antiemetics as above. Prior auth not requested until chemo orders completed.   Jeri Rawlins P, MD 07/21/2014 9:04 PM

## 2014-07-22 ENCOUNTER — Other Ambulatory Visit: Payer: Self-pay | Admitting: Oncology

## 2014-07-22 ENCOUNTER — Telehealth: Payer: Self-pay | Admitting: Oncology

## 2014-07-22 DIAGNOSIS — R932 Abnormal findings on diagnostic imaging of liver and biliary tract: Secondary | ICD-10-CM

## 2014-07-22 LAB — IRON AND TIBC
%SAT: 12 % — ABNORMAL LOW (ref 20–55)
Iron: 32 ug/dL — ABNORMAL LOW (ref 42–145)
TIBC: 261 ug/dL (ref 250–470)
UIBC: 229 ug/dL (ref 125–400)

## 2014-07-22 LAB — FERRITIN: Ferritin: 93 ng/mL (ref 10–291)

## 2014-07-22 NOTE — Telephone Encounter (Signed)
Medical Oncology  Spoke directly with patient now re my discussions with GI, including Dr Lyndel Safe, and referral to Dr Arta Silence for probable EUS. She has had more nausea and a couple of episodes of vomiting today, has not tried anitemetics. I told her to do clear liquids only (can include Boost Breeze or Resource) and to try ativan SL as she has this at home. Mentioned ODT zofran or phenergan suppositories if needed. She knows to call if vomiting or other symptoms worse, and otherwise will call my RN on 8-12 to let us know how she is and to let us know schedule with Dr Erlinda Hong office.  Patient in agreement with plans.  L.Livesay,MD

## 2014-07-22 NOTE — Telephone Encounter (Signed)
Medical Oncology  Communicated with Dr Ardis Hughs, who does not have availability in next 2 weeks, but agrees with recommendation for EUS. This MD then spoke directly with Dr R.Lyndel Safe in [place] (office [phone], cell [phone]), with information that EUS is not done by [facility] or other at [facility] now, tho he could assist with referral to [facility] if needed. He agrees that further evaluation of pancreas seems appropriate. He also told me that last colonoscopy with polyps was 10 years ago, as patient did not follow up with repeat as recommended, at least with him.  I then spoke directly with Dr Arta Silence, who has kindly agreed to see patient and likely can do EUS next week. Referral placed via EMR now.  Godfrey Pick, MD

## 2014-07-23 ENCOUNTER — Telehealth: Payer: Self-pay

## 2014-07-23 NOTE — Telephone Encounter (Signed)
Ms. Messenger stated that she started using the Ativan 0.5 mg sl today and kept food down.  Told her that Dr. Marko Plume said that she could take the ativan every 4-6 hours as needed. She is taking in fluids fairly well.   She had a good BM yesterday after using a glycerin suppository. Pain level right now is 0. She is to see Dr. Paulita Fujita today at 1615. Dr. Marko Plume notified of this appointment.

## 2014-07-23 NOTE — Telephone Encounter (Signed)
Message copied by Baruch Merl on Wed Jul 23, 2014  4:32 PM ------      Message from: Gordy Levan      Created: Tue Jul 22, 2014  2:59 PM       She is to call RN 8-12 re vomiting/ po intake and to let us know if Dr Arnetha Massy office has called re apts.      Note SBO and pancreatitis post gyn onc surgery.            thanks ------

## 2014-07-25 ENCOUNTER — Encounter (HOSPITAL_COMMUNITY): Payer: Self-pay | Admitting: Pharmacy Technician

## 2014-07-25 ENCOUNTER — Encounter (HOSPITAL_COMMUNITY): Payer: Self-pay | Admitting: *Deleted

## 2014-07-25 ENCOUNTER — Other Ambulatory Visit: Payer: Self-pay | Admitting: Oncology

## 2014-07-26 ENCOUNTER — Telehealth: Payer: Self-pay | Admitting: Oncology

## 2014-07-26 NOTE — Telephone Encounter (Signed)
s/w pt re appt for 8/26.

## 2014-07-28 ENCOUNTER — Other Ambulatory Visit: Payer: Self-pay | Admitting: Gastroenterology

## 2014-07-28 NOTE — Addendum Note (Signed)
Addended by: Linkoln Alkire on: 07/28/2014 04:48 PM   Modules accepted: Orders  

## 2014-07-30 ENCOUNTER — Encounter (HOSPITAL_COMMUNITY): Payer: Self-pay | Admitting: *Deleted

## 2014-07-30 ENCOUNTER — Encounter (HOSPITAL_COMMUNITY): Payer: Managed Care, Other (non HMO) | Admitting: Anesthesiology

## 2014-07-30 ENCOUNTER — Ambulatory Visit (HOSPITAL_COMMUNITY)
Admission: RE | Admit: 2014-07-30 | Discharge: 2014-07-30 | Disposition: A | Discharge status: Home / Self Care | Payer: Managed Care, Other (non HMO) | Source: Ambulatory Visit | Attending: Gastroenterology | Admitting: Gastroenterology

## 2014-07-30 ENCOUNTER — Encounter (HOSPITAL_COMMUNITY)
Admission: RE | Disposition: A | Discharge status: Home / Self Care | Payer: Self-pay | Source: Ambulatory Visit | Attending: Gastroenterology

## 2014-07-30 ENCOUNTER — Telehealth: Payer: Self-pay

## 2014-07-30 ENCOUNTER — Ambulatory Visit (HOSPITAL_COMMUNITY): Payer: Managed Care, Other (non HMO) | Admitting: Anesthesiology

## 2014-07-30 DIAGNOSIS — R6881 Early satiety: Secondary | ICD-10-CM | POA: Insufficient documentation

## 2014-07-30 DIAGNOSIS — K8689 Other specified diseases of pancreas: Secondary | ICD-10-CM | POA: Diagnosis not present

## 2014-07-30 DIAGNOSIS — R112 Nausea with vomiting, unspecified: Secondary | ICD-10-CM | POA: Insufficient documentation

## 2014-07-30 DIAGNOSIS — R188 Other ascites: Secondary | ICD-10-CM | POA: Diagnosis not present

## 2014-07-30 DIAGNOSIS — C569 Malignant neoplasm of unspecified ovary: Secondary | ICD-10-CM | POA: Diagnosis not present

## 2014-07-30 DIAGNOSIS — C561 Malignant neoplasm of right ovary: Secondary | ICD-10-CM

## 2014-07-30 HISTORY — PX: ESOPHAGOGASTRODUODENOSCOPY (EGD) WITH PROPOFOL: SHX5813

## 2014-07-30 HISTORY — PX: EUS: SHX5427

## 2014-07-30 LAB — GLUCOSE, CAPILLARY: Glucose-Capillary: 147 mg/dL — ABNORMAL HIGH (ref 70–99)

## 2014-07-30 SURGERY — ESOPHAGOGASTRODUODENOSCOPY (EGD) WITH PROPOFOL
Anesthesia: Monitor Anesthesia Care

## 2014-07-30 MED ORDER — PROPOFOL INFUSION 10 MG/ML OPTIME
INTRAVENOUS | Status: DC | PRN
  Administered 2014-07-30: 300 ug/kg/min via INTRAVENOUS

## 2014-07-30 MED ORDER — CIPROFLOXACIN IN D5W 400 MG/200ML IV SOLN
INTRAVENOUS | Status: DC | PRN
  Administered 2014-07-30: 400 mg via INTRAVENOUS

## 2014-07-30 MED ORDER — LORAZEPAM 0.5 MG PO TABS: ORAL_TABLET | ORAL | Status: DC

## 2014-07-30 MED ORDER — CIPROFLOXACIN IN D5W 400 MG/200ML IV SOLN
INTRAVENOUS | Status: AC
  Filled 2014-07-30: qty 200

## 2014-07-30 MED ORDER — FENTANYL CITRATE 0.05 MG/ML IJ SOLN
INTRAMUSCULAR | Status: AC
  Filled 2014-07-30: qty 2

## 2014-07-30 MED ORDER — PROPOFOL 10 MG/ML IV BOLUS
INTRAVENOUS | Status: DC | PRN
  Administered 2014-07-30: 20 mg via INTRAVENOUS

## 2014-07-30 MED ORDER — CIPROFLOXACIN HCL 500 MG PO TABS: 500.0000 mg | ORAL_TABLET | Freq: Two times a day (BID) | ORAL | Status: AC

## 2014-07-30 MED ORDER — PROPOFOL 10 MG/ML IV BOLUS
INTRAVENOUS | Status: AC
  Filled 2014-07-30: qty 40

## 2014-07-30 MED ORDER — SODIUM CHLORIDE 0.9 % IV SOLN: INTRAVENOUS | Status: DC

## 2014-07-30 MED ORDER — GLYCOPYRROLATE 0.2 MG/ML IJ SOLN
INTRAMUSCULAR | Status: AC
  Filled 2014-07-30: qty 1

## 2014-07-30 MED ORDER — FENTANYL CITRATE 0.05 MG/ML IJ SOLN
INTRAMUSCULAR | Status: DC | PRN
  Administered 2014-07-30: 100 ug via INTRAVENOUS

## 2014-07-30 MED ORDER — PROPOFOL 10 MG/ML IV BOLUS
INTRAVENOUS | Status: AC
  Filled 2014-07-30: qty 20

## 2014-07-30 MED ORDER — LACTATED RINGERS IV SOLN
INTRAVENOUS | Status: DC
  Administered 2014-07-30: 11:00:00 via INTRAVENOUS
  Administered 2014-07-30: 1000 mL via INTRAVENOUS

## 2014-07-30 SURGICAL SUPPLY — 14 items

## 2014-07-30 NOTE — Transfer of Care (Signed)
Immediate Anesthesia Transfer of Care Note  Patient: Kristin Meadows  Procedure(s) Performed: Procedure(s): ESOPHAGOGASTRODUODENOSCOPY (EGD) WITH PROPOFOL (N/A) ESOPHAGEAL ENDOSCOPIC ULTRASOUND (EUS) RADIAL (N/A)  Patient Location: [facility]  Anesthesia Type:MAC  Level of Consciousness: Patient easily awoken, sedated, comfortable, cooperative, following commands, responds to stimulation.   Airway & Oxygen Therapy: Patient spontaneously breathing, ventilating well, oxygen via simple oxygen mask.  Post-op Assessment: Report given to PACU RN, vital signs reviewed and stable, moving all extremities.   Post vital signs: Reviewed and stable.  Complications: No apparent anesthesia complications

## 2014-07-30 NOTE — Discharge Instructions (Signed)
Gastrointestinal Endoscopy, Care After °Refer to this sheet in the next few weeks. These instructions provide you with information on caring for yourself after your procedure. Your caregiver may also give you more specific instructions. Your treatment has been planned according to current medical practices, but problems sometimes occur. Call your caregiver if you have any problems or questions after your procedure. °HOME CARE INSTRUCTIONS °· If you were given medicine to help you relax (sedative), do not drive, operate machinery, or sign important documents for 24 hours. °· Avoid alcohol and hot or warm beverages for the first 24 hours after the procedure. °· Only take over-the-counter or prescription medicines for pain, discomfort, or fever as directed by your caregiver. You may resume taking your normal medicines unless your caregiver tells you otherwise. Ask your caregiver when you may resume taking medicines that may cause bleeding, such as aspirin, clopidogrel, or warfarin. °· You may return to your normal diet and activities on the day after your procedure, or as directed by your caregiver. Walking may help to reduce any bloated feeling in your abdomen. °· Drink enough fluids to keep your urine clear or pale yellow. °· You may gargle with salt water if you have a sore throat. °SEEK IMMEDIATE MEDICAL CARE IF: °· You have severe nausea or vomiting. °· You have severe abdominal pain, abdominal cramps that last longer than 6 hours, or abdominal swelling (distention). °· You have severe shoulder or back pain. °· You have trouble swallowing. °· You have shortness of breath, your breathing is shallow, or you are breathing faster than normal. °· You have a fever or a rapid heartbeat. °· You vomit blood or material that looks like coffee grounds. °· You have bloody, black, or tarry stools. °MAKE SURE YOU: °· Understand these instructions. °· Will watch your condition. °· Will get help right away if you are not doing  well or get worse. °Document Released: 07/12/2004 Document Revised: 04/14/2014 Document Reviewed: 02/28/2012 °[employer]® Patient Information ©2015 [employer]. This information is not intended to replace advice given to you by your health care provider. Make sure you discuss any questions you have with your health care provider. ° °

## 2014-07-30 NOTE — H&P (View-Only) (Signed)
[facility] NEW PATIENT EVALUATION   Name: Kristin Meadows Date: 07/21/2014 MRN: [id number] DOB: [birth year]  REFERRING PHYSICIAN: Everitt Amber Other physicians: Ernestene Kiel, MD (PCP [facility], [place]); Ellouise Newer (gyn [facility] 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 History is from outside records from PCP/gyn/scans which I have reviewed in their entirety, this EMR, discussion directly with Dr Denman George  and from patient/ husband now.   HISTORY OF PRESENT ILLNESS:Kristin Meadows is a 58 y.o. 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 Situation is complicated by imaging and intraoperative findings of abnormal pancreas, and by bowel obstruction in post operative period. She is diabetic, has history of idiopathic neuropathy and was post hysterectomy with unilateral oophorectomy for endometriosis age 44.  Patient has history of cystitis 2 years ago which presented with suprapubic and back pain, resolved with macrobid x 3 months; CT then was not remarkable. She developed similar symptoms over several months recently and was seen by Dr Nila Nephew with finding of urinary retention. CT AP without contrast at [facility] 06-09-14 showed 9.5 x 14.8 x 10.5 cm complex cystic mass in central pelvis with uterus surgically absent, no adenopathy abdomen or pelvic sidewall, apparent thickening tail of pancreas and question of peripancreatic stranding, 2.5 x 1.8 cm low density structure adjacent to right atrium. She was seen by Dr Ellouise Newer on 06-11-14, with pelvic mass on exam, CA 125 152 and CEA 3.28 both on 06-11-14. She was seen by Dr Denman George on 06-16-14, at which time she  reported 10 lb weight loss in 2 weeks, early satiety, increased constipation and pain; her exam revealed large smooth pelvic mass filling pelvis, adherent to vagina and in close approximation to rectum, minimally mobile. CA 19-9 from 06-16-14 was 224.7. She had MRI abdomen at [facility] on 06-26-14 with findings of abnormal rounded/ truncated appearance of distal pancreatic body/tail with peripancreatic fluid/ inflammatory changes new compared with scans of 2013, associated splenic vein thrombosis, small volume ascites, no suspicious adenopathy, and probable benign pericardial cyst. She had exploratory laparotomy with right salpingo-oophorectomy, omentectomy and radical debulking of ovarian cancer by Dr Denman George at [facility] on 07-01-14. Intraoperative findings were of 20 cm cystic mass arising from right ovary, filling pelvis centrally and adherent to sigmoid colon, 200 cc ascites, peritoneal tumor plaques anterior pelvis and right diaphragm with TNTC 1 cm nodules small bowel mesentery and bowel wall; enlarged pancreatic body was described as rigid and abnormally firm to palpation. At completion of surgery there was residual tumor at the mesentery of the small and large intestine, thin tumor plaques on the diaphragm and pelvic peritoneum, and gallbladder serosa and pouch of Randol Kern representing an incomplete and suboptimal cytoreduction. 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 This pathology was reviewed by 3 pathologists in [facility] system. Patient was stable for DC home on 07-03-14. Within 24 hours after discharge she developed nausea, vomiting and abdominal pain, requiring readmission from 7-25 thru 07-15-14 for SBO and mild pancreatitis. She was managed conservatively in hospital, followed by Dr Denman George,  did require NG decompression and  support with TNA via PICC. Repeat CT AP was negative for high grade bowel obstruction or transition point; PICC was removed prior to DC home and diabetes was managed with SS insulin. Lovenox was continued in hospital but was not resumed at DC (initially planned x 4 weeks post op).  Patient has been gradually feeling stronger since most recent hospitalization, up and walking at home during days. She has aching discomfort epigastric area and low back requiring oxymorphone 10 mg bid and oxycodone generally twice daily. She is not drinking liquids optimally, discussed, and is tolerating small frequent meals with regular diet. Some nausea, no vomiting since day of DC,  using prn phenergan. Bowels are moving with stool softener 2-3 tablets twice daily, but last was 8-8 so will need to add laxative to this.   She and husband attended chemo education class prior to this visit. She does not have PAC.   REVIEW OF SYSTEMS as above, also: Occasional allergic HA unchanged. Good visual acuity with glasses. No difficutly hearing. No active dental problems, does have dentist. No known thyroid disease. No SOB, cough or other respiratory symotoms. Mammograms are overdue, usually done at PCP office (appointment with PCP on 07-28-14). Prilosec helps GERD symptoms. No bleeding Slight swelling right ankle post injury there, no other LE swelling. No cardiac symptoms. Has not checked BS since in hospital. Some difficulty sleeping with discomfort as above. No fever. Voiding adequately, continues Flomax tho not clear if she needs this now. Peripheral neuropathy has been stable x several years, with numbness right lateral and upper thigh, tingling left lower face, intermittent scattered other areas of "burning, tingling, numbness". Vaginal yeast improved but not resolved with diflucan x 1 dose in hospital. History of slight heart murmur.  Remainder of full 10 point review of systems negative.    ALLERGIES: Other; Penicillins; and Sulfa antibiotics  States [place] causes "paralysis" , sulfa causes rash, anaphylaxis to flu vaccine and confusion/ diplopia to gabapentin tried for neuropathy.  PAST MEDICAL/ SURGICAL HISTORY:  Gravida 3   HTN x 3-4 years -  Had nuclear stress test in past couple of years, apparently not remarkable. Osteoarthritis knees 2 left frontal meningiomas Left bundle branch block 1992 Rosacea Hx endometriosis s/p hysterectomy and left oophorectomy age 58 Diabetes: worsening Hgb A1c ~ 6 months ago with blood sugars up to 300s then Appendectomy Hernia repair Surgery on patellas bilaterally  BTL Colonoscopy between 5 and 10 yrs ago by Dr Lyndel Safe, unremarkable per pt Overdue mammogram by ~ several months, done at Dr Felipa Emory office Idiopathic neuropathy x several years, stable, evaluated by neurology in [place], tried multiple medications without improvement (reaction to gabapentin as above).   CURRENT MEDICATIONS: reviewed as listed now in EMR. Pain medications refilled. Diflucan 100 mg daily x 5-7 days now.  PHARMACY [facility] [place]   SOCIAL HISTORY: from [place] but grew up traveling with father in [place]. In this area x 11 years, lives with husband in [place]. 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 Husband has remote history of retroperitoneal seminoma treated by Dr Benay Spice, retired from Conservation officer, historic buildings. No tobacco or significant ETOH. Daughter Kristin Meadows age 58, son Kristin Meadows age 46, son Kristin Meadows age 55, 65 grands oldest 70 + husband with 2 grands.    FAMILY HISTORY:  Father lung ca Paternal uncle with renal ca (smoker) Aunt melanoma Maternal uncle lung ca Brother with MI age 68 Otherwise DM, HTN, cardiac disease  PHYSICAL EXAM:  height is 5' 2.5" (1.588 m) and weight is 150 lb 3.2 oz (68.13 kg). Her oral temperature is 97.6 F (36.4 C). Her blood pressure is 112/45 and her pulse is 108. Her  respiration is 20.  Alert, pleasant, cooperative lady, excellent historian, looks fatigued and mildly uncomfortable but NAD. Husband very supportive. Ambulatory without assistance, able to get on and off exam table, respirations not labored RA.  HEENT: PERRL, not icteric. Oral mucosa slightly dry, clear, posterior pharynx likewise. Neck supple without JVD or thyroid mass. Normal hair pattern.  RESPIRATORY: lungs clear to A and P. No cough, no use of accessory muscles  CARDIAC/ VASCULAR: heart RRR, no murmur appreciated, no gallop. Peripheral pulses intact and symmetrical  ABDOMEN: soft, not distended, active normal bowel sounds. Steristrips on midline incision to umbilicus, no erythema or unusual tenderness at incision. Minimally tender in epigastric area. No appreciable HSM.  LYMPH NODES: No cervical, supraclavicular, axillary or inguinal adenopathy  BREASTS: bilaterally without dominant mass, skin or nipple findings.  NEUROLOGIC: Numbness left lower face without facial droop, right lateral thigh. Otherwise CN, motor, sensory, cerebellar nonfocal. PSYCH appropriate mood and affect.  SKIN: Without rash, ecchymosis, petechiae. MUSCULOSKELETAL: back not tender. LE no edema, cords, tenderness. Normal, symmetrical muscle mass    LABORATORY DATA:  Results for orders placed in visit on 07/21/14 (from the past 48 hour(s))  CBC WITH DIFFERENTIAL     Status: Abnormal   Collection Time    07/21/14  3:03 PM      Result Value Ref Range   WBC 6.1  3.9 - 10.3 10e3/uL   NEUT# 4.1  1.5 - 6.5 10e3/uL   HGB 11.2 (*) 11.6 - 15.9 g/dL   HCT 34.5 (*) 34.8 - 46.6 %   Platelets 200  145 - 400 10e3/uL   MCV 87.2  79.5 - 101.0 fL   MCH 28.2  25.1 - 34.0 pg   MCHC 32.3  31.5 - 36.0 g/dL   RBC 3.95  3.70 - 5.45 10e6/uL   RDW 14.1  11.2 - 14.5 %   lymph# 1.3  0.9 - 3.3 10e3/uL   MONO# 0.4  0.1 - 0.9 10e3/uL   Eosinophils Absolute 0.2  0.0 - 0.5 10e3/uL   Basophils Absolute 0.1  0.0 - 0.1 10e3/uL    NEUT% 66.3  38.4 - 76.8 %   LYMPH% 21.4  14.0 - 49.7 %   MONO% 7.1  0.0 - 14.0 %   EOS% 3.3  0.0 - 7.0 %   BASO% 1.9  0.0 - 2.0 %  COMPREHENSIVE METABOLIC PANEL     Status: Abnormal   Collection Time    07/21/14  3:04 PM      Result Value Ref Range   Sodium 136  135 - 145 mEq/L   Potassium 4.5  3.5 - 5.3 mEq/L   Chloride 95 (*) 96 - 112 mEq/L   CO2 28  19 - 32 mEq/L   Glucose, Bld 200 (*) 70 - 99 mg/dL   BUN 15  6 - 23 mg/dL   Creatinine, Ser 0.58  0.50 - 1.10 mg/dL   Total Bilirubin 0.5  0.2 - 1.2 mg/dL   Alkaline Phosphatase 139 (*) 39 - 117 U/L   AST 14  0 - 37 U/L   ALT 15  0 - 35 U/L   Total Protein 6.8  6.0 - 8.3 g/dL   Albumin 3.2 (*) 3.5 - 5.2 g/dL   Calcium 9.7  8.4 -  10.5 mg/dL     RADIOGRAPHY:   CT AP 06-09-14 without contrast done at [facility] by Dr Joie Bimler as noted above, report to be scanned into this EMR.  MRI ABDOMEN WITHOUT AND WITH CONTRAST 06-17-14 TECHNIQUE:  Multiplanar multisequence MR imaging of the abdomen was performed  both before and after the administration of intravenous contrast.  CONTRAST: 63mL MULTIHANCE GADOBENATE DIMEGLUMINE 529 MG/ML IV SOLN  COMPARISON: Unenhanced CT abdomen pelvis dated 06/09/2014. CT  abdomen pelvis dated 03/23/2012.  FINDINGS:  Motion degraded images.  2.6 x 2.0 cm fluid density structure adjacent to the right heart,  compatible with a benign pericardial cyst or foregut duplication  cyst.  Liver is notable for moderate to severe hepatic steatosis. No  suspicious/enhancing hepatic lesions.  Abnormal rounded/truncated appearance of the distal pancreatic  body/tail (series 8/image 28). Surrounding peripancreatic  fluid/inflammatory changes (series 3/image 24). This appearance  suggests acute/chronic pancreatitis, and specifically raises the  possibility of igG-mediated autoimmune pancreatitis. Underlying  pancreatic mass/neoplasm is not excluded. Notably, this  appearance/configuration of the pancreas is  new when compared to  2013.  Associated splenic vein thrombosis.  Spleen and adrenal glands are within normal limits.  Trace perihepatic and small volume perisplenic ascites.  Gallbladder is unremarkable. No intrahepatic or extrahepatic ductal  dilatation.  Kidneys are within normal limits. No hydronephrosis.  No suspicious abdominal lymphadenopathy.  No focal osseous lesions.  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  Associated splenic vein thrombosis and small volume upper abdominal  ascites.  This appearance is new from 2013. Underlying pancreatic  mass/neoplasm is not excluded.  Correlate with serum laboratory evaluation and consider EUS with  biopsy.    CHEST 2 VIEW  06-25-14 COMPARISON: PA and lateral chest 05/16/2008.  FINDINGS:  The lungs are clear. Heart size is normal. No pneumothorax or  pleural effusion. No focal bony abnormality.  IMPRESSION:  No acute disease.    CT ABDOMEN AND PELVIS WITH CONTRAST 07-05-14  COMPARISON: 06/09/2014.  FINDINGS:  Lung Bases: Bibasilar dependent atelectasis noted. Stable appearance  of cystic lesion adjacent to the right heart, suggesting pericardial  cyst.  Liver: No focal abnormality in the liver.  Spleen: No focal mass lesion. No dilatation of the main duct. No  intraparenchymal cyst. No peripancreatic edema.  Stomach: Tiny hiatal hernia. Stomach is markedly distended.  Pancreas: Knee body and tail of the pancreas remains irregularly  thickened, better characterized on the recent MRI.  Gallbladder/Biliary: Gallbladder lumen is apparently sludge filled.  No intra or extrahepatic biliary duct dilatation.  Kidneys/Adrenals: No adrenal nodule. No enhancing lesion in either  kidney.  Bowel Loops: Proximal small bowel is dilated up to 4.3 cm in   diameter. Although a discrete transition stone cannot be identified  in the small bowel, the distal small bowel is decompressed. Colon is  nondilated.  Nodes: No lymphadenopathy in the abdomen or pelvis.  Vasculature: No abdominal aortic aneurysm.  Pelvic Genitourinary: Gas is identified within the bladder,  presumably secondary to recent instrumentation. Uterus is surgically  absent. The large cystic pelvic mass seen previously is no longer  evident.  Bones/Musculoskeletal: Bone windows reveal no worrisome lytic or  sclerotic osseous lesions.  Body Wall: There is edema within the body wall. A trace amount of  gas is seen in the subcutaneous fat of the right paramidline  anterior abdominal wall.  Other:  There is trace intraperitoneal free air, not unexpected on  postoperative day 4. Mesenteric edema and trace interloop mesenteric  fluid is seen in the left upper quadrant jejunal mesenteric. There  is a small amount of free fluid adjacent to the liver and spleen.  Free fluid is seen in the anterior peritoneal cavity of the lower  left abdomen. There is a trace amount of fluid in the cul-de-sac.  IMPRESSION:  Prominent fluid-filled dilated proximal small bowel. The distal  small bowel is decompressed. A discrete transition zone cannot be  identified. Given the lack of distal small bowel distention, a  component of proximal to mid small bowel obstruction is favored over  adynamic ileus.  Intraperitoneal free fluid, more so on the left than the right.  Interval or section of the complex cyst in the pelvis.  Intraperitoneal free air is identified, but this is not unexpected 4  days from surgery.    CT ABDOMEN AND PELVIS WITH CONTRAST (ENTEROGRAPHY)  07-11-14  COMPARISON: Radiographs 07/10/2014, MRI 06/26/2014 and CT  07/05/2014.  FINDINGS:  Lung bases: There are enlarging left-greater-than-right pleural  effusions with associated mild worsening of adjacent bibasilar  atelectasis.  3.3 cm fluid collection along the posterior right heart  border has slightly enlarged and is new from remote priors, probably  loculated pleural fluid.  Liver/Biliary/Pancreas: The liver appears unchanged with focal fat  adjacent to the falciform ligament. There is no suspicious liver  lesion. High-density bile within the gallbladder lumen appears  stable. No calcified gallstones or gallbladder wall thickening  identified. There is no biliary dilatation. There is persistent  ill-defined enlargement of the pancreatic body and tail with soft  tissue density extending posteriorly into the retroperitoneum. The  splenic vein is occluded. There are multiple splenic and gastric  varices. The portal and superior mesenteric veins are patent.  Spleen/Adrenal glands: There is stable chronic nodularity of the  left adrenal gland. The right adrenal gland and spleen appear  unremarkable.  Kidneys/Ureters/Bladder: Both kidneys appear normal without focal  lesion. There is interval development of mild  right-greater-than-left hydronephrosis without significantly delayed  contrast excretion. No obstructing ureteral calculus or adjacent  retroperitoneal mass identified. The bladder appears stable with  focal wall thickening along the right aspect of the bladder dome.  Bowel/Peritoneum: Nasogastric tube is in place. The stomach is  decompressed. The proximal small bowel is normal in caliber. There  is mild distention of the mid small bowel, not unexpected for an  enterography study. The distal small bowel and colon are  decompressed. No focal transition point identified. Ascites has not  significantly increased in volume. There is some omental nodularity  anteriorly, most notable on axial images 44-47. This is not clearly  seen on the most recent study and could reflect a thrombosed  varicosity.  Retroperitoneum/Pelvis: There are no enlarged abdominal or pelvic  lymph nodes. No other significant vascular  findings are  demonstrated. There is no evidence of recurrent pelvic mass.  Abdominal wall: There are stable postsurgical changes within the  anterior abdominal wall. Skin staples have been removed. There is no  residual pneumoperitoneum.  Musculoskeletal: No acute or significant osseus findings.  IMPRESSION:  1. CT enterography study demonstrates nonspecific mid small bowel  distension without focal transition point to suggest high-grade  bowel obstruction.  2. Similar volume of ascites with resolution of postsurgical  pneumoperitoneum. Suggested nodularity anteriorly status post  omentectomy was not clearly seen on the most recent study and may  reflect a  thrombosed varicosity.  3. 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 This could be the sequela of pancreatitis as suggested on  MRI. A mass involving the retropancreatic retroperitoneum cannot be  excluded.  4. Enlarging bilateral pleural effusions with increased bibasilar  atelectasis.  5. New mild bilateral hydronephrosis, possibly related to bladder  distention. No high-grade ureteral obstruction. There is some  bladder wall thickening along the right aspect of the bladder dome.     PATHOLOGY [place], Kristin Meadows: 07/01/2014 Client: [facility] Accession: [id number] Received: 07/01/2014 Everitt Amber, MDEPORT OF SURGICAL PATHOLOGY FINAL DIAGNOSIS Diagnosis 1. Adnexa - ovary +/- tube, neoplastic, right - MUCINOUS CYSTADENOCARCINOMA. - TUMOR PRESENT ON SURFACE OF OVARY. - SEE ONCOLOGY TEMPLATE. 2. Omentum, resection for tumor - POSITIVE FOR ADENOCARCINOMA. - SEE COMMENT. 3. Peritoneum, biopsy, anterior pelvic - POSITIVE FOR ADENOCARCINOMA. - SEE COMMENT. Microscopic Comment 1. OVARY Specimen(s): Right ovary with fallopian tube; omentum resection and anterior pelvic peritoneum biopsy. Procedure: (including lymph node sampling) Exploratory laparotomy with right  salpingo-oophorectomy, omentectomy and radical debulking for ovarian cancer. Primary tumor site (including laterality): Right ovary. Ovarian surface involvement: Yes Ovarian capsule intact without fragmentation: Yes. Maximum tumor size (cm): 16 cm. Histologic type: Mucinous adenocarcinoma. Grade: G1-G2: Well to moderately differentiated. Peritoneal implants: (specify invasive or non-invasive): Yes, invasive adenocarcinoma is present in the peritoneum. Pelvic extension (list additional structures on separate lines and if involved): Pelvic peritoneum is involved; no other structures are present from the pelvis for evaluation. Lymph nodes: No lymph nodes examined. TNM code: At least pT3a, pNX, see comments. FIGO Stage: At least FIGO stage IIIA, needs clinical correlation, see comments. Comments: Based on the pathologic findings in the omentum specimen, only microscopic tumor is identified with no macroscopic tumor nodules identified which is compatible with stage pT3a. However, based on the operative report the final tumor stage is likely higher (if there are macroscopic tumor nodules less than 2 cm, this corresponds to a pT3b / FIGO stage IIIB; if macroscopic tumor nodules are present measuring greater than 2 cm, this would correspond to a pT3c / stage FIGO grade IIIC). Please correlate with operative impression. The ovarian adenocarcinoma demonstrates very well differentiated (more mucinous areas) as well as more poorly differentiated areas. These findings along with a unilateral large ovarian mass fit best with a primary mucinous ovarian adenocarcinoma. Benign fallopian tube is also present within the specimen. Both Dr. Lyndon Code and Dr. Donato Heinz have seen the tumor in consultation with agreement that the findings represent a primary ovarian tumor. Specimens #2 and 3: Tumor in both the omentum and peritoneum is identical to the tumor in the ovary. Dr. Donato Heinz has seen the second and third specimens in  consultation with agreement that they both are positive for adenocarcinoma.   DISCUSSION:  I have discussed lovenox with Dr Denman George: as patient is out 3 weeks from surgery and up walking well at home, will not resume lovenox now.  We have discussed circumstances surrounding diagnosis, surgical findings and pathology information, questions re pancreas findings and consideration of GI evaluation possibly with EUS, and post operative complications. I have mentioned that GI cancers can be mucinous adenocarcinomas and at times do metastasize to ovary, tho pathologists do feel that findings are consistent with primary ovarian malignancy. As choice of chemotherapy regimen might be adjusted if GI malignancy is documented, and as gastroenterology assistance might be helpful with recent pancreatitis symptoms as she goes thru treatment, she is in agreement with GI evaluation. I was  unable to speak with Dr Oretha Caprice at time of patient's visit, but will try to contact him directly to discuss. Hopefully evaluation can be expedited as I would like to begin chemotherapy in next 1-2 weeks if possible. I did talk with her about carboplatin with weekly taxol, as this may be better tolerated including with her neuropathy, at least initially (if no other regimen more appropriate pending GI evaluation). She is aware of possible taxol side effects of peripheral neuropathy. DIscussed diet. Reviewed CBC and CMET from today at time of visit. She prefers no PAC if peripheral veins adequate, tho understands that she may need PAC even if start with peripheral access. Patient and husband understand that we will let them know appointments after I speak with GI. All questions were answered and they are in agreement with chemotherapy as above and plan as above  She will need other antiemetics and steroid premeds for taxol, not sent to pharmacy today.     IMPRESSION / PLAN:   1.mucinous cystadenocarcinoma of right ovary: post  suboptimal debulking 07-01-14, recommendation for carboplatin + taxane chemotherapy 2.abnormal pancreatic body and tail by imaging, and by intraoperative evaluation, with some pancreatitis postoperatively and elevated CA 19-9. Will ask GI to consider EUS. Pain seems most consistent with pancreatic at present, continuing narcotics 3.SBO postoperatively: resolved with conservative care. Add senna or miralax to stool softeners now. 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  5. Diabetes: on oral agent prior to this diagnosis, with worsening of Hb A1c and higher blood sugars in past few months. Will need to follow blood sugars at home at least when chemo begins. 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 They can contact this office for questions or concerns at any time prior to next visit.  Time spent  >75 min , including >50% discussion and coordination of care.  Consent obtained for carbo taxol if that is eventual choice. Chemo orders not done yet pending above. Antiemetics as above. Prior auth not requested until chemo orders completed.   Wetona Viramontes P, MD 07/21/2014 9:04 PM

## 2014-07-30 NOTE — Interval H&P Note (Signed)
History and Physical Interval Note:  07/30/2014 9:43 AM  Kristin Meadows  has presented today for surgery, with the diagnosis of abnormal ct scan  The various methods of treatment have been discussed with the patient and family. After consideration of risks, benefits and other options for treatment, the patient has consented to  Procedure(s): ESOPHAGOGASTRODUODENOSCOPY (EGD) WITH PROPOFOL (N/A) ESOPHAGEAL ENDOSCOPIC ULTRASOUND (EUS) RADIAL (N/A) as a surgical intervention .  The patient's history has been reviewed, patient examined, no change in status, stable for surgery.  I have reviewed the patient's chart and labs.  Questions were answered to the patient's satisfaction.     Kirti Carl M  Assessment:  1.  Nausea, vomiting, early satiety. 2.  Mucinous cystadenocarcinoma of ovary. 3.  Irregular pancreas on CT/MRI scan with mild elevation Ca 19-9.  Pancreatic primary or pancreatic involvement?  Plan:  1.  Endoscopy and upper endoscopic ultrasound (with possible biopsies). 2.  Risks (bleeding, infection, bowel perforation that could require surgery, sedation-related changes in cardiopulmonary systems), benefits (identification and possible treatment of source of symptoms, exclusion of certain causes of symptoms), and alternatives (watchful waiting, radiographic imaging studies, empiric medical treatment) of upper endoscopy and upper endoscopic ultrasound with possible biopsies (EGD + EUS +/- FNA) were explained to patient/family in detail and patient wishes to proceed. 1.  Endoscopy

## 2014-07-30 NOTE — Op Note (Signed)
[facility] [place], [address]   ENDOSCOPIC ULTRASOUND PROCEDURE REPORT  PATIENT: Kristin Meadows, Kristin Meadows  MR#: [id number] BIRTHDATE: [birth year]  GENDER: Female ENDOSCOPIST: Arta Silence, MD REFERRED BY:  Evlyn Clines, M.D. PROCEDURE DATE:  07/30/2014 PROCEDURE:   Upper EUS w/FNA ASA CLASS:      Class III INDICATIONS:   1.  ovarian malignancy, abnormal CT/MRI pancreas. MEDICATIONS: MAC sedation, administered by CRNA and Cipro 400 mg IV   DESCRIPTION OF PROCEDURE:   After the risks benefits and alternatives of the procedure were  explained, informed consent was obtained. The patient was then placed in the left, lateral, decubitus postion and IV sedation was administered. Throughout the procedure, the patients blood pressure, pulse and oxygen saturations were monitored continuously.  Under direct visualization, the diagnostic gastroscope, forward-viewing radial echoendoscope and oblique-viewing linear echoendoscopes were sequentially introduced through the mouth  and advanced to the second portion of the duodenum .  Water was used as necessary to provide an acoustic interface.  Upon completion of the imaging, water was removed and the patient was sent to the recovery room in satisfactory condition.   FINDINGS:      EGD:  Normal to second portion of the duodenum. EUS:  Ascites noted.  Head, uncinate, neck and body of pancreas normal.  Non-dilated CBD and PD.  Ampulla normal via EUS.  At the tail of the pancreas, right adjacent to the splenic hilum, there was abrupt transition to a 32 x 12mm hypoechoic round ill-defined lesion.  Lesion biopsied x 4 (25g, 3 for slides, one in its entirety for cell block).  IMPRESSION:     Tail of pancreas lesion, biopsied.  RECOMMENDATIONS:     1.  Watch for potential complications of procedure. 2.  Cipro 500 mg po bid x 5 days. 3.  Await final cytology results. 4.  Will discuss case with Dr.  Marko Plume.   _______________________________ Lorrin MaisArta Silence, MD 07/30/2014 11:03 AM   CC:

## 2014-07-30 NOTE — Anesthesia Preprocedure Evaluation (Addendum)
Anesthesia Evaluation  Patient identified by MRN, date of birth, ID band Patient awake    Reviewed: Allergy & Precautions, H&P , NPO status , Patient's Chart, lab work & pertinent test results  Airway Mallampati: III TM Distance: >3 FB Neck ROM: Full    Dental  (+) Teeth Intact, Dental Advisory Given   Pulmonary neg pulmonary ROS,  breath sounds clear to auscultation        Cardiovascular hypertension, + dysrhythmias (+LBBB) Rhythm:Regular Rate:Normal     Neuro/Psych PSYCHIATRIC DISORDERS Anxiety  Neuromuscular disease    GI/Hepatic Neg liver ROS, hiatal hernia, GERD-  Medicated,  Endo/Other  diabetes, Type 2, Oral Hypoglycemic Agents  Renal/GU negative Renal ROS  negative genitourinary   Musculoskeletal negative musculoskeletal ROS (+)   Abdominal   Peds  Hematology  (+) anemia ,   Anesthesia Other Findings   Reproductive/Obstetrics negative OB ROS                          Anesthesia Physical Anesthesia Plan  ASA: III  Anesthesia Plan: MAC   Post-op Pain Management:    Induction: Intravenous  Airway Management Planned: Simple Face Mask  Additional Equipment: None  Intra-op Plan:   Post-operative Plan:   Informed Consent: I have reviewed the patients History and Physical, chart, labs and discussed the procedure including the risks, benefits and alternatives for the proposed anesthesia with the patient or authorized representative who has indicated his/her understanding and acceptance.   Dental advisory given  Plan Discussed with: CRNA  Anesthesia Plan Comments:        Anesthesia Quick Evaluation

## 2014-07-30 NOTE — Anesthesia Postprocedure Evaluation (Signed)
  Anesthesia Post-op Note  Patient: Kristin Meadows  Procedure(s) Performed: Procedure(s): ESOPHAGOGASTRODUODENOSCOPY (EGD) WITH PROPOFOL (N/A) ESOPHAGEAL ENDOSCOPIC ULTRASOUND (EUS) RADIAL (N/A)  Patient Location: PACU  Anesthesia Type:MAC  Level of Consciousness: awake, alert  and oriented  Airway and Oxygen Therapy: Patient Spontanous Breathing  Post-op Pain: none  Post-op Assessment: Post-op Vital signs reviewed  Post-op Vital Signs: Reviewed  Last Vitals:  Filed Vitals:   07/30/14 1140  BP: 116/42  Pulse:   Temp:   Resp:     Complications: No apparent anesthesia complications

## 2014-07-30 NOTE — Telephone Encounter (Signed)
Verified appointment for patient  And called in prescription for Ativan.

## 2014-07-31 ENCOUNTER — Encounter (HOSPITAL_COMMUNITY): Payer: Self-pay | Admitting: Gastroenterology

## 2014-08-03 ENCOUNTER — Other Ambulatory Visit: Payer: Self-pay | Admitting: Oncology

## 2014-08-03 DIAGNOSIS — C561 Malignant neoplasm of right ovary: Secondary | ICD-10-CM

## 2014-08-03 DIAGNOSIS — C252 Malignant neoplasm of tail of pancreas: Secondary | ICD-10-CM

## 2014-08-04 ENCOUNTER — Telehealth: Payer: Self-pay | Admitting: *Deleted

## 2014-08-04 ENCOUNTER — Telehealth: Payer: Self-pay

## 2014-08-04 NOTE — Telephone Encounter (Signed)
Treatment scheduled for 08-08-14 at 0900. Told Kristin Meadows that Dr. Marko Plume will discuss and decide treatment regimen at visit 08-06-14 and a spot is being held for treatment on 08-08-14 as noted above. Pt. Verbalized understanding.

## 2014-08-04 NOTE — Telephone Encounter (Signed)
Message copied by Baruch Merl on Mon Aug 04, 2014  6:49 PM ------      Message from: Gordy Levan      Created: Sun Aug 03, 2014 12:39 PM       POF done for long taxol carbo Friday 8-28. Message sent to Sharyn Lull now because I need to be sure chemo spot is available.            RN please call patient early in week, before I see her on Wed. Tell her that I have held chemo spot on Friday and we will discuss/ decide regimen when I see her on Wed.                  thanks ------

## 2014-08-04 NOTE — Telephone Encounter (Signed)
Per staff message and POF I have scheduled appts. Advised scheduler of appts. JMW  

## 2014-08-05 ENCOUNTER — Telehealth: Payer: Self-pay

## 2014-08-05 ENCOUNTER — Telehealth: Payer: Self-pay | Admitting: Oncology

## 2014-08-05 DIAGNOSIS — C561 Malignant neoplasm of right ovary: Secondary | ICD-10-CM

## 2014-08-05 MED ORDER — LORAZEPAM 0.5 MG PO TABS: ORAL_TABLET | ORAL | Status: DC

## 2014-08-05 MED ORDER — DEXAMETHASONE 4 MG PO TABS: ORAL_TABLET | ORAL | Status: DC

## 2014-08-05 MED ORDER — ONDANSETRON HCL 8 MG PO TABS: 8.0000 mg | ORAL_TABLET | Freq: Two times a day (BID) | ORAL | Status: DC | PRN

## 2014-08-05 NOTE — Telephone Encounter (Signed)
ADDED LB/LL 9/2. PT [facility] ACTIVE AND TO BE GIVEN NEW SCHEDULE AT F/U VISIT W/LL TOMORROW (8/26). COMMENTS ALSO ADDED TO 8/26 APPT NOTES TO SEND PT FOR NEW SCHEDULE.

## 2014-08-05 NOTE — Telephone Encounter (Signed)
Message copied by Baruch Merl on Tue Aug 05, 2014  3:30 PM ------      Message from: Gordy Levan      Created: Tue Aug 05, 2014 11:23 AM       Expect to start taxol carbo on 8-28. She needs decadron 20 mg with food 12 hr/6 hr prior, zofran, ativan SL/po to pharmacy. I will see her on 8-26, but maybe could already have this done            thanks ------

## 2014-08-06 ENCOUNTER — Ambulatory Visit (HOSPITAL_BASED_OUTPATIENT_CLINIC_OR_DEPARTMENT_OTHER): Payer: Managed Care, Other (non HMO) | Admitting: Oncology

## 2014-08-06 ENCOUNTER — Ambulatory Visit (HOSPITAL_BASED_OUTPATIENT_CLINIC_OR_DEPARTMENT_OTHER): Payer: Managed Care, Other (non HMO)

## 2014-08-06 ENCOUNTER — Encounter: Payer: Self-pay | Admitting: Oncology

## 2014-08-06 ENCOUNTER — Telehealth: Payer: Self-pay | Admitting: Oncology

## 2014-08-06 ENCOUNTER — Telehealth: Payer: Self-pay | Admitting: *Deleted

## 2014-08-06 VITALS — BP 128/81 | HR 112 | Temp 99.0°F | Resp 18 | Ht 62.0 in | Wt 144.7 lb

## 2014-08-06 DIAGNOSIS — C561 Malignant neoplasm of right ovary: Secondary | ICD-10-CM

## 2014-08-06 DIAGNOSIS — C7960 Secondary malignant neoplasm of unspecified ovary: Secondary | ICD-10-CM

## 2014-08-06 DIAGNOSIS — C786 Secondary malignant neoplasm of retroperitoneum and peritoneum: Secondary | ICD-10-CM

## 2014-08-06 DIAGNOSIS — B373 Candidiasis of vulva and vagina: Secondary | ICD-10-CM

## 2014-08-06 DIAGNOSIS — C801 Malignant (primary) neoplasm, unspecified: Secondary | ICD-10-CM

## 2014-08-06 DIAGNOSIS — C779 Secondary and unspecified malignant neoplasm of lymph node, unspecified: Secondary | ICD-10-CM

## 2014-08-06 DIAGNOSIS — G609 Hereditary and idiopathic neuropathy, unspecified: Secondary | ICD-10-CM

## 2014-08-06 DIAGNOSIS — C252 Malignant neoplasm of tail of pancreas: Secondary | ICD-10-CM

## 2014-08-06 DIAGNOSIS — C50919 Malignant neoplasm of unspecified site of unspecified female breast: Secondary | ICD-10-CM

## 2014-08-06 DIAGNOSIS — E119 Type 2 diabetes mellitus without complications: Secondary | ICD-10-CM

## 2014-08-06 DIAGNOSIS — B3731 Acute candidiasis of vulva and vagina: Secondary | ICD-10-CM

## 2014-08-06 LAB — CBC WITH DIFFERENTIAL/PLATELET
BASO%: 1 % (ref 0.0–2.0)
Basophils Absolute: 0.1 10*3/uL (ref 0.0–0.1)
EOS%: 1.6 % (ref 0.0–7.0)
Eosinophils Absolute: 0.1 10*3/uL (ref 0.0–0.5)
HEMATOCRIT: 36.5 % (ref 34.8–46.6)
HGB: 11.7 g/dL (ref 11.6–15.9)
LYMPH%: 17.9 % (ref 14.0–49.7)
MCH: 27.5 pg (ref 25.1–34.0)
MCHC: 32.2 g/dL (ref 31.5–36.0)
MCV: 85.4 fL (ref 79.5–101.0)
MONO#: 0.4 10*3/uL (ref 0.1–0.9)
MONO%: 5.4 % (ref 0.0–14.0)
NEUT#: 5.4 10*3/uL (ref 1.5–6.5)
NEUT%: 74.1 % (ref 38.4–76.8)
PLATELETS: 233 10*3/uL (ref 145–400)
RBC: 4.27 10*6/uL (ref 3.70–5.45)
RDW: 14 % (ref 11.2–14.5)
WBC: 7.3 10*3/uL (ref 3.9–10.3)
lymph#: 1.3 10*3/uL (ref 0.9–3.3)

## 2014-08-06 LAB — COMPREHENSIVE METABOLIC PANEL (CC13)
ALT: 8 U/L (ref 0–55)
AST: 10 U/L (ref 5–34)
Albumin: 3.4 g/dL — ABNORMAL LOW (ref 3.5–5.0)
Alkaline Phosphatase: 77 U/L (ref 40–150)
Anion Gap: 10 mEq/L (ref 3–11)
BUN: 9.4 mg/dL (ref 7.0–26.0)
CO2: 26 mEq/L (ref 22–29)
Calcium: 9.6 mg/dL (ref 8.4–10.4)
Chloride: 101 mEq/L (ref 98–109)
Creatinine: 0.6 mg/dL (ref 0.6–1.1)
Glucose: 169 mg/dl — ABNORMAL HIGH (ref 70–140)
Potassium: 3.7 mEq/L (ref 3.5–5.1)
Sodium: 137 mEq/L (ref 136–145)
Total Bilirubin: 0.78 mg/dL (ref 0.20–1.20)
Total Protein: 7 g/dL (ref 6.4–8.3)

## 2014-08-06 MED ORDER — FLUCONAZOLE 100 MG PO TABS: 100.0000 mg | ORAL_TABLET | Freq: Every day | ORAL | Status: DC

## 2014-08-06 MED ORDER — ONDANSETRON 8 MG PO TBDP: 8.0000 mg | ORAL_TABLET | Freq: Two times a day (BID) | ORAL | Status: DC | PRN

## 2014-08-06 MED ORDER — OXYCODONE HCL 5 MG PO TABS: 5.0000 mg | ORAL_TABLET | ORAL | Status: DC | PRN

## 2014-08-06 NOTE — Progress Notes (Signed)
OFFICE PROGRESS NOTE   08/06/2014   Physicians:Rossi, Dewayne Shorter, MD (PCP [facility], [place]); Ellouise Newer (gyn [place]), cardiology [place], neurologist [place]; Jackquline Denmark (GI [place]); Joie Bimler (urologist [place]), Arta Silence   INTERVAL HISTORY:  Patient is seen, together with husband, daughter and mother, in continuing attention to mucinous cystadenocarcinoma involving right ovary and now also known to involve tail of pancreas from endoscopic [place] with biopsy by Dr Paulita Fujita on 07-30-14. Since she was here last, I have discussed case with Drs Paulita Fujita, Calton Golds and my partner in GI oncology (see discussion below).  Patient tolerated EUS without significant problems, finished post procedure Cipro yesterday. She has had variable po intake and variable nausea, able to eat well (veggie pizza, protein foods) last weekend and 8-24, then nausea all day on 8-25 with difficulty keeping down even liquids and meds. Today she has had crackers and ~ 6 oz fluid, did take phenergan this AM. Bowels are moving with senna 2 daily and dulcolox tabs 2 bid. Highest temp has been 99.3. Pain is low back and mid to lower abdomen, generally manageable with Percocet 5-325  2 bid tho more discomfort yesterday such that she took 2 tabs qid in addition to oxymorphone 10 mg q 12 hrs.    She had PICC in hospital for TNA, does not have central line now. She has not had genetics testing.  ONCOLOGIC HISTORY Patient has history of cystitis 2 years ago which presented with suprapubic and back pain, resolved with macrobid x 3 months; CT then was not remarkable. She developed similar symptoms over several months recently and was seen by Dr Nila Nephew with finding of urinary retention. CT AP without contrast at [facility] 06-09-14 showed 9.5 x 14.8 x 10.5 cm complex cystic mass in central pelvis with uterus surgically absent, no adenopathy abdomen or pelvic sidewall, apparent  thickening tail of pancreas and question of peripancreatic stranding, 2.5 x 1.8 cm low density structure adjacent to right atrium. She was seen by Dr Ellouise Newer on 06-11-14, with pelvic mass on exam, CA 125 152 and CEA 3.28 both on 06-11-14. She was seen by Dr Denman George on 06-16-14, at which time she reported 10 lb weight loss in 2 weeks, early satiety, increased constipation and pain; her exam revealed large smooth pelvic mass filling pelvis, adherent to vagina and in close approximation to rectum, minimally mobile. CA 19-9 from 06-16-14 was 224.7. 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 She had exploratory laparotomy with right salpingo-oophorectomy, omentectomy and radical debulking of ovarian cancer by Dr Denman George at [facility] on 07-01-14. Intraoperative findings were of 20 cm cystic mass arising from right ovary, filling pelvis centrally and adherent to sigmoid colon, 200 cc ascites, peritoneal tumor plaques anterior pelvis and right diaphragm with TNTC 1 cm nodules small bowel mesentery and bowel wall; enlarged pancreatic body was described as rigid and abnormally firm to palpation. At completion of surgery there was residual tumor at the mesentery of the small and large intestine, thin tumor plaques on the diaphragm and pelvic peritoneum, and gallbladder serosa and pouch of Randol Kern representing an incomplete and suboptimal cytoreduction.  Pathology [id number] from 07-01-14 showed 16 x 11 x 7 cm mucinous cystadenocarcinoma of right ovary with capsule intact, tumor on surface of  ovary and identical tumor involving omentum and peritoneal biopsies, no nodes evaluated, for pT3bpNX/ FIGO IIIB. This pathology was reviewed by 3  pathologists in [facility]. Patient was stable for DC home on 07-03-14. Within 24 hours after discharge she developed nausea, vomiting and abdominal pain, requiring readmission from 7-25 thru 07-15-14 for SBO and mild pancreatitis. She was managed conservatively in hospital, followed by Dr Denman George, did require NG decompression and support with TNA via PICC. Repeat CT AP was negative for high grade bowel obstruction or transition point; PICC was removed prior to DC home and diabetes was managed with SS insulin. Lovenox was continued in hospital but was not resumed at DC (initially planned x 4 weeks post op).  Review of systems as above, also: No increased SOB or cough. No pain thru to upper/mid back. Occasional vomiting, including antiemetics at times. No LE swelling. No bleeding. No problems with cipro. Vaginal itching since Cipro, history of vaginal yeast with antibiotics. Remainder of 10 point Review of Systems negative.  Objective:  Vital signs in last 24 hours:  BP 128/81  Pulse 112  Temp(Src) 99 F (37.2 C) (Oral)  Resp 18  Ht 5\' 2"  (1.575 m)  Wt 144 lb 11.2 oz (65.635 kg)  BMI 26.46 kg/m2 Weight is down 1 lb. Looks tired but very pleasant and cooperative, NAD. Alert, oriented and appropriate. Ambulatory without difficulty, able to get on and off exam table and change positions there. Sipping fluids thru visit.    Family very supportive. No alopecia  HEENT:PERRL, sclerae not icteric. Oral mucosa a little dry without lesions, posterior pharynx clear.  Neck supple. No JVD.  Lymphatics:no cervical,suraclavicular, axillary or inguinal adenopathy Resp: clear to auscultation bilaterally and normal percussion bilaterally Cardio: regular rate and rhythm. No gallop. Clear heart sounds GI: soft, nontender including epigastrium, not more distended, no mass or organomegaly. Some bowel sounds. Surgical incision not remarkable. Musculoskeletal/ Extremities: without pitting edema, cords,  tenderness. Spine not tender. Neuro: no change peripheral neuropathy (right lateral and upper thigh, left lower face). Otherwise nonfocal. PSYCH: appropriate mood and affect Skin without rash, ecchymosis, petechiae   Lab Results:  Results for orders placed in visit on 08/06/14  CBC WITH DIFFERENTIAL      Result Value Ref Range   WBC 7.3  3.9 - 10.3 10e3/uL   NEUT# 5.4  1.5 - 6.5 10e3/uL   HGB 11.7  11.6 - 15.9 g/dL   HCT 36.5  34.8 - 46.6 %   Platelets 233  145 - 400 10e3/uL   MCV 85.4  79.5 - 101.0 fL   MCH 27.5  25.1 - 34.0 pg   MCHC 32.2  31.5 - 36.0 g/dL   RBC 4.27  3.70 - 5.45 10e6/uL   RDW 14.0  11.2 - 14.5 %   lymph# 1.3  0.9 - 3.3 10e3/uL   MONO# 0.4  0.1 - 0.9 10e3/uL   Eosinophils Absolute 0.1  0.0 - 0.5 10e3/uL   Basophils Absolute 0.1  0.0 - 0.1 10e3/uL   NEUT% 74.1  38.4 - 76.8 %   LYMPH% 17.9  14.0 - 49.7 %   MONO% 5.4  0.0 - 14.0 %   EOS% 1.6  0.0 - 7.0 %   BASO% 1.0  0.0 - 2.0 %  COMPREHENSIVE METABOLIC PANEL (MA00)      Result Value Ref Range   Sodium 137  136 - 145 mEq/L   Potassium 3.7  3.5 - 5.1 mEq/L   Chloride 101  98 -  109 mEq/L   CO2 26  22 - 29 mEq/L   Glucose 169 (*) 70 - 140 mg/dl   BUN 9.4  7.0 - 26.0 mg/dL   Creatinine 0.6  0.6 - 1.1 mg/dL   Total Bilirubin 0.78  0.20 - 1.20 mg/dL   Alkaline Phosphatase 77  40 - 150 U/L   AST 10  5 - 34 U/L   ALT 8  0 - 55 U/L   Total Protein 7.0  6.4 - 8.3 g/dL   Albumin 3.4 (*) 3.5 - 5.0 g/dL   Calcium 9.6  8.4 - 10.4 mg/dL   Anion Gap 10  3 - 11 mEq/L     Studies/Results:  Endoscopic [place] 07-31-15 FINDINGS: EGD: Normal to second portion of the duodenum. EUS: Ascites noted. Head, uncinate, neck and body of pancreas normal. Non-dilated CBD and PD. Ampulla normal via EUS. At the tail of the pancreas, right adjacent to the splenic hilum, there was abrupt transition to a 32 x 25mm hypoechoic round ill-defined lesion. Lesion biopsied x 4   Ellender, Daphney Collected: 07/30/2014 Client: [facility] Accession: [id number] Received: 07/30/2014 Satira Anis. Outlaw, MDCYTOPATHOLOGY REPORT Adequacy Reason Satisfactory For Evaluation. 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 HOWEVER, IF CLINICALLY THE PANCREATIC MASS IS CONSIDERED TO REPRESENT A PANCREATIC PRIMARY, THEN A MUCIN PRODUCING PANCREATIC NEOPLASM WITH AT LEAST HIGH GRADE DYSPLASIA IS IN THE DIFFERENTIAL.  Mammograms were done this week at [facility] and I will request that report  Medications: I have reviewed the patient's current medications. She will increase oxymorphone to 20 mg bid using the 10 mg tabs that she has; likely will need another prescription when she is back for chemo on 08-08-14 with correct dose change then. Will change oxycodone APAP to oxycodone 5 mg 1-3 every 4 hrs prn. Diflucan 100 mg daily x7 for vaginal yeast. Add zofran 8 mg q 8 hr prn, as ODT if insurance will cover. Refill ativan, which she can use SL if needed. Decadron will be 20 mg 12 hrs and 6 hrs prior to first dose taxol, then if no problems will be decreased to 20 mg 12 hrs prior for subsequent doses. Will use SS insulin at [facility] day of chemo  DISCUSSION From my discussions with other MDs: Dr Paulita Fujita information as above Dr Tillman Sers pathology: findings on gross and microscopic path from gyn oncology surgery felt to be ovarian primary. He is aware of the pancreatic information Dr Denman George: at time of surgery, less involvement in pelvis and less usual spread pattern of typical ovarian malignancy, ,with increased adenopathy in upper abd and rock hard pancreas to palpation. She feels this is more typical of what she sees intraoperatively with primary GI  malignancy. She is still comfortable with starting with carbo taxol regimen. Dr Benay Spice: if clearly pancreatic primary would consider FOLFIRI/FOLFOX  Abraxane gemzar reasonable for pancreas from his standpoint, as would also give ovarian coverage  I have requested [facility] testing from [facility]  Discussed with patient and family fact that the mucinous cystadenocarcinomas are rare types of both ovarian and pancreatic malignancies. Discussed information from other MD specialty areas as above. [facility], which she and family do not want now, tho may still be appropriate later (mentioned GI at [facility]  with [facility] information). Options for treatment promptly at this facility would be carboplatin + weekly taxol (in preference to q 3 week taxol for tolerance) or gemzar abraxane. Obviously whatever regimen is chosen can be changed if not tolerated or not helpful.  I have offered IVF today, however she prefers to try to push po's at home. Family knows to call if she needs IVF in next 24 hours; will add additional IVF to chemo on 8-28 and have IVF available on 8-31 if needed (could add Sat 8-29 additionally depending on how she is day of chemo).  Medication instructions:  Fine to use phenergan every 6 hours round the clock for now, or you can try zofran 8 mg every 8 hrs.  We will have IV fluids available on Mon 8-31. IF you are drinking fluids well and don't need these, you can call [phone] and cancel.  If you are not drinking well when you come for chemo on Friday, we can also add in IVF on Saturday -- let RN know if so.  Diflucan prescription to pharmacy, one daily for yeast from antibiotic.   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 Will begin chemo with carbo weekly taxol on 8-28. Will request [facility] information. Will give IVF on 8-31 (and/or 8-29 see above). I  will see her on 9-2. 2. Diabetes: on oral agent prior to this diagnosis, with worsening of Hb A1c and higher blood sugars in past few months. Will need to follow blood sugars at home 3.SBO postoperatively: resolved with conservative care. Add senna or miralax to stool softeners now.  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 Chemo orders entered, with SS insulin and additional IVF. Time spent >50 min including >50% counseling and coordination of care.   Donnald Tabar P, MD   08/06/2014, 1:46 PM

## 2014-08-06 NOTE — Telephone Encounter (Signed)
Per staff message and POF I have scheduled appts. Advised scheduler of appts. JMW  

## 2014-08-06 NOTE — Patient Instructions (Addendum)
First chemo will be 8-28 @ 9AM  12 hours and again 6 hours before chemo (so at 9 PM on Thurs 8-27 and at 3 AM on Fri 8-28) you need to eat a little something and take decadron five of the 4 mg tablets = 20 mg each dose.   Increase oxymorphone to two of the 10 mg tablets twice daily. When you come on Friday, let RN tell Dr Marko Plume how you are doing with this, so we can fix next prescription.   Fine to use phenergan every 6 hours round the clock for now, or you can try zofran 8 mg every 8 hrs.  We will have IV fluids available on Mon 8-31. IF you are drinking fluids well and don't need these, you can call [phone] and cancel.  If you are not drinking well when you come for chemo on Friday, we can also add in IVF on Saturday -- let RN know if so.  Diflucan prescription to pharmacy, one daily for yeast from antibiotic.

## 2014-08-07 ENCOUNTER — Telehealth: Payer: Self-pay | Admitting: *Deleted

## 2014-08-07 LAB — CA 125(PREVIOUS METHOD): CA 125: 176.2 U/mL — AB (ref 0.0–30.2)

## 2014-08-07 LAB — CANCER ANTIGEN 19-9: CA 19-9: 203.4 U/mL — ABNORMAL HIGH (ref <35.0)

## 2014-08-07 LAB — CA 125: CA 125: 286 U/mL — AB (ref <35)

## 2014-08-07 NOTE — Telephone Encounter (Signed)
Received mammogram report from [facility]. One copy sent to HIM to be scanned and once planned on Dr. Mariana Kaufman desk for review.

## 2014-08-07 NOTE — Telephone Encounter (Signed)
Called and spoke with patient regarding taking decadron tablets prior to chemotherapy tomorrow. Told patient to take first dose at 9pm and then second dose at 3am since her treatment is scheduled at 9am tomorrow. Patient agreeable to this and appreciative of call.

## 2014-08-07 NOTE — Telephone Encounter (Signed)
Message copied by Christa See on Thu Aug 07, 2014  4:23 PM ------      Message from: Gordy Levan      Created: Thu Aug 07, 2014  9:32 AM       Please call her today and review decadron instructions prior to taxol on 8-28:            Decadron 4 mg:  Five tablets with at least a little food 12 hours and again 6 hours prior to taxol, which will be ~ 9 PM tonight and ~ 3 AM on 8-28.      I expect this will make her blood sugars higher, as it does with everyone whether or not they have diabetes. We will check blood sugar here and cover with SS insulin, as well as giving her extra IV NS.            This was not typed into her patient instructions at visit yesterday - sorry ------

## 2014-08-07 NOTE — Telephone Encounter (Signed)
Message copied by Christa See on Thu Aug 07, 2014  4:33 PM ------      Message from: Gordy Levan      Created: Thu Aug 07, 2014  8:57 AM       Please request copy of recent mammogram report from [facility] - sent one copy to HIM to scan and put one copy on my desk please      No hurry      thanks ------

## 2014-08-07 NOTE — Telephone Encounter (Signed)
Called patient back and let her know as noted below by Dr. Marko Plume that the steroids will probably raise her blood sugar but that we will check it when she is here tomorrow and give her insulin and extra normal saline if needed.

## 2014-08-08 ENCOUNTER — Ambulatory Visit (HOSPITAL_BASED_OUTPATIENT_CLINIC_OR_DEPARTMENT_OTHER): Payer: Managed Care, Other (non HMO)

## 2014-08-08 ENCOUNTER — Other Ambulatory Visit (HOSPITAL_COMMUNITY)
Admission: RE | Admit: 2014-08-08 | Discharge: 2014-08-08 | Disposition: A | Discharge status: Home / Self Care | Payer: Managed Care, Other (non HMO) | Source: Ambulatory Visit | Attending: Oncology | Admitting: Oncology

## 2014-08-08 ENCOUNTER — Other Ambulatory Visit: Payer: Self-pay | Admitting: *Deleted

## 2014-08-08 ENCOUNTER — Other Ambulatory Visit: Payer: Self-pay | Admitting: Oncology

## 2014-08-08 VITALS — BP 142/70 | HR 86 | Temp 97.4°F | Resp 17

## 2014-08-08 DIAGNOSIS — Z5111 Encounter for antineoplastic chemotherapy: Secondary | ICD-10-CM

## 2014-08-08 DIAGNOSIS — E119 Type 2 diabetes mellitus without complications: Secondary | ICD-10-CM

## 2014-08-08 DIAGNOSIS — C786 Secondary malignant neoplasm of retroperitoneum and peritoneum: Secondary | ICD-10-CM

## 2014-08-08 DIAGNOSIS — C569 Malignant neoplasm of unspecified ovary: Secondary | ICD-10-CM | POA: Insufficient documentation

## 2014-08-08 DIAGNOSIS — C561 Malignant neoplasm of right ovary: Secondary | ICD-10-CM

## 2014-08-08 DIAGNOSIS — C801 Malignant (primary) neoplasm, unspecified: Secondary | ICD-10-CM

## 2014-08-08 LAB — WHOLE BLOOD GLUCOSE
GLUCOSE: 230 mg/dL — AB (ref 70–100)
HRS PC: 2 Hours

## 2014-08-08 MED ORDER — SODIUM CHLORIDE 0.9 % IV SOLN
Freq: Once | INTRAVENOUS | Status: AC
  Administered 2014-08-08: 10:00:00 via INTRAVENOUS

## 2014-08-08 MED ORDER — DEXAMETHASONE SODIUM PHOSPHATE 20 MG/5ML IJ SOLN
20.0000 mg | Freq: Once | INTRAMUSCULAR | Status: AC
  Administered 2014-08-08: 20 mg via INTRAVENOUS

## 2014-08-08 MED ORDER — FAMOTIDINE IN NACL 20-0.9 MG/50ML-% IV SOLN
INTRAVENOUS | Status: AC
  Filled 2014-08-08: qty 50

## 2014-08-08 MED ORDER — SODIUM CHLORIDE 0.9 % IV SOLN
522.0000 mg | Freq: Once | INTRAVENOUS | Status: AC
  Administered 2014-08-08: 520 mg via INTRAVENOUS
  Filled 2014-08-08: qty 52

## 2014-08-08 MED ORDER — ONDANSETRON 16 MG/50ML IVPB (CHCC)
INTRAVENOUS | Status: AC
  Filled 2014-08-08: qty 16

## 2014-08-08 MED ORDER — INSULIN REGULAR HUMAN 100 UNIT/ML IJ SOLN
4.0000 [IU] | Freq: Once | INTRAMUSCULAR | Status: AC
  Administered 2014-08-08: 10:00:00 via SUBCUTANEOUS
  Filled 2014-08-08: qty 0.04

## 2014-08-08 MED ORDER — DIPHENHYDRAMINE HCL 50 MG/ML IJ SOLN
INTRAMUSCULAR | Status: AC
  Filled 2014-08-08: qty 1

## 2014-08-08 MED ORDER — DEXAMETHASONE SODIUM PHOSPHATE 20 MG/5ML IJ SOLN
INTRAMUSCULAR | Status: AC
  Filled 2014-08-08: qty 5

## 2014-08-08 MED ORDER — DIPHENHYDRAMINE HCL 50 MG/ML IJ SOLN
50.0000 mg | Freq: Once | INTRAMUSCULAR | Status: AC
  Administered 2014-08-08: 50 mg via INTRAVENOUS

## 2014-08-08 MED ORDER — FAMOTIDINE IN NACL 20-0.9 MG/50ML-% IV SOLN
20.0000 mg | Freq: Once | INTRAVENOUS | Status: AC
  Administered 2014-08-08: 20 mg via INTRAVENOUS

## 2014-08-08 MED ORDER — ONDANSETRON 16 MG/50ML IVPB (CHCC)
16.0000 mg | Freq: Once | INTRAVENOUS | Status: AC
  Administered 2014-08-08: 16 mg via INTRAVENOUS

## 2014-08-08 MED ORDER — PACLITAXEL CHEMO INJECTION 300 MG/50ML
80.0000 mg/m2 | Freq: Once | INTRAVENOUS | Status: AC
  Administered 2014-08-08: 138 mg via INTRAVENOUS
  Filled 2014-08-08: qty 23

## 2014-08-08 NOTE — Patient Instructions (Addendum)
[facility] Discharge Instructions for Patients Receiving Chemotherapy  Today you received the following chemotherapy agents: Taxol/Carbopplatin  To help prevent nausea and vomiting after your treatment, we encourage you to take your nausea medication: Zofran 1-2 tab every 12 hr as needed, Phenergan 25mg  3 times daily as needed3  If you develop nausea and vomiting that is not controlled by your nausea medication, call the clinic.  BELOW ARE SYMPTOMS THAT SHOULD BE REPORTED IMMEDIATELY:  *FEVER GREATER THAN 100.5 F  *CHILLS WITH OR WITHOUT FEVER  NAUSEA AND VOMITING THAT IS NOT CONTROLLED WITH YOUR NAUSEA MEDICATION  *UNUSUAL SHORTNESS OF BREATH  *UNUSUAL BRUISING OR BLEEDING  TENDERNESS IN MOUTH AND THROAT WITH OR WITHOUT PRESENCE OF ULCERS  *URINARY PROBLEMS  *BOWEL PROBLEMS  UNUSUAL RASH Items with * indicate a potential emergency and should be followed up as soon as possible.  Feel free to call the clinic you have any questions or concerns. The clinic phone number is [phone].  Paclitaxel injection (Taxol) What is this medicine? PACLITAXEL (PAK li TAX el) is a chemotherapy drug. It targets fast dividing cells, like cancer cells, and causes these cells to die. This medicine is used to treat ovarian cancer, breast cancer, and other cancers. This medicine may be used for other purposes; ask your health care provider or pharmacist if you have questions. COMMON BRAND NAME(S): Onxol, Taxol What should I tell my health care provider before I take this medicine? They need to know if you have any of these conditions: -blood disorders -irregular heartbeat -infection (especially a virus infection such as chickenpox, cold sores, or herpes) -liver disease -previous or ongoing radiation therapy -an unusual or allergic reaction to paclitaxel, alcohol, polyoxyethylated castor oil, other chemotherapy agents, other medicines, foods, dyes, or preservatives -pregnant  or trying to get pregnant -breast-feeding How should I use this medicine? This drug is given as an infusion into a vein. It is administered in a hospital or clinic by a specially trained health care professional. Talk to your pediatrician regarding the use of this medicine in children. Special care may be needed. Overdosage: If you think you have taken too much of this medicine contact a poison control center or emergency room at once. NOTE: This medicine is only for you. Do not share this medicine with others. What if I miss a dose? It is important not to miss your dose. Call your doctor or health care professional if you are unable to keep an appointment. What may interact with this medicine? Do not take this medicine with any of the following medications: -disulfiram -metronidazole This medicine may also interact with the following medications: -cyclosporine -diazepam -ketoconazole -medicines to increase blood counts like filgrastim, pegfilgrastim, sargramostim -other chemotherapy drugs like cisplatin, doxorubicin, epirubicin, etoposide, teniposide, vincristine -quinidine -testosterone -vaccines -verapamil Talk to your doctor or health care professional before taking any of these medicines: -acetaminophen -aspirin -ibuprofen -ketoprofen -naproxen This list may not describe all possible interactions. Give your health care provider a list of all the medicines, herbs, non-prescription drugs, or dietary supplements you use. Also tell them if you smoke, drink alcohol, or use illegal drugs. Some items may interact with your medicine. What should I watch for while using this medicine? Your condition will be monitored carefully while you are receiving this medicine. You will need important blood work done while you are taking this medicine. This drug may make you feel generally unwell. This is not uncommon, as chemotherapy can affect healthy cells as well as  cancer cells. Report any side  effects. Continue your course of treatment even though you feel ill unless your doctor tells you to stop. In some cases, you may be given additional medicines to help with side effects. Follow all directions for their use. Call your doctor or health care professional for advice if you get a fever, chills or sore throat, or other symptoms of a cold or flu. Do not treat yourself. This drug decreases your body's ability to fight infections. Try to avoid being around people who are sick. This medicine may increase your risk to bruise or bleed. Call your doctor or health care professional if you notice any unusual bleeding. Be careful brushing and flossing your teeth or using a toothpick because you may get an infection or bleed more easily. If you have any dental work done, tell your dentist you are receiving this medicine. Avoid taking products that contain aspirin, acetaminophen, ibuprofen, naproxen, or ketoprofen unless instructed by your doctor. These medicines may hide a fever. Do not become pregnant while taking this medicine. Women should inform their doctor if they wish to become pregnant or think they might be pregnant. There is a potential for serious side effects to an unborn child. Talk to your health care professional or pharmacist for more information. Do not breast-feed an infant while taking this medicine. Men are advised not to father a child while receiving this medicine. What side effects may I notice from receiving this medicine? Side effects that you should report to your doctor or health care professional as soon as possible: -allergic reactions like skin rash, itching or hives, swelling of the face, lips, or tongue -low blood counts - This drug may decrease the number of white blood cells, red blood cells and platelets. You may be at increased risk for infections and bleeding. -signs of infection - fever or chills, cough, sore throat, pain or difficulty passing urine -signs of  decreased platelets or bleeding - bruising, pinpoint red spots on the skin, black, tarry stools, nosebleeds -signs of decreased red blood cells - unusually weak or tired, fainting spells, lightheadedness -breathing problems -chest pain -high or low blood pressure -mouth sores -nausea and vomiting -pain, swelling, redness or irritation at the injection site -pain, tingling, numbness in the hands or feet -slow or irregular heartbeat -swelling of the ankle, feet, hands Side effects that usually do not require medical attention (report to your doctor or health care professional if they continue or are bothersome): -bone pain -complete hair loss including hair on your head, underarms, pubic hair, eyebrows, and eyelashes -changes in the color of fingernails -diarrhea -loosening of the fingernails -loss of appetite -muscle or joint pain -red flush to skin -sweating This list may not describe all possible side effects. Call your doctor for medical advice about side effects. You may report side effects to FDA at 1-800-FDA-1088. Where should I keep my medicine? This drug is given in a hospital or clinic and will not be stored at home. NOTE: This sheet is a summary. It may not cover all possible information. If you have questions about this medicine, talk to your doctor, pharmacist, or health care provider.  2015, Elsevier/Gold Standard. (2013-01-21 16:41:21)   Carboplatin injection What is this medicine? CARBOPLATIN (KAR boe pla tin) is a chemotherapy drug. It targets fast dividing cells, like cancer cells, and causes these cells to die. This medicine is used to treat ovarian cancer and many other cancers. This medicine may be used for other purposes; ask  your health care provider or pharmacist if you have questions. COMMON BRAND NAME(S): Paraplatin What should I tell my health care provider before I take this medicine? They need to know if you have any of these conditions: -blood  disorders -hearing problems -kidney disease -recent or ongoing radiation therapy -an unusual or allergic reaction to carboplatin, cisplatin, other chemotherapy, other medicines, foods, dyes, or preservatives -pregnant or trying to get pregnant -breast-feeding How should I use this medicine? This drug is usually given as an infusion into a vein. It is administered in a hospital or clinic by a specially trained health care professional. Talk to your pediatrician regarding the use of this medicine in children. Special care may be needed. Overdosage: If you think you have taken too much of this medicine contact a poison control center or emergency room at once. NOTE: This medicine is only for you. Do not share this medicine with others. What if I miss a dose? It is important not to miss a dose. Call your doctor or health care professional if you are unable to keep an appointment. What may interact with this medicine? -medicines for seizures -medicines to increase blood counts like filgrastim, pegfilgrastim, sargramostim -some antibiotics like amikacin, gentamicin, neomycin, streptomycin, tobramycin -vaccines Talk to your doctor or health care professional before taking any of these medicines: -acetaminophen -aspirin -ibuprofen -ketoprofen -naproxen This list may not describe all possible interactions. Give your health care provider a list of all the medicines, herbs, non-prescription drugs, or dietary supplements you use. Also tell them if you smoke, drink alcohol, or use illegal drugs. Some items may interact with your medicine. What should I watch for while using this medicine? Your condition will be monitored carefully while you are receiving this medicine. You will need important blood work done while you are taking this medicine. This drug may make you feel generally unwell. This is not uncommon, as chemotherapy can affect healthy cells as well as cancer cells. Report any side effects.  Continue your course of treatment even though you feel ill unless your doctor tells you to stop. In some cases, you may be given additional medicines to help with side effects. Follow all directions for their use. Call your doctor or health care professional for advice if you get a fever, chills or sore throat, or other symptoms of a cold or flu. Do not treat yourself. This drug decreases your body's ability to fight infections. Try to avoid being around people who are sick. This medicine may increase your risk to bruise or bleed. Call your doctor or health care professional if you notice any unusual bleeding. Be careful brushing and flossing your teeth or using a toothpick because you may get an infection or bleed more easily. If you have any dental work done, tell your dentist you are receiving this medicine. Avoid taking products that contain aspirin, acetaminophen, ibuprofen, naproxen, or ketoprofen unless instructed by your doctor. These medicines may hide a fever. Do not become pregnant while taking this medicine. Women should inform their doctor if they wish to become pregnant or think they might be pregnant. There is a potential for serious side effects to an unborn child. Talk to your health care professional or pharmacist for more information. Do not breast-feed an infant while taking this medicine. What side effects may I notice from receiving this medicine? Side effects that you should report to your doctor or health care professional as soon as possible: -allergic reactions like skin rash, itching or hives, swelling  of the face, lips, or tongue -signs of infection - fever or chills, cough, sore throat, pain or difficulty passing urine -signs of decreased platelets or bleeding - bruising, pinpoint red spots on the skin, black, tarry stools, nosebleeds -signs of decreased red blood cells - unusually weak or tired, fainting spells, lightheadedness -breathing problems -changes in  hearing -changes in vision -chest pain -high blood pressure -low blood counts - This drug may decrease the number of white blood cells, red blood cells and platelets. You may be at increased risk for infections and bleeding. -nausea and vomiting -pain, swelling, redness or irritation at the injection site -pain, tingling, numbness in the hands or feet -problems with balance, talking, walking -trouble passing urine or change in the amount of urine Side effects that usually do not require medical attention (report to your doctor or health care professional if they continue or are bothersome): -hair loss -loss of appetite -metallic taste in the mouth or changes in taste This list may not describe all possible side effects. Call your doctor for medical advice about side effects. You may report side effects to FDA at 1-800-FDA-1088. Where should I keep my medicine? This drug is given in a hospital or clinic and will not be stored at home. NOTE: This sheet is a summary. It may not cover all possible information. If you have questions about this medicine, talk to your doctor, pharmacist, or health care provider.  2015, Elsevier/Gold Standard. (2008-03-04 14:38:05)

## 2014-08-08 NOTE — Progress Notes (Signed)
1400- Patient completed infusion without difficulty. AVS/appts explained to patient. No questions or concerns at discharge. Patient received 1L of fluid today, 500 mL prior to carboplatin per MD order.

## 2014-08-09 ENCOUNTER — Other Ambulatory Visit: Payer: Self-pay | Admitting: Oncology

## 2014-08-09 DIAGNOSIS — C561 Malignant neoplasm of right ovary: Secondary | ICD-10-CM

## 2014-08-11 ENCOUNTER — Ambulatory Visit: Payer: Managed Care, Other (non HMO)

## 2014-08-11 NOTE — Progress Notes (Signed)
Spoke with pt today.  Pt was scheduled for IVF at 3pm.  Pt did not come in.  Pt stated she called early this am at 7am to cancel appt.  Pt stated she did not feel like she needed IVF at all; pt was able to drink lots of fluids at home - at least 64 oz. Confirmed with pt appts to see Dr. Marko Plume on 9/2 , and chemo on 9/4.

## 2014-08-12 ENCOUNTER — Telehealth: Payer: Self-pay | Admitting: *Deleted

## 2014-08-12 NOTE — Telephone Encounter (Signed)
Called [facility] for chemotherapy F/U.  Patient is doing well.  Denies n/v.  Denies any new side effects or symptoms.  Reports she "already have a problem with numbness and I've discussed this with the Dr., No changes with this".  Bowel and bladder is functioning well.  "No appetite but I make myself eat."  Eating and drinking well.  Instructed to drink 64 oz minimum daily or at least the day before, of and after treatment.  Denies questions at this time and encouraged to call if needed.  Reviewed how to call after hours in the case of an emergency.  F/U visit with provider tomorrow.

## 2014-08-12 NOTE — Telephone Encounter (Signed)
Message copied by Cherylynn Ridges on Tue Aug 12, 2014 10:26 AM ------      Message from: Adalberto Cole      Created: Fri Aug 08, 2014 11:44 AM      Regarding: new chemo      Contact: [phone]       1st time taxol/carboplatin ------

## 2014-08-13 ENCOUNTER — Ambulatory Visit (HOSPITAL_BASED_OUTPATIENT_CLINIC_OR_DEPARTMENT_OTHER): Payer: Managed Care, Other (non HMO) | Admitting: Oncology

## 2014-08-13 ENCOUNTER — Encounter: Payer: Self-pay | Admitting: Oncology

## 2014-08-13 ENCOUNTER — Other Ambulatory Visit (HOSPITAL_BASED_OUTPATIENT_CLINIC_OR_DEPARTMENT_OTHER): Payer: Managed Care, Other (non HMO)

## 2014-08-13 ENCOUNTER — Ambulatory Visit (HOSPITAL_BASED_OUTPATIENT_CLINIC_OR_DEPARTMENT_OTHER): Payer: Managed Care, Other (non HMO)

## 2014-08-13 ENCOUNTER — Encounter: Payer: Self-pay | Admitting: *Deleted

## 2014-08-13 ENCOUNTER — Other Ambulatory Visit: Payer: Self-pay | Admitting: *Deleted

## 2014-08-13 VITALS — BP 127/68 | HR 89 | Temp 97.8°F | Resp 18

## 2014-08-13 VITALS — BP 121/70 | HR 102 | Temp 97.6°F | Resp 16 | Ht 62.0 in | Wt 144.0 lb

## 2014-08-13 DIAGNOSIS — B373 Candidiasis of vulva and vagina: Secondary | ICD-10-CM

## 2014-08-13 DIAGNOSIS — G609 Hereditary and idiopathic neuropathy, unspecified: Secondary | ICD-10-CM

## 2014-08-13 DIAGNOSIS — C569 Malignant neoplasm of unspecified ovary: Secondary | ICD-10-CM

## 2014-08-13 DIAGNOSIS — C561 Malignant neoplasm of right ovary: Secondary | ICD-10-CM

## 2014-08-13 DIAGNOSIS — C786 Secondary malignant neoplasm of retroperitoneum and peritoneum: Secondary | ICD-10-CM

## 2014-08-13 DIAGNOSIS — B3731 Acute candidiasis of vulva and vagina: Secondary | ICD-10-CM

## 2014-08-13 DIAGNOSIS — E119 Type 2 diabetes mellitus without complications: Secondary | ICD-10-CM

## 2014-08-13 LAB — CBC WITH DIFFERENTIAL/PLATELET
BASO%: 1 % (ref 0.0–2.0)
Basophils Absolute: 0.1 10*3/uL (ref 0.0–0.1)
EOS%: 0.8 % (ref 0.0–7.0)
Eosinophils Absolute: 0.1 10*3/uL (ref 0.0–0.5)
HCT: 34.8 % (ref 34.8–46.6)
HGB: 11.2 g/dL — ABNORMAL LOW (ref 11.6–15.9)
LYMPH#: 2 10*3/uL (ref 0.9–3.3)
LYMPH%: 29.7 % (ref 14.0–49.7)
MCH: 27.6 pg (ref 25.1–34.0)
MCHC: 32.2 g/dL (ref 31.5–36.0)
MCV: 85.6 fL (ref 79.5–101.0)
MONO#: 0.2 10*3/uL (ref 0.1–0.9)
MONO%: 3 % (ref 0.0–14.0)
NEUT#: 4.5 10*3/uL (ref 1.5–6.5)
NEUT%: 65.5 % (ref 38.4–76.8)
Platelets: 239 10*3/uL (ref 145–400)
RBC: 4.07 10*6/uL (ref 3.70–5.45)
RDW: 13.5 % (ref 11.2–14.5)
WBC: 6.8 10*3/uL (ref 3.9–10.3)

## 2014-08-13 LAB — COMPREHENSIVE METABOLIC PANEL (CC13)
ALT: 13 U/L (ref 0–55)
AST: 12 U/L (ref 5–34)
Albumin: 3.2 g/dL — ABNORMAL LOW (ref 3.5–5.0)
Alkaline Phosphatase: 60 U/L (ref 40–150)
Anion Gap: 10 mEq/L (ref 3–11)
BUN: 10.1 mg/dL (ref 7.0–26.0)
CO2: 25 mEq/L (ref 22–29)
Calcium: 9.6 mg/dL (ref 8.4–10.4)
Chloride: 97 mEq/L — ABNORMAL LOW (ref 98–109)
Creatinine: 0.7 mg/dL (ref 0.6–1.1)
Glucose: 185 mg/dl — ABNORMAL HIGH (ref 70–140)
POTASSIUM: 4.4 meq/L (ref 3.5–5.1)
SODIUM: 133 meq/L — AB (ref 136–145)
TOTAL PROTEIN: 6.6 g/dL (ref 6.4–8.3)
Total Bilirubin: 0.67 mg/dL (ref 0.20–1.20)

## 2014-08-13 MED ORDER — ONDANSETRON 16 MG/50ML IVPB (CHCC)
16.0000 mg | Freq: Once | INTRAVENOUS | Status: AC
  Administered 2014-08-13: 16 mg via INTRAVENOUS

## 2014-08-13 MED ORDER — OXYCODONE HCL 5 MG PO TABS: 5.0000 mg | ORAL_TABLET | ORAL | Status: DC | PRN

## 2014-08-13 MED ORDER — PROMETHAZINE HCL 25 MG PO TABS: 25.0000 mg | ORAL_TABLET | Freq: Three times a day (TID) | ORAL | Status: DC | PRN

## 2014-08-13 MED ORDER — HYDROMORPHONE HCL PF 4 MG/ML IJ SOLN
INTRAMUSCULAR | Status: AC
  Filled 2014-08-13: qty 1

## 2014-08-13 MED ORDER — FAMOTIDINE IN NACL 20-0.9 MG/50ML-% IV SOLN
20.0000 mg | Freq: Once | INTRAVENOUS | Status: AC
  Administered 2014-08-13: 20 mg via INTRAVENOUS

## 2014-08-13 MED ORDER — HYDROMORPHONE HCL PF 4 MG/ML IJ SOLN
1.0000 mg | Freq: Once | INTRAMUSCULAR | Status: AC
  Administered 2014-08-13: 1 mg via INTRAVENOUS

## 2014-08-13 MED ORDER — OXYMORPHONE HCL ER 20 MG PO TB12: 20.0000 mg | ORAL_TABLET | Freq: Two times a day (BID) | ORAL | Status: DC

## 2014-08-13 MED ORDER — SODIUM CHLORIDE 0.9 % IV SOLN
1000.0000 mL | INTRAVENOUS | Status: DC
  Administered 2014-08-13: 1000 mL via INTRAVENOUS

## 2014-08-13 MED ORDER — HYDROMORPHONE HCL PF 4 MG/ML IJ SOLN
1.0000 mg | Freq: Once | INTRAMUSCULAR | Status: AC | PRN
  Administered 2014-08-13: 1 mg via INTRAVENOUS

## 2014-08-13 MED ORDER — ONDANSETRON 16 MG/50ML IVPB (CHCC)
INTRAVENOUS | Status: AC
  Filled 2014-08-13: qty 16

## 2014-08-13 MED ORDER — FAMOTIDINE IN NACL 20-0.9 MG/50ML-% IV SOLN
INTRAVENOUS | Status: AC
  Filled 2014-08-13: qty 50

## 2014-08-13 NOTE — Patient Instructions (Signed)

## 2014-08-13 NOTE — Progress Notes (Signed)
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 She began chemotherapy with dose dense carboplatin and taxol on 08-08-14, received one liter IVF including 500 cc before carboplatin that day and had no problems during infusion.   She was able to eat and drink fluids over next 2 days after chemo and cancelled IVF here on 8-31, but has had nausea and vomiting since then, with little po intake. She has used phenergan ~ 3x daily, ativan at hs and occasional zofran. Pain is still significant from epigastrium to left flank and across low abdomen, despite increase in oxymorphone to 20 mg q 12 hrs and prn oxyIR. Bowels are moving some. She has not checked blood sugars, has continued metformin.   Peripheral IV access has been somewhat difficult. She had not had genetics testing. Foundation One testing in progress.  ONCOLOGIC HISTORY Patient has history of cystitis 2 years ago which presented with suprapubic and back pain, resolved with macrobid x 3 months; CT then was not remarkable. She developed similar symptoms over several months recently and was seen by Dr Nila Nephew with finding of urinary retention. CT AP without contrast at [facility] 06-09-14 showed 9.5 x 14.8 x 10.5 cm complex cystic mass in central pelvis with uterus surgically absent, no adenopathy abdomen or pelvic sidewall, apparent thickening tail of pancreas and question of peripancreatic stranding, 2.5 x 1.8 cm low density structure adjacent to right atrium. She was seen by Dr Ellouise Newer on 06-11-14, with pelvic mass  on exam, CA 125 152 and CEA 3.28 both on 06-11-14. She was seen by Dr Denman George on 06-16-14, at which time she reported 10 lb weight loss in 2 weeks, early satiety, increased constipation and pain; her exam revealed large smooth pelvic mass filling pelvis, adherent to vagina and in close approximation to rectum, minimally mobile. CA 19-9 from 06-16-14 was 224.7. 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 She had exploratory laparotomy with right salpingo-oophorectomy, omentectomy and radical debulking of ovarian cancer by Dr Denman George at [facility] on 07-01-14. Intraoperative findings were of 20 cm cystic mass arising from right ovary, filling pelvis centrally and adherent to sigmoid colon, 200 cc ascites, peritoneal tumor plaques anterior pelvis and right diaphragm with TNTC 1 cm nodules small bowel mesentery and bowel wall; enlarged pancreatic body was described as rigid and abnormally firm to palpation. At completion of surgery there was residual tumor at the mesentery of the small and large intestine, thin tumor plaques on the diaphragm and pelvic peritoneum, and gallbladder serosa and pouch of Randol Kern representing an incomplete and suboptimal cytoreduction.  Pathology [id number] from 07-01-14 showed 16 x 11 x 7 cm mucinous cystadenocarcinoma of right ovary with capsule intact, tumor on surface of ovary and identical tumor involving omentum and peritoneal biopsies, no nodes evaluated, for [id number]/ FIGO IIIB. This pathology was reviewed by 3 pathologists in [facility] system. Patient was stable for DC home on 07-03-14. Within 24 hours after discharge  she developed nausea, vomiting and abdominal pain, requiring readmission from 7-25 thru  07-15-14 for SBO and mild pancreatitis. She was managed conservatively in hospital, followed by Dr Denman George, did require NG decompression and support with TNA via PICC. Repeat CT AP was negative for high grade bowel obstruction or transition point; PICC was removed prior to DC home and diabetes was managed with SS insulin. Lovenox was continued in hospital but was not resumed at DC (initially planned x 4 weeks post op). She had endoscopic [place] with biopsy of mass in tail of pancreas by Dr Paulita Fujita on 07-30-14, path confirming mucinous cystadenocarcinoma involvement there.  Review of systems as above, also: No fever or clear symptoms of infection. Not SOB, no bleeding. No LE swelling. Remainder of 10 point Review of Systems negative.  Objective:  Vital signs in last 24 hours:  BP 121/70  Pulse 102  Temp(Src) 97.6 F (36.4 C) (Oral)  Resp 16  Ht 5\' 2"  (1.575 m)  Wt 144 lb (65.318 kg)  BMI 26.33 kg/m2 weight down 0.5 lb  Alert, oriented and appropriate. Ambulatory without assistance, looks moderately uncomfortable.    HEENT:PERRL, sclerae not icteric. Oral mucosa moist without lesions, posterior pharynx clear.  Neck supple. No JVD.  Lymphatics:no suraclavicular adenopathy Resp: clear to auscultation bilaterally and normal percussion bilaterally Cardio: tachy, regular rate and rhythm. No gallop. GI: soft, not directly tender in epigastrium, not distended, no appreciable mass or organomegaly. Normally active bowel sounds. Surgical incision not remarkable. Musculoskeletal/ Extremities: without pitting edema, cords, tenderness Neuro: no peripheral neuropathy. Otherwise nonfocal. PSYCH appropriate mood and affect Skin without rash, ecchymosis, petechiae  IV therapy RN able to get peripheral IV access today.  Patient seen again in infusion area, somewhat more comfortable with 1 mg IV dilaudid, which was repeated then.  Lab Results:  Results for orders placed in visit on 08/13/14  CBC WITH  DIFFERENTIAL      Result Value Ref Range   WBC 6.8  3.9 - 10.3 10e3/uL   NEUT# 4.5  1.5 - 6.5 10e3/uL   HGB 11.2 (*) 11.6 - 15.9 g/dL   HCT 34.8  34.8 - 46.6 %   Platelets 239  145 - 400 10e3/uL   MCV 85.6  79.5 - 101.0 fL   MCH 27.6  25.1 - 34.0 pg   MCHC 32.2  31.5 - 36.0 g/dL   RBC 4.07  3.70 - 5.45 10e6/uL   RDW 13.5  11.2 - 14.5 %   lymph# 2.0  0.9 - 3.3 10e3/uL   MONO# 0.2  0.1 - 0.9 10e3/uL   Eosinophils Absolute 0.1  0.0 - 0.5 10e3/uL   Basophils Absolute 0.1  0.0 - 0.1 10e3/uL   NEUT% 65.5  38.4 - 76.8 %   LYMPH% 29.7  14.0 - 49.7 %   MONO% 3.0  0.0 - 14.0 %   EOS% 0.8  0.0 - 7.0 %   BASO% 1.0  0.0 - 2.0 %    CMET available following visit with Na 133, Cl 97, glu 185, alb 3.2 otherwise normal including LFTs and bili.  Studies/Results:  No results found.  Medications: I have reviewed the patient's current medications. Will increase oxymorphone to 30 mg every 12 hours, with script for 20 mg tablets to use along with 10 mg tablets already available; prn oxyIR. She is also given total 2 mg IV dilaudid with NS following MD visit, with improvement in upper abdominal pain.  DISCUSSION: patient agrees to have IVF with IV zofran  and IV dilaudid today. Other meds as above. Day 8 cycle 1 taxol on 08-15-14.  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 Dose dense carbo taxol begun  8-28. IVF with other supportive meds today, day 8 cycle 1 taxol on 08-15-14. Increase pain medication as above. Foundation One testing pending. 2. Diabetes: on oral agent prior to this diagnosis, with worsening of Hb A1c and higher blood sugars in past few months. Will need to follow blood sugars particularly with variable po intake and decadron with taxol. 3.SBO postoperatively: resolved with conservative care. Add senna or miralax to stool softeners now.  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  Added prn IV dilaudid and additional IVF to chemo order 08-15-14.   Lashona Schaaf P, MD   08/13/2014, 2:41 PM

## 2014-08-13 NOTE — Progress Notes (Signed)
[place] Psychosocial Distress Screening Clinical Social Work  Clinical Social Work was referred by distress screening protocol.  The patient scored a 7 on the Psychosocial Distress Thermometer which indicates moderate distress. Clinical Social Worker contacted patient by phone to assess for distress and other psychosocial needs.  Ms. Belzer shared she is "doing fine" and plans to discuss some nausea and pain related side effects with Dr. Marko Plume today.  The patient did not elaborate on any emotional/spiritual/practical concerns at this time.  CSW briefly reviewed CSW role and support services at [facility] and encouraged patient to call with any questions or concerns.  ONCBCN DISTRESS SCREENING 08/06/2014  Screening Type Initial Screening  Mark the number that describes how much distress you have been experiencing in the past week 7  Practical problem type Food  Family Problem type (No Data)  Emotional problem type Nervousness/Anxiety  Spiritual/Religous concerns type Loss of sense of purpose  Information Concerns Type (No Data)  Physical Problem type Pain;Nausea/vomiting;Breathing;Loss of appetitie  Physician notified of physical symptoms Yes  Referral to clinical social work Yes    Clinical Social Worker follow up needed: No.  If yes, follow up plan:  Polo Riley, MSW, LCSW, OSW-C Clinical Social Worker [facility] [phone]

## 2014-08-14 ENCOUNTER — Telehealth: Payer: Self-pay | Admitting: *Deleted

## 2014-08-14 NOTE — Telephone Encounter (Signed)
Patient called regarding her appts for tomorrow. I have given her the schedule

## 2014-08-15 ENCOUNTER — Telehealth: Payer: Self-pay

## 2014-08-15 ENCOUNTER — Other Ambulatory Visit (HOSPITAL_BASED_OUTPATIENT_CLINIC_OR_DEPARTMENT_OTHER): Payer: Managed Care, Other (non HMO)

## 2014-08-15 ENCOUNTER — Encounter: Payer: Self-pay | Admitting: *Deleted

## 2014-08-15 ENCOUNTER — Ambulatory Visit (HOSPITAL_BASED_OUTPATIENT_CLINIC_OR_DEPARTMENT_OTHER): Payer: Managed Care, Other (non HMO)

## 2014-08-15 ENCOUNTER — Other Ambulatory Visit: Payer: Self-pay | Admitting: *Deleted

## 2014-08-15 VITALS — BP 129/40 | HR 104 | Temp 98.0°F

## 2014-08-15 DIAGNOSIS — C786 Secondary malignant neoplasm of retroperitoneum and peritoneum: Secondary | ICD-10-CM

## 2014-08-15 DIAGNOSIS — Z5111 Encounter for antineoplastic chemotherapy: Secondary | ICD-10-CM

## 2014-08-15 DIAGNOSIS — C561 Malignant neoplasm of right ovary: Secondary | ICD-10-CM

## 2014-08-15 DIAGNOSIS — C801 Malignant (primary) neoplasm, unspecified: Secondary | ICD-10-CM

## 2014-08-15 LAB — COMPREHENSIVE METABOLIC PANEL (CC13)
ALBUMIN: 3.3 g/dL — AB (ref 3.5–5.0)
ALT: 11 U/L (ref 0–55)
AST: 12 U/L (ref 5–34)
Alkaline Phosphatase: 57 U/L (ref 40–150)
Anion Gap: 9 mEq/L (ref 3–11)
BUN: 9.7 mg/dL (ref 7.0–26.0)
CALCIUM: 9.3 mg/dL (ref 8.4–10.4)
CHLORIDE: 100 meq/L (ref 98–109)
CO2: 24 mEq/L (ref 22–29)
Creatinine: 0.8 mg/dL (ref 0.6–1.1)
Glucose: 160 mg/dl — ABNORMAL HIGH (ref 70–140)
POTASSIUM: 4.2 meq/L (ref 3.5–5.1)
Sodium: 133 mEq/L — ABNORMAL LOW (ref 136–145)
Total Bilirubin: 0.59 mg/dL (ref 0.20–1.20)
Total Protein: 6.4 g/dL (ref 6.4–8.3)

## 2014-08-15 LAB — CBC WITH DIFFERENTIAL/PLATELET
BASO%: 1.4 % (ref 0.0–2.0)
Basophils Absolute: 0.1 10*3/uL (ref 0.0–0.1)
EOS%: 1.4 % (ref 0.0–7.0)
Eosinophils Absolute: 0.1 10*3/uL (ref 0.0–0.5)
HCT: 33.7 % — ABNORMAL LOW (ref 34.8–46.6)
HEMOGLOBIN: 10.7 g/dL — AB (ref 11.6–15.9)
LYMPH#: 1.7 10*3/uL (ref 0.9–3.3)
LYMPH%: 30 % (ref 14.0–49.7)
MCH: 27.4 pg (ref 25.1–34.0)
MCHC: 31.7 g/dL (ref 31.5–36.0)
MCV: 86.4 fL (ref 79.5–101.0)
MONO#: 0.3 10*3/uL (ref 0.1–0.9)
MONO%: 5.3 % (ref 0.0–14.0)
NEUT#: 3.5 10*3/uL (ref 1.5–6.5)
NEUT%: 61.9 % (ref 38.4–76.8)
Platelets: 206 10*3/uL (ref 145–400)
RBC: 3.91 10*6/uL (ref 3.70–5.45)
RDW: 13.5 % (ref 11.2–14.5)
WBC: 5.7 10*3/uL (ref 3.9–10.3)

## 2014-08-15 MED ORDER — INSULIN REGULAR HUMAN 100 UNIT/ML IJ SOLN
2.0000 [IU] | Freq: Once | INTRAMUSCULAR | Status: DC
Start: 2014-08-15 — End: 2014-08-15

## 2014-08-15 MED ORDER — PACLITAXEL CHEMO INJECTION 300 MG/50ML
80.0000 mg/m2 | Freq: Once | INTRAVENOUS | Status: AC
  Administered 2014-08-15: 138 mg via INTRAVENOUS
  Filled 2014-08-15: qty 23

## 2014-08-15 MED ORDER — HYDROMORPHONE HCL PF 4 MG/ML IJ SOLN
1.0000 mg | Freq: Once | INTRAMUSCULAR | Status: AC | PRN
  Administered 2014-08-15: 1 mg via INTRAVENOUS

## 2014-08-15 MED ORDER — ONDANSETRON 8 MG/NS 50 ML IVPB
INTRAVENOUS | Status: AC
  Filled 2014-08-15: qty 8

## 2014-08-15 MED ORDER — DEXAMETHASONE SODIUM PHOSPHATE 20 MG/5ML IJ SOLN
INTRAMUSCULAR | Status: AC
  Filled 2014-08-15: qty 5

## 2014-08-15 MED ORDER — DIPHENHYDRAMINE HCL 50 MG/ML IJ SOLN
50.0000 mg | Freq: Once | INTRAMUSCULAR | Status: AC
  Administered 2014-08-15: 50 mg via INTRAVENOUS

## 2014-08-15 MED ORDER — FAMOTIDINE IN NACL 20-0.9 MG/50ML-% IV SOLN
INTRAVENOUS | Status: AC
  Filled 2014-08-15: qty 50

## 2014-08-15 MED ORDER — DEXAMETHASONE SODIUM PHOSPHATE 20 MG/5ML IJ SOLN
20.0000 mg | Freq: Once | INTRAMUSCULAR | Status: AC
  Administered 2014-08-15: 20 mg via INTRAVENOUS

## 2014-08-15 MED ORDER — HYDROMORPHONE HCL 2 MG PO TABS: 2.0000 mg | ORAL_TABLET | ORAL | Status: DC | PRN

## 2014-08-15 MED ORDER — DIPHENHYDRAMINE HCL 50 MG/ML IJ SOLN
INTRAMUSCULAR | Status: AC
  Filled 2014-08-15: qty 1

## 2014-08-15 MED ORDER — HYDROMORPHONE HCL PF 4 MG/ML IJ SOLN
INTRAMUSCULAR | Status: AC
  Filled 2014-08-15: qty 1

## 2014-08-15 MED ORDER — FAMOTIDINE IN NACL 20-0.9 MG/50ML-% IV SOLN
20.0000 mg | Freq: Once | INTRAVENOUS | Status: AC
  Administered 2014-08-15: 20 mg via INTRAVENOUS

## 2014-08-15 MED ORDER — LORAZEPAM 0.5 MG PO TABS: ORAL_TABLET | ORAL | Status: DC

## 2014-08-15 MED ORDER — SODIUM CHLORIDE 0.9 % IV SOLN
Freq: Once | INTRAVENOUS | Status: AC
  Administered 2014-08-15: 12:00:00 via INTRAVENOUS

## 2014-08-15 MED ORDER — ONDANSETRON 8 MG/50ML IVPB (CHCC)
8.0000 mg | Freq: Once | INTRAVENOUS | Status: AC
  Administered 2014-08-15: 8 mg via INTRAVENOUS

## 2014-08-15 NOTE — Patient Instructions (Signed)
[facility] Discharge Instructions for Patients Receiving Chemotherapy  Today you received the following chemotherapy agents taxol  To help prevent nausea and vomiting after your treatment, we encourage you to take your nausea medication as directed   If you develop nausea and vomiting that is not controlled by your nausea medication, call the clinic.   BELOW ARE SYMPTOMS THAT SHOULD BE REPORTED IMMEDIATELY:  *FEVER GREATER THAN 100.5 F  *CHILLS WITH OR WITHOUT FEVER  NAUSEA AND VOMITING THAT IS NOT CONTROLLED WITH YOUR NAUSEA MEDICATION  *UNUSUAL SHORTNESS OF BREATH  *UNUSUAL BRUISING OR BLEEDING  TENDERNESS IN MOUTH AND THROAT WITH OR WITHOUT PRESENCE OF ULCERS  *URINARY PROBLEMS  *BOWEL PROBLEMS  UNUSUAL RASH Items with * indicate a potential emergency and should be followed up as soon as possible.  Feel free to call the clinic you have any questions or concerns. The clinic phone number is [phone].  

## 2014-08-15 NOTE — Telephone Encounter (Signed)
Message copied by Baruch Merl on Fri Aug 15, 2014  5:46 PM ------      Message from: Evlyn Clines P      Created: Thu Aug 14, 2014  9:17 PM       #2 message on this one      She probably needs PAC - Robin had to start her on 9-2.  Fine to set it up if she agrees ------

## 2014-08-15 NOTE — Telephone Encounter (Signed)
Ms. Nanez stated that she was more comfortable 9-2;9-3 with increased Oxymorphone to 30 mg q 12 hours.  The Oxycodone 5 mg tabs 2 tabs are  being used 4 time a day.  She feels that the oral dilaudid would be more helpful.  Prescription given for Dilaudid 2 mg tabs with directions as noted below by Dr. Marko Plume. Told Ms. Chagoya that Dr. Marko Plume said that she could increase the Oxymorphone to 40 mg q 12 if she is not more comfortable using the dilaudid for breakthrough pain this weekend.  Pt. Verbalized understanding.

## 2014-08-15 NOTE — Telephone Encounter (Signed)
Kristin Meadows is fine to have a PAC placed.  Told her that it may not occur prior to 08-22-14 treatment due to the Labor Day Holiday but will be ordered and she should receive a call from [facility] with date, time , and instructions.

## 2014-08-15 NOTE — Progress Notes (Signed)
[facility] Clinical Social Work  Clinical Social Work was referred by patient's husband for assistance with ADRs and HCPOA paperwork.  Clinical Social Worker met with patient and husband in the chemo room to offer support and assess for needs.  CSW provided them with ADR packets and reviewed this with them. Pt and husband shared some financial concerns related to her cancer treatment and caring for husband's mother who also is receiving cancer treatment currently. CSW discussed resources that may assist with some of their financial issues. Husband plans to call [facility] later today.  CSW not able to secure volunteer witnesses today. Pt and husband plan to return next Friday to complete ADRs prior to chemo and also discuss finances in more detail.   Clinical Social Work interventions: Emotional support ADR education Resource education  Grier Hock, LCSW Clinical Social Worker [facility]  [facility] Phone: [phone] Fax: [phone]         

## 2014-08-15 NOTE — Telephone Encounter (Signed)
          Patient Demographics     Patient Name Sex DOB SSN Address Phone    Kristin Meadows, Kristin Meadows Female September 20, 1956 [id number]  [place] [address] [phone] (Home) [phone] (Mobile)              Message Received: 1 day ago     Gordy Levan, MD Baruch Merl, RN; Christa See, RN            For chemo on 9-4.  IV dilaudid on 9-2 helped pain. Please see if she needs prescription for po dilaudid, 2 mg 1-2 tabs q 4 hr prn # 30 if so.  I have put sign and hold order for IV dilaudid with Rx 9-4 if needed.   thanks

## 2014-08-18 ENCOUNTER — Other Ambulatory Visit: Payer: Self-pay | Admitting: Oncology

## 2014-08-18 DIAGNOSIS — C259 Malignant neoplasm of pancreas, unspecified: Secondary | ICD-10-CM | POA: Insufficient documentation

## 2014-08-18 DIAGNOSIS — C561 Malignant neoplasm of right ovary: Secondary | ICD-10-CM

## 2014-08-18 DIAGNOSIS — C252 Malignant neoplasm of tail of pancreas: Secondary | ICD-10-CM

## 2014-08-19 ENCOUNTER — Telehealth: Payer: Self-pay | Admitting: Oncology

## 2014-08-19 NOTE — Telephone Encounter (Signed)
s.w. pt and advised on SEpt and OCT appt....pt will pick up new sched friday at visit...pt ok and aware

## 2014-08-20 ENCOUNTER — Other Ambulatory Visit: Payer: Self-pay

## 2014-08-20 ENCOUNTER — Encounter (HOSPITAL_COMMUNITY): Payer: Self-pay | Admitting: Pharmacy Technician

## 2014-08-20 DIAGNOSIS — B373 Candidiasis of vulva and vagina: Secondary | ICD-10-CM

## 2014-08-20 DIAGNOSIS — C561 Malignant neoplasm of right ovary: Secondary | ICD-10-CM

## 2014-08-20 DIAGNOSIS — B3731 Acute candidiasis of vulva and vagina: Secondary | ICD-10-CM

## 2014-08-20 MED ORDER — OXYMORPHONE HCL ER 10 MG PO TB12: ORAL_TABLET | ORAL | Status: DC

## 2014-08-20 MED ORDER — DEXAMETHASONE 4 MG PO TABS: ORAL_TABLET | ORAL | Status: DC

## 2014-08-20 NOTE — Progress Notes (Signed)
Ms. Kristin Meadows's pain is 1-2/10 with Opanda ER 30 mg with Oxycodone 5 mg for breakthrough pain.  She has only needed to use 1 Dilaudid 2 mg tab since 08-15-14

## 2014-08-22 ENCOUNTER — Other Ambulatory Visit: Payer: Self-pay | Admitting: Radiology

## 2014-08-22 ENCOUNTER — Encounter: Payer: Self-pay | Admitting: Oncology

## 2014-08-22 ENCOUNTER — Encounter (HOSPITAL_COMMUNITY): Payer: Self-pay

## 2014-08-22 ENCOUNTER — Telehealth: Payer: Self-pay | Admitting: Oncology

## 2014-08-22 ENCOUNTER — Ambulatory Visit (HOSPITAL_BASED_OUTPATIENT_CLINIC_OR_DEPARTMENT_OTHER): Payer: Managed Care, Other (non HMO) | Admitting: Oncology

## 2014-08-22 ENCOUNTER — Other Ambulatory Visit (HOSPITAL_BASED_OUTPATIENT_CLINIC_OR_DEPARTMENT_OTHER): Payer: Managed Care, Other (non HMO)

## 2014-08-22 ENCOUNTER — Ambulatory Visit (HOSPITAL_BASED_OUTPATIENT_CLINIC_OR_DEPARTMENT_OTHER): Payer: Managed Care, Other (non HMO)

## 2014-08-22 VITALS — BP 134/71 | HR 103 | Temp 98.4°F | Resp 20 | Ht 62.0 in | Wt 146.8 lb

## 2014-08-22 DIAGNOSIS — C569 Malignant neoplasm of unspecified ovary: Secondary | ICD-10-CM

## 2014-08-22 DIAGNOSIS — Z5111 Encounter for antineoplastic chemotherapy: Secondary | ICD-10-CM

## 2014-08-22 DIAGNOSIS — C561 Malignant neoplasm of right ovary: Secondary | ICD-10-CM

## 2014-08-22 DIAGNOSIS — E119 Type 2 diabetes mellitus without complications: Secondary | ICD-10-CM

## 2014-08-22 DIAGNOSIS — I10 Essential (primary) hypertension: Secondary | ICD-10-CM

## 2014-08-22 LAB — CBC WITH DIFFERENTIAL/PLATELET
BASO%: 0.3 % (ref 0.0–2.0)
Basophils Absolute: 0 10*3/uL (ref 0.0–0.1)
EOS ABS: 0 10*3/uL (ref 0.0–0.5)
EOS%: 0.2 % (ref 0.0–7.0)
HCT: 32.6 % — ABNORMAL LOW (ref 34.8–46.6)
HGB: 10.6 g/dL — ABNORMAL LOW (ref 11.6–15.9)
LYMPH%: 32.3 % (ref 14.0–49.7)
MCH: 27.9 pg (ref 25.1–34.0)
MCHC: 32.4 g/dL (ref 31.5–36.0)
MCV: 86.2 fL (ref 79.5–101.0)
MONO#: 0 10*3/uL — ABNORMAL LOW (ref 0.1–0.9)
MONO%: 1.6 % (ref 0.0–14.0)
NEUT#: 1.3 10*3/uL — ABNORMAL LOW (ref 1.5–6.5)
NEUT%: 65.6 % (ref 38.4–76.8)
PLATELETS: 158 10*3/uL (ref 145–400)
RBC: 3.78 10*6/uL (ref 3.70–5.45)
RDW: 14.2 % (ref 11.2–14.5)
WBC: 2 10*3/uL — ABNORMAL LOW (ref 3.9–10.3)
lymph#: 0.6 10*3/uL — ABNORMAL LOW (ref 0.9–3.3)

## 2014-08-22 LAB — COMPREHENSIVE METABOLIC PANEL (CC13)
ALT: 13 U/L (ref 0–55)
AST: 10 U/L (ref 5–34)
Albumin: 3.1 g/dL — ABNORMAL LOW (ref 3.5–5.0)
Alkaline Phosphatase: 76 U/L (ref 40–150)
Anion Gap: 11 mEq/L (ref 3–11)
BUN: 9.1 mg/dL (ref 7.0–26.0)
CO2: 23 mEq/L (ref 22–29)
Calcium: 9.6 mg/dL (ref 8.4–10.4)
Chloride: 100 mEq/L (ref 98–109)
Creatinine: 0.7 mg/dL (ref 0.6–1.1)
GLUCOSE: 254 mg/dL — AB (ref 70–140)
Potassium: 4.8 mEq/L (ref 3.5–5.1)
Sodium: 134 mEq/L — ABNORMAL LOW (ref 136–145)
TOTAL PROTEIN: 6.7 g/dL (ref 6.4–8.3)
Total Bilirubin: 0.59 mg/dL (ref 0.20–1.20)

## 2014-08-22 LAB — WHOLE BLOOD GLUCOSE
GLUCOSE: 235 mg/dL — AB (ref 70–100)
HRS PC: 0.5 h

## 2014-08-22 MED ORDER — FAMOTIDINE IN NACL 20-0.9 MG/50ML-% IV SOLN
INTRAVENOUS | Status: AC
  Filled 2014-08-22: qty 50

## 2014-08-22 MED ORDER — FAMOTIDINE IN NACL 20-0.9 MG/50ML-% IV SOLN
20.0000 mg | Freq: Once | INTRAVENOUS | Status: AC
  Administered 2014-08-22: 20 mg via INTRAVENOUS

## 2014-08-22 MED ORDER — DEXAMETHASONE SODIUM PHOSPHATE 20 MG/5ML IJ SOLN
INTRAMUSCULAR | Status: AC
  Filled 2014-08-22: qty 5

## 2014-08-22 MED ORDER — DIPHENHYDRAMINE HCL 50 MG/ML IJ SOLN
INTRAMUSCULAR | Status: AC
  Filled 2014-08-22: qty 1

## 2014-08-22 MED ORDER — DEXAMETHASONE SODIUM PHOSPHATE 20 MG/5ML IJ SOLN
20.0000 mg | Freq: Once | INTRAMUSCULAR | Status: AC
  Administered 2014-08-22: 20 mg via INTRAVENOUS

## 2014-08-22 MED ORDER — ONDANSETRON 8 MG/50ML IVPB (CHCC)
8.0000 mg | Freq: Once | INTRAVENOUS | Status: AC
  Administered 2014-08-22: 8 mg via INTRAVENOUS

## 2014-08-22 MED ORDER — HYDROMORPHONE HCL PF 4 MG/ML IJ SOLN
INTRAMUSCULAR | Status: AC
  Filled 2014-08-22: qty 1

## 2014-08-22 MED ORDER — SODIUM CHLORIDE 0.9 % IV SOLN
Freq: Once | INTRAVENOUS | Status: AC
  Administered 2014-08-22: 11:00:00 via INTRAVENOUS

## 2014-08-22 MED ORDER — DIPHENHYDRAMINE HCL 50 MG/ML IJ SOLN
50.0000 mg | Freq: Once | INTRAMUSCULAR | Status: AC
  Administered 2014-08-22: 50 mg via INTRAVENOUS

## 2014-08-22 MED ORDER — FILGRASTIM 300 MCG/0.5ML IJ SOLN: 300.0000 ug | Freq: Once | INTRAMUSCULAR | Status: DC

## 2014-08-22 MED ORDER — HYDROMORPHONE HCL PF 4 MG/ML IJ SOLN
1.0000 mg | Freq: Once | INTRAMUSCULAR | Status: AC | PRN
  Administered 2014-08-22: 2 mg via INTRAVENOUS

## 2014-08-22 MED ORDER — INSULIN REGULAR HUMAN 100 UNIT/ML IJ SOLN
6.0000 [IU] | Freq: Once | INTRAMUSCULAR | Status: AC
  Administered 2014-08-22: 4 [IU] via SUBCUTANEOUS
  Filled 2014-08-22: qty 0.06

## 2014-08-22 MED ORDER — ONDANSETRON 8 MG/NS 50 ML IVPB
INTRAVENOUS | Status: AC
  Filled 2014-08-22: qty 8

## 2014-08-22 MED ORDER — PACLITAXEL CHEMO INJECTION 300 MG/50ML
80.0000 mg/m2 | Freq: Once | INTRAVENOUS | Status: AC
  Administered 2014-08-22: 138 mg via INTRAVENOUS
  Filled 2014-08-22: qty 23

## 2014-08-22 NOTE — Patient Instructions (Signed)
[facility] Discharge Instructions for Patients Receiving Chemotherapy  Today you received the following chemotherapy agents: Taxol.  To help prevent nausea and vomiting after your treatment, we encourage you to take your nausea medication as prescribed.   If you develop nausea and vomiting that is not controlled by your nausea medication, call the clinic.   BELOW ARE SYMPTOMS THAT SHOULD BE REPORTED IMMEDIATELY:  *FEVER GREATER THAN 100.5 F  *CHILLS WITH OR WITHOUT FEVER  NAUSEA AND VOMITING THAT IS NOT CONTROLLED WITH YOUR NAUSEA MEDICATION  *UNUSUAL SHORTNESS OF BREATH  *UNUSUAL BRUISING OR BLEEDING  TENDERNESS IN MOUTH AND THROAT WITH OR WITHOUT PRESENCE OF ULCERS  *URINARY PROBLEMS  *BOWEL PROBLEMS  UNUSUAL RASH Items with * indicate a potential emergency and should be followed up as soon as possible.  Feel free to call the clinic you have any questions or concerns. The clinic phone number is [phone].    

## 2014-08-22 NOTE — Progress Notes (Signed)
I CALLED [facility]  AND SPOKE TO RHONDA WHO GAVE ME A PA FOR 3 MONTHS.  SHE CAME UP WITH A 0.00 CO-PAY.  AUTH [id number].

## 2014-08-22 NOTE — Progress Notes (Signed)
Ok to treat per Dr. Marko Plume

## 2014-08-22 NOTE — Progress Notes (Signed)
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 She began treatment with dose dense carboplatin and taxol on 08-08-14, due cycle 1 day 15 today. Peripheral IV access has been difficult, with PAC scheduled for 08-25-14. Counts are lower and she will begin neupogen/ granix day after each treatment.  Patient has been more comfortable with present pain medication and antiemetics during past week, able to eat small amounts regularly and drinking fluids well. She is using the oxymorphone 30 mg q 12 hrs and needed oxyIR 10 mg twice yesterday; she seems to have somewhat less epigastric to left costal margin pain, but has lower abdominal discomfort this AM (did not take oxyIR this AM). She has dilaudid tablets available at home and has used these once in past week. Bowels are moving daily with senokot + colace 2 of each bid. She is using phenergan on scheduled basis, sleeps easily but does not feel excessively sedated; she has slept well at night.  Blood sugar 235 this AM on premed steroids.  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  ONCOLOGIC HISTORY Patient has history of cystitis 2 years ago which presented with suprapubic and back pain, resolved with macrobid x 3 months; CT then was not remarkable. She developed similar symptoms over several months recently and was seen by Dr Nila Nephew with finding of urinary retention. CT AP without contrast at [facility] 06-09-14 showed 9.5 x 14.8 x 10.5 cm complex cystic mass in central pelvis with uterus  surgically absent, no adenopathy abdomen or pelvic sidewall, apparent thickening tail of pancreas and question of peripancreatic stranding, 2.5 x 1.8 cm low density structure adjacent to right atrium. She was seen by Dr Ellouise Newer on 06-11-14, with pelvic mass on exam, CA 125 152 and CEA 3.28 both on 06-11-14. She was seen by Dr Denman George on 06-16-14, at which time she reported 10 lb weight loss in 2 weeks, early satiety, increased constipation and pain; her exam revealed large smooth pelvic mass filling pelvis, adherent to vagina and in close approximation to rectum, minimally mobile. CA 19-9 from 06-16-14 was 224.7. She had MRI abdomen at [facility] on 06-26-14 with findings of abnormal rounded/ truncated appearance of distal pancreatic body/tail with peripancreatic fluid/ inflammatory changes new compared with scans of 2013, associated splenic vein thrombosis, small volume ascites, no suspicious adenopathy, and probable benign pericardial cyst. She had exploratory laparotomy with right salpingo-oophorectomy, omentectomy and radical debulking of ovarian cancer by Dr Denman George at [facility] on 07-01-14. Intraoperative findings were of 20 cm cystic mass arising from right ovary, filling pelvis centrally and adherent to sigmoid colon, 200 cc ascites, peritoneal tumor plaques anterior pelvis and right diaphragm with TNTC 1 cm nodules small bowel mesentery and bowel wall; enlarged pancreatic body was described as rigid and abnormally firm to palpation. At completion of surgery there was residual tumor at the mesentery of the small and large intestine, thin tumor plaques on the diaphragm and pelvic peritoneum, and gallbladder serosa and pouch of Randol Kern representing an incomplete and suboptimal cytoreduction.  Pathology [id number] from 07-01-14 showed 16 x 11 x 7 cm mucinous cystadenocarcinoma of right  ovary with capsule intact, tumor on surface of ovary and identical tumor involving omentum and peritoneal biopsies, no nodes  evaluated, for pT3bpNX/ FIGO IIIB. This pathology was reviewed by 3 pathologists in [facility] system. Patient was stable for DC home on 07-03-14. Within 24 hours after discharge she developed nausea, vomiting and abdominal pain, requiring readmission from 7-25 thru 07-15-14 for SBO and mild pancreatitis. She was managed conservatively in hospital, followed by Dr Denman George, did require NG decompression and support with TNA via PICC. Repeat CT AP was negative for high grade bowel obstruction or transition point; PICC was removed prior to DC home and diabetes was managed with SS insulin. Lovenox was continued in hospital but was not resumed at DC (initially planned x 4 weeks post op). She had endoscopic [place] with biopsy of mass in tail of pancreas by Dr Paulita Fujita on 07-30-14, path confirming mucinous cystadenocarcinoma involvement there. She began dose dense carboplatin taxol on 08-08-14.  Review of systems as above, also: No fever or symptoms of infection. No bleeding. Is fatigued, but able to be up during day. No LE swelling. Bladder ok. No increased SOB. She is trying to stop nervous habit of scratching head. Minimal occasional tingling in fingertips. Remainder of 10 point Review of Systems negative.  Objective:  Vital signs in last 24 hours:  BP 134/71  Pulse 103  Temp(Src) 98.4 F (36.9 C) (Oral)  Resp 20  Ht 5\' 2"  (1.575 m)  Wt 146 lb 12.8 oz (66.588 kg)  BMI 26.84 kg/m2  Weight is up 2 lbs.  Alert, oriented and appropriate. Ambulatory without difficulty. Looks mildly uncomfortable from lower abdominal discomfort, but overall better than any previous visit. Hair thinning.  HEENT:PERRL, sclerae not icteric. Oral mucosa moist without lesions, posterior pharynx clear. Superficial excoriated area top of scalp, does not appear infected. Neck supple. No JVD.  Lymphatics:no cervical,suraclavicular adenopathy Resp: clear to auscultation bilaterally  Cardio: regular rate and rhythm. No gallop. GI: soft,  nontender, mildly distended, no clear mass or organomegaly. Some bowel sounds. Surgical incision not remarkable. Musculoskeletal/ Extremities: without pitting edema, cords, tenderness. Sites of previous IVs UE without cords or erythema Neuro: no peripheral neuropathy. Otherwise nonfocal. Psych: affect brighter  Skin without rash, ecchymosis, petechiae   Lab Results:  Results for orders placed in visit on 08/22/14  CBC WITH DIFFERENTIAL      Result Value Ref Range   WBC 2.0 (*) 3.9 - 10.3 10e3/uL   NEUT# 1.3 (*) 1.5 - 6.5 10e3/uL   HGB 10.6 (*) 11.6 - 15.9 g/dL   HCT 32.6 (*) 34.8 - 46.6 %   Platelets 158  145 - 400 10e3/uL   MCV 86.2  79.5 - 101.0 fL   MCH 27.9  25.1 - 34.0 pg   MCHC 32.4  31.5 - 36.0 g/dL   RBC 3.78  3.70 - 5.45 10e6/uL   RDW 14.2  11.2 - 14.5 %   lymph# 0.6 (*) 0.9 - 3.3 10e3/uL   MONO# 0.0 (*) 0.1 - 0.9 10e3/uL   Eosinophils Absolute 0.0  0.0 - 0.5 10e3/uL   Basophils Absolute 0.0  0.0 - 0.1 10e3/uL   NEUT% 65.6  38.4 - 76.8 %   LYMPH% 32.3  14.0 - 49.7 %   MONO% 1.6  0.0 - 14.0 %   EOS% 0.2  0.0 - 7.0 %   BASO% 0.3  0.0 - 2.0 %  WHOLE BLOOD GLUCOSE      Result Value Ref Range   Glucose 235 (*)  70 - 100 mg/dL   HRS PC 0.5     CMET subsequently available with Na 134, glucose 254, creat 0.7, LFTs normal including AP 79, T prot 6.7, alb 3.1  Studies/Results:  No results found.  Medications: I have reviewed the patient's current medications. Continue present pain medication and antiemetics.  While patient was at office, [facility] staff was able to obtain approval from her insurance for neupogen shots to be self-administered at home, these available today from [employer], $0 copay (see E.Angela Adam documentation). Claritin if aches from neupogen (or taxol) EMLA for PAC  DISCUSSION: Discussed mechanism of action of gCSF and use at lower dose/ shorter time than husband used himself in late 6s. Suggested Claritin if aches from neupogen. Did offer  neupogen at [facility] (appointments set up this way initially), however much easier for them to do at home than another trip to [place]. DMV handicap form completed.  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 Dose dense carbo taxol begun 8-28. More comfortable with other supportive measures in past week. Counts ok for low dose taxol today and will now add neupogen 300 mg day after each treatment. [employer] [facility] testing pending. WIll continue close follow up with weekly chemo for now. 2. Diabetes: on oral agent prior to this diagnosis, with worsening of Hb A1c and higher blood sugars in past few months. Following blood sugars here and at home particularly with variable po intake and decadron with taxol.  3.SBO postoperatively: resolved with conservative care. Senna + stool softeners good now. 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  CHemo orders confirmed and I have spoken with infusion RN.  I will see her with Rx 08-29-14.     Chinedu Agustin P, MD   08/22/2014, 9:04 AM

## 2014-08-23 ENCOUNTER — Other Ambulatory Visit: Payer: Self-pay | Admitting: Oncology

## 2014-08-24 ENCOUNTER — Other Ambulatory Visit: Payer: Self-pay | Admitting: Oncology

## 2014-08-24 ENCOUNTER — Telehealth: Payer: Self-pay | Admitting: Oncology

## 2014-08-24 NOTE — Telephone Encounter (Signed)
PER 9/12 POF CX NEUPOGEN INJ DAY AFTER EACH TX. NO INJS ON SCHEDULE. CX 9/21 LAB DUE TO 9/21 LB/FU ALREADY MOVED TO 9/18. PT TO BE GIVEN NEW SCHEDULE 9/18.

## 2014-08-25 ENCOUNTER — Other Ambulatory Visit: Payer: Self-pay | Admitting: Oncology

## 2014-08-25 ENCOUNTER — Ambulatory Visit (HOSPITAL_COMMUNITY)
Admission: RE | Admit: 2014-08-25 | Discharge: 2014-08-25 | Disposition: A | Discharge status: Home / Self Care | Payer: Managed Care, Other (non HMO) | Source: Ambulatory Visit | Attending: Oncology | Admitting: Oncology

## 2014-08-25 ENCOUNTER — Encounter (HOSPITAL_COMMUNITY): Payer: Self-pay

## 2014-08-25 DIAGNOSIS — C569 Malignant neoplasm of unspecified ovary: Secondary | ICD-10-CM | POA: Diagnosis not present

## 2014-08-25 DIAGNOSIS — K219 Gastro-esophageal reflux disease without esophagitis: Secondary | ICD-10-CM | POA: Diagnosis not present

## 2014-08-25 DIAGNOSIS — C561 Malignant neoplasm of right ovary: Secondary | ICD-10-CM

## 2014-08-25 DIAGNOSIS — I10 Essential (primary) hypertension: Secondary | ICD-10-CM | POA: Insufficient documentation

## 2014-08-25 DIAGNOSIS — Z7982 Long term (current) use of aspirin: Secondary | ICD-10-CM | POA: Diagnosis not present

## 2014-08-25 DIAGNOSIS — I447 Left bundle-branch block, unspecified: Secondary | ICD-10-CM | POA: Diagnosis not present

## 2014-08-25 DIAGNOSIS — Z79899 Other long term (current) drug therapy: Secondary | ICD-10-CM | POA: Diagnosis not present

## 2014-08-25 DIAGNOSIS — Z452 Encounter for adjustment and management of vascular access device: Secondary | ICD-10-CM | POA: Diagnosis present

## 2014-08-25 DIAGNOSIS — E119 Type 2 diabetes mellitus without complications: Secondary | ICD-10-CM | POA: Diagnosis not present

## 2014-08-25 LAB — CBC WITH DIFFERENTIAL/PLATELET
Basophils Absolute: 0 10*3/uL (ref 0.0–0.1)
Basophils Relative: 0 % (ref 0–1)
EOS ABS: 0 10*3/uL (ref 0.0–0.7)
Eosinophils Relative: 0 % (ref 0–5)
HCT: 33.9 % — ABNORMAL LOW (ref 36.0–46.0)
Hemoglobin: 11.4 g/dL — ABNORMAL LOW (ref 12.0–15.0)
Lymphocytes Relative: 17 % (ref 12–46)
Lymphs Abs: 1.3 10*3/uL (ref 0.7–4.0)
MCH: 28.5 pg (ref 26.0–34.0)
MCHC: 33.6 g/dL (ref 30.0–36.0)
MCV: 84.8 fL (ref 78.0–100.0)
Monocytes Absolute: 0.1 10*3/uL (ref 0.1–1.0)
Monocytes Relative: 1 % — ABNORMAL LOW (ref 3–12)
NEUTROS PCT: 82 % — AB (ref 43–77)
Neutro Abs: 6.2 10*3/uL (ref 1.7–7.7)
PLATELETS: 137 10*3/uL — AB (ref 150–400)
RBC: 4 MIL/uL (ref 3.87–5.11)
RDW: 13.7 % (ref 11.5–15.5)
WBC: 7.6 10*3/uL (ref 4.0–10.5)

## 2014-08-25 LAB — PROTIME-INR
INR: 0.99 (ref 0.00–1.49)
PROTHROMBIN TIME: 13.1 s (ref 11.6–15.2)

## 2014-08-25 LAB — GLUCOSE, CAPILLARY: Glucose-Capillary: 130 mg/dL — ABNORMAL HIGH (ref 70–99)

## 2014-08-25 MED ORDER — MIDAZOLAM HCL 2 MG/2ML IJ SOLN
INTRAMUSCULAR | Status: AC | PRN
  Administered 2014-08-25: 2 mg via INTRAVENOUS
  Administered 2014-08-25 (×2): 1 mg via INTRAVENOUS

## 2014-08-25 MED ORDER — FENTANYL CITRATE 0.05 MG/ML IJ SOLN
INTRAMUSCULAR | Status: AC | PRN
  Administered 2014-08-25 (×4): 50 ug via INTRAVENOUS

## 2014-08-25 MED ORDER — SODIUM CHLORIDE 0.9 % IV SOLN
INTRAVENOUS | Status: DC
  Administered 2014-08-25: 13:00:00 via INTRAVENOUS

## 2014-08-25 MED ORDER — HEPARIN SOD (PORK) LOCK FLUSH 100 UNIT/ML IV SOLN
INTRAVENOUS | Status: AC
  Filled 2014-08-25: qty 5

## 2014-08-25 MED ORDER — FENTANYL CITRATE 0.05 MG/ML IJ SOLN
INTRAMUSCULAR | Status: AC
  Filled 2014-08-25: qty 2

## 2014-08-25 MED ORDER — LIDOCAINE HCL 1 % IJ SOLN
INTRAMUSCULAR | Status: DC
Start: 2014-08-25 — End: 2014-08-26
  Filled 2014-08-25: qty 20

## 2014-08-25 MED ORDER — HEPARIN SOD (PORK) LOCK FLUSH 100 UNIT/ML IV SOLN: 500.0000 [IU] | Freq: Once | INTRAVENOUS | Status: DC

## 2014-08-25 MED ORDER — FENTANYL CITRATE 0.05 MG/ML IJ SOLN
INTRAMUSCULAR | Status: AC
  Filled 2014-08-25: qty 4

## 2014-08-25 MED ORDER — MIDAZOLAM HCL 2 MG/2ML IJ SOLN
INTRAMUSCULAR | Status: AC
  Filled 2014-08-25: qty 4

## 2014-08-25 MED ORDER — HEPARIN SOD (PORK) LOCK FLUSH 100 UNIT/ML IV SOLN
INTRAVENOUS | Status: AC | PRN
  Administered 2014-08-25: 500 [IU] via INTRAVENOUS

## 2014-08-25 MED ORDER — MIDAZOLAM HCL 2 MG/2ML IJ SOLN
INTRAMUSCULAR | Status: AC
  Filled 2014-08-25: qty 2

## 2014-08-25 MED ORDER — VANCOMYCIN HCL IN DEXTROSE 1-5 GM/200ML-% IV SOLN
1000.0000 mg | Freq: Once | INTRAVENOUS | Status: DC
  Filled 2014-08-25: qty 200

## 2014-08-25 NOTE — H&P (Signed)
Agree with above note

## 2014-08-25 NOTE — Procedures (Signed)
Procedure: Rt IJ Port catheter  Tip at the cavo-atrial junction  No complication  No significant blood loss  1 gram Vancomycin peri-procedural  Stable to recovery   Signed,  Johny Shears, DO

## 2014-08-25 NOTE — Procedures (Deleted)
Procedure: Rt IJ Port catheter Tip at the cavo-atrial junction No complication No significant blood loss 2grams Ancef peri-procedural Stable to recovery Signed,  Johny Shears, DO

## 2014-08-25 NOTE — H&P (Signed)
Chief Complaint: "I'm here to get a port put in"   Referring Physician(s): Livesay,Lennis P  History of Present Illness: Kristin Meadows is a 58 y.o. 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   Past Medical History  Diagnosis Date  . Hypertension   . Left bundle branch block (LBBB) age 59  . Anxiety   . Diabetes mellitus without complication     type 2  . GERD (gastroesophageal reflux disease)   . H/O hiatal hernia     small  . Arthritis   . Idiopathic neuropathy     biochemical  . Meningioma     x2    Past Surgical History  Procedure Laterality Date  . Abdominal hysterectomy      with left salpingo-oophorectomy, performed via laparotomy for edometriosis at age 77  . Inguinal hernia repair Bilateral 2008  . Appendectomy  early 30's    ruptured for a month before surgery  . Knee surgery Bilateral 2006    x2 on right, x1 left  . Salpingoophorectomy Right 07/01/2014    Procedure: EXPLORATORY LAPAROTOMY/RIGHT SALPINGO OOPHORECTOMY, OMENTECTOMY, TUMOR DEBULKING;  Surgeon: Everitt Amber, MD;  Location: [facility];  Service: Gynecology;  Laterality: Right;  . Abdominal surgery    . Esophagogastroduodenoscopy (egd) with propofol N/A 07/30/2014    Procedure: ESOPHAGOGASTRODUODENOSCOPY (EGD) WITH PROPOFOL;  Surgeon: Arta Silence, MD;  Location: [facility];  Service: Endoscopy;  Laterality: N/A;  . Eus N/A 07/30/2014    Procedure: ESOPHAGEAL ENDOSCOPIC ULTRASOUND (EUS) RADIAL;  Surgeon: Arta Silence, MD;  Location: [facility];  Service: Endoscopy;  Laterality: N/A;    Allergies: Fluzone; Penicillins; Sulfa antibiotics; and Tape  Medications: Prior to Admission medications   Medication Sig Start Date End Date Taking? Authorizing Provider  aspirin EC 81 MG tablet Take 81 mg by mouth every morning.    Yes Historical Provider, MD  dexamethasone (DECADRON) 4 MG tablet Take 5  tabs with food 12 hours prior to Taxol Chemotherapy. 08/20/14  Yes Lennis Marion Downer, MD  docusate sodium (COLACE) 100 MG capsule Take 200 mg by mouth 2 (two) times daily.   Yes Historical Provider, MD  etodolac (LODINE XL) 600 MG 24 hr tablet Take 1 tablet (600 mg total) by mouth daily. 07/03/14  Yes Everitt Amber, MD  fluconazole (DIFLUCAN) 100 MG tablet Take 1 tablet (100 mg total) by mouth daily. For vaginal yeast infection. 08/06/14  Yes Lennis Marion Downer, MD  fluticasone (FLONASE) 50 MCG/ACT nasal spray Place 1 spray into both nostrils daily as needed for allergies.    Yes Historical Provider, MD  HYDROmorphone (DILAUDID) 2 MG tablet Take 1-2 tablets (2-4 mg total) by mouth every 4 (four) hours as needed for severe pain. 08/15/14  Yes Melissa D Cross, NP  losartan (COZAAR) 100 MG tablet Take 100 mg by mouth every morning.    Yes Historical Provider, MD  metFORMIN (GLUCOPHAGE-XR) 500 MG 24 hr tablet Take 1,000 mg by mouth 2 (two) times daily.    Yes Historical Provider, MD  omeprazole (PRILOSEC) 20 MG capsule Take 20 mg by mouth daily.   Yes Historical Provider, MD  ondansetron (ZOFRAN ODT) 8 MG disintegrating tablet Take 1-2 tablets (8-16 mg total) by mouth every 12 (twelve) hours as needed for nausea or vomiting. 08/06/14  Yes Lennis Marion Downer, MD  oxyCODONE (OXY IR/ROXICODONE) 5 MG immediate release tablet Take 1-2 tablets (5-10 mg total) by mouth every  4 (four) hours as needed for severe pain. 08/13/14  Yes Lennis Marion Downer, MD  oxymorphone (OPANA ER) 10 MG 12 hr tablet Take 1 tab in addition to the 20 mg tab for 30 mg total dose every 12 hours for pain. 08/20/14  Yes Lennis Marion Downer, MD  oxymorphone (OPANA ER) 20 MG 12 hr tablet Take 1 tablet (20 mg total) by mouth every 12 (twelve) hours. Take 10mg  opana tablet at same time (for total of 30mg  twice a day) 08/13/14  Yes Lennis Marion Downer, MD  promethazine (PHENERGAN) 25 MG tablet Take 1 tablet (25 mg total) by mouth 3 (three) times daily as needed for nausea or  vomiting. 08/13/14  Yes Lennis Marion Downer, MD  senna (SENOKOT) 8.6 MG TABS tablet Take 1 tablet by mouth every morning.    Yes Historical Provider, MD  tamsulosin (FLOMAX) 0.4 MG CAPS capsule Take 0.4 mg by mouth every evening.    Yes Historical Provider, MD  baclofen (LIORESAL) 10 MG tablet Take 10 mg by mouth 3 (three) times daily as needed for muscle spasms.     Historical Provider, MD  filgrastim (NEUPOGEN) 300 MCG/0.5ML SOLN injection Inject 0.5 mLs (300 mcg total) into the skin once. Day after weekly chemotherapy 08/22/14   Gordy Levan, MD  furosemide (LASIX) 20 MG tablet Take 20 mg by mouth daily as needed for edema.     Historical Provider, MD  LORazepam (ATIVAN) 0.5 MG tablet Place 1-2  tablet under the tongue or swallow every 6 as needed for nausea. 08/15/14   Dorothyann Gibbs, NP    Family History  Problem Relation Age of Onset  . Anesthesia problems Cousin     colon    History   Social History  . Marital Status: Married    Spouse Name: N/A    Number of Children: N/A  . Years of Education: N/A   Social History Main Topics  . Smoking status: Never Smoker   . Smokeless tobacco: Never Used  . Alcohol Use: No  . Drug Use: No  . Sexual Activity: Yes   Other Topics Concern  . None   Social History Narrative  . None         Review of Systems  Constitutional: Negative for fever and chills.       Night sweats  Respiratory: Negative for shortness of breath.        Occ cough  Cardiovascular: Negative for chest pain.  Gastrointestinal: Negative for blood in stool.       Occ abd pain/N/V  Genitourinary: Negative for hematuria.  Musculoskeletal: Negative for back pain.  Neurological: Negative for headaches.  Hematological: Does not bruise/bleed easily.    Vital Signs: BP 123/66  Pulse 99  Temp(Src) 98 F (36.7 C) (Oral)  Resp 20  SpO2 98%  Physical Exam  Constitutional: She is oriented to person, place, and time. She appears well-developed and  well-nourished.  Cardiovascular: Normal rate and regular rhythm.   Pulmonary/Chest: Effort normal and breath sounds normal.  Abdominal: Soft. Bowel sounds are normal.  Mildly tender epigastric region  Musculoskeletal: Normal range of motion. She exhibits no edema.  Neurological: She is alert and oriented to person, place, and time.    Imaging: No results found.  Labs: Lab Results  Component Value Date   WBC 7.6 08/25/2014   HCT 33.9* 08/25/2014   MCV 84.8 08/25/2014   PLT 137* 08/25/2014   NA 134* 08/22/2014   K 4.8 08/22/2014  CL 95* 07/21/2014   CO2 23 08/22/2014   GLUCOSE 254* 08/22/2014   BUN 9.1 08/22/2014   CREATININE 0.7 08/22/2014   CALCIUM 9.6 08/22/2014   PROT 6.7 08/22/2014   ALBUMIN 3.1* 08/22/2014   AST 10 08/22/2014   ALT 13 08/22/2014   ALKPHOS 76 08/22/2014   BILITOT 0.59 08/22/2014   GFRNONAA >90 07/14/2014   GFRAA >90 07/14/2014   INR 0.99 08/25/2014   CA199 203.4* 08/06/2014    Assessment and Plan: Kristin Meadows is a 58 y.o. 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 Details/risks of procedure d/w pt/husband with their understanding and consent.

## 2014-08-25 NOTE — Discharge Instructions (Signed)
Implanted Port Insertion, Care After °Refer to this sheet in the next few weeks. These instructions provide you with information on caring for yourself after your procedure. Your health care provider may also give you more specific instructions. Your treatment has been planned according to current medical practices, but problems sometimes occur. Call your health care provider if you have any problems or questions after your procedure. °WHAT TO EXPECT AFTER THE PROCEDURE °After your procedure, it is typical to have the following:  °· Discomfort at the port insertion site. Ice packs to the area will help. °· Bruising on the skin over the port. This will subside in 3-4 days. °HOME CARE INSTRUCTIONS °· After your port is placed, you will get a manufacturer's information card. The card has information about your port. Keep this card with you at all times.   °· Know what kind of port you have. There are many types of ports available.   °· Wear a medical alert bracelet in case of an emergency. This can help alert health care workers that you have a port.   °· The port can stay in for as long as your health care provider believes it is necessary.   °· A home health care nurse may give medicines and take care of the port.   °· You or a family member can get special training and directions for giving medicine and taking care of the port at home.   °SEEK MEDICAL CARE IF:  °· Your port does not flush or you are unable to get a blood return.   °· You have a fever or chills. °SEEK IMMEDIATE MEDICAL CARE IF: °· You have new fluid or pus coming from your incision.   °· You notice a bad smell coming from your incision site.   °· You have swelling, pain, or more redness at the incision or port site.   °· You have chest pain or shortness of breath. °Document Released: 09/18/2013 Document Revised: 12/03/2013 Document Reviewed: 09/18/2013 °ExitCare® Patient Information ©2015 [employer]. This information is not intended to replace  advice given to you by your health care provider. Make sure you discuss any questions you have with your health care provider. ° ° °Conscious Sedation, Adult, Care After °Refer to this sheet in the next few weeks. These instructions provide you with information on caring for yourself after your procedure. Your health care provider may also give you more specific instructions. Your treatment has been planned according to current medical practices, but problems sometimes occur. Call your health care provider if you have any problems or questions after your procedure. °WHAT TO EXPECT AFTER THE PROCEDURE  °After your procedure: °· You may feel sleepy, clumsy, and have poor balance for several hours. °· Vomiting may occur if you eat too soon after the procedure. °HOME CARE INSTRUCTIONS °· Do not participate in any activities where you could become injured for at least 24 hours. Do not: °¨ Drive. °¨ Swim. °¨ Ride a bicycle. °¨ Operate heavy machinery. °¨ Cook. °¨ Use power tools. °¨ Climb ladders. °¨ Work from a high place. °· Do not make important decisions or sign legal documents until you are improved. °· If you vomit, drink water, juice, or soup when you can drink without vomiting. Make sure you have little or no nausea before eating solid foods. °· Only take over-the-counter or prescription medicines for pain, discomfort, or fever as directed by your health care provider. °· Make sure you and your family fully understand everything about the medicines given to you, including what   side effects may occur. °· You should not drink alcohol, take sleeping pills, or take medicines that cause drowsiness for at least 24 hours. °· If you smoke, do not smoke without supervision. °· If you are feeling better, you may resume normal activities 24 hours after you were sedated. °· Keep all appointments with your health care provider. °SEEK MEDICAL CARE IF: °· Your skin is pale or bluish in color. °· You continue to feel nauseous or  vomit. °· Your pain is getting worse and is not helped by medicine. °· You have bleeding or swelling. °· You are still sleepy or feeling clumsy after 24 hours. °SEEK IMMEDIATE MEDICAL CARE IF: °· You develop a rash. °· You have difficulty breathing. °· You develop any type of allergic problem. °· You have a fever. °MAKE SURE YOU: °· Understand these instructions. °· Will watch your condition. °· Will get help right away if you are not doing well or get worse. °Document Released: 09/18/2013 Document Reviewed: 09/18/2013 °ExitCare® Patient Information ©2015 [employer]. This information is not intended to replace advice given to you by your health care provider. Make sure you discuss any questions you have with your health care provider. °Implanted Port Home Guide °An implanted port is a type of central line that is placed under the skin. Central lines are used to provide IV access when treatment or nutrition needs to be given through a person's veins. Implanted ports are used for long-term IV access. An implanted port may be placed because:  °· You need IV medicine that would be irritating to the small veins in your hands or arms.   °· You need long-term IV medicines, such as antibiotics.   °· You need IV nutrition for a long period.   °· You need frequent blood draws for lab tests.   °· You need dialysis.   °Implanted ports are usually placed in the chest area, but they can also be placed in the upper arm, the abdomen, or the leg. An implanted port has two main parts:  °· Reservoir. The reservoir is round and will appear as a small, raised area under your skin. The reservoir is the part where a needle is inserted to give medicines or draw blood.   °· Catheter. The catheter is a thin, flexible tube that extends from the reservoir. The catheter is placed into a large vein. Medicine that is inserted into the reservoir goes into the catheter and then into the vein.   °HOW WILL I CARE FOR MY INCISION SITE? °Do not get  the incision site wet. Bathe or shower as directed by your health care provider.  °HOW IS MY PORT ACCESSED? °Special steps must be taken to access the port:  °· Before the port is accessed, a numbing cream can be placed on the skin. This helps numb the skin over the port site.   °· Your health care provider uses a sterile technique to access the port. °¨ Your health care provider must put on a mask and sterile gloves. °¨ The skin over your port is cleaned carefully with an antiseptic and allowed to dry. °¨ The port is gently pinched between sterile gloves, and a needle is inserted into the port. °· Only "non-coring" port needles should be used to access the port. Once the port is accessed, a blood return should be checked. This helps ensure that the port is in the vein and is not clogged.   °· If your port needs to remain accessed for a constant infusion, a clear (transparent) bandage   will be placed over the needle site. The bandage and needle will need to be changed every week, or as directed by your health care provider.   °· Keep the bandage covering the needle clean and dry. Do not get it wet. Follow your health care provider's instructions on how to take a shower or bath while the port is accessed.   °· If your port does not need to stay accessed, no bandage is needed over the port.   °WHAT IS FLUSHING? °Flushing helps keep the port from getting clogged. Follow your health care provider's instructions on how and when to flush the port. Ports are usually flushed with saline solution or a medicine called heparin. The need for flushing will depend on how the port is used.  °· If the port is used for intermittent medicines or blood draws, the port will need to be flushed:   °¨ After medicines have been given.   °¨ After blood has been drawn.   °¨ As part of routine maintenance.   °· If a constant infusion is running, the port may not need to be flushed.   °HOW LONG WILL MY PORT STAY IMPLANTED? °The port can stay in  for as long as your health care provider thinks it is needed. When it is time for the port to come out, surgery will be done to remove it. The procedure is similar to the one performed when the port was put in.  °WHEN SHOULD I SEEK IMMEDIATE MEDICAL CARE? °When you have an implanted port, you should seek immediate medical care if:  °· You notice a bad smell coming from the incision site.   °· You have swelling, redness, or drainage at the incision site.   °· You have more swelling or pain at the port site or the surrounding area.   °· You have a fever that is not controlled with medicine. °Document Released: 11/28/2005 Document Revised: 09/18/2013 Document Reviewed: 08/05/2013 °ExitCare® Patient Information ©2015 [employer]. This information is not intended to replace advice given to you by your health care provider. Make sure you discuss any questions you have with your health care provider. ° °

## 2014-08-26 ENCOUNTER — Other Ambulatory Visit: Payer: Self-pay | Admitting: *Deleted

## 2014-08-26 MED ORDER — LIDOCAINE-PRILOCAINE 2.5-2.5 % EX CREA: 1.0000 "application " | TOPICAL_CREAM | CUTANEOUS | Status: DC | PRN

## 2014-08-27 ENCOUNTER — Encounter: Payer: Self-pay | Admitting: *Deleted

## 2014-08-27 ENCOUNTER — Encounter: Payer: Self-pay | Admitting: Oncology

## 2014-08-27 NOTE — Progress Notes (Signed)
[place] Work  Clinical Social Work phoned pt to check in, as she had appointment with CSW to complete ADRs, but then had her appointments at the [facility] changed. Pt reports she will be at the [facility] on Friday and would like to meet then. CSW has blocked time from 10:30-11:30am to complete ADRs with pt. Pt is aware to meet in CSW office on the second floor.    Loren Racer, [place] Worker [place]  [place] Phone: [phone] Fax: [phone]

## 2014-08-29 ENCOUNTER — Ambulatory Visit (HOSPITAL_BASED_OUTPATIENT_CLINIC_OR_DEPARTMENT_OTHER): Payer: Managed Care, Other (non HMO) | Admitting: Oncology

## 2014-08-29 ENCOUNTER — Encounter: Payer: Self-pay | Admitting: *Deleted

## 2014-08-29 ENCOUNTER — Other Ambulatory Visit: Payer: Managed Care, Other (non HMO)

## 2014-08-29 ENCOUNTER — Ambulatory Visit (HOSPITAL_BASED_OUTPATIENT_CLINIC_OR_DEPARTMENT_OTHER): Payer: Managed Care, Other (non HMO)

## 2014-08-29 ENCOUNTER — Telehealth: Payer: Self-pay | Admitting: Oncology

## 2014-08-29 ENCOUNTER — Other Ambulatory Visit (HOSPITAL_BASED_OUTPATIENT_CLINIC_OR_DEPARTMENT_OTHER): Payer: Managed Care, Other (non HMO)

## 2014-08-29 ENCOUNTER — Encounter: Payer: Self-pay | Admitting: Oncology

## 2014-08-29 VITALS — BP 100/65 | HR 115 | Temp 98.1°F | Resp 18 | Ht 62.0 in | Wt 143.0 lb

## 2014-08-29 DIAGNOSIS — C786 Secondary malignant neoplasm of retroperitoneum and peritoneum: Secondary | ICD-10-CM

## 2014-08-29 DIAGNOSIS — C561 Malignant neoplasm of right ovary: Secondary | ICD-10-CM

## 2014-08-29 DIAGNOSIS — C569 Malignant neoplasm of unspecified ovary: Secondary | ICD-10-CM

## 2014-08-29 DIAGNOSIS — G609 Hereditary and idiopathic neuropathy, unspecified: Secondary | ICD-10-CM

## 2014-08-29 DIAGNOSIS — C252 Malignant neoplasm of tail of pancreas: Secondary | ICD-10-CM

## 2014-08-29 DIAGNOSIS — E119 Type 2 diabetes mellitus without complications: Secondary | ICD-10-CM

## 2014-08-29 DIAGNOSIS — Z5111 Encounter for antineoplastic chemotherapy: Secondary | ICD-10-CM

## 2014-08-29 LAB — CBC WITH DIFFERENTIAL/PLATELET
BASO%: 0.4 % (ref 0.0–2.0)
Basophils Absolute: 0 10e3/uL (ref 0.0–0.1)
EOS%: 0.1 % (ref 0.0–7.0)
Eosinophils Absolute: 0 10e3/uL (ref 0.0–0.5)
HCT: 33.8 % — ABNORMAL LOW (ref 34.8–46.6)
HGB: 11 g/dL — ABNORMAL LOW (ref 11.6–15.9)
LYMPH%: 20.2 % (ref 14.0–49.7)
MCH: 27.7 pg (ref 25.1–34.0)
MCHC: 32.6 g/dL (ref 31.5–36.0)
MCV: 84.9 fL (ref 79.5–101.0)
MONO#: 0 10e3/uL — ABNORMAL LOW (ref 0.1–0.9)
MONO%: 0.8 % (ref 0.0–14.0)
NEUT#: 3 10e3/uL (ref 1.5–6.5)
NEUT%: 78.5 % — ABNORMAL HIGH (ref 38.4–76.8)
Platelets: 184 10e3/uL (ref 145–400)
RBC: 3.98 10e6/uL (ref 3.70–5.45)
RDW: 14.2 % (ref 11.2–14.5)
WBC: 3.9 10e3/uL (ref 3.9–10.3)
lymph#: 0.8 10e3/uL — ABNORMAL LOW (ref 0.9–3.3)

## 2014-08-29 LAB — COMPREHENSIVE METABOLIC PANEL (CC13)
ALT: 6 U/L (ref 0–55)
AST: 9 U/L (ref 5–34)
Albumin: 3.1 g/dL — ABNORMAL LOW (ref 3.5–5.0)
Alkaline Phosphatase: 73 U/L (ref 40–150)
Anion Gap: 9 meq/L (ref 3–11)
BUN: 14.4 mg/dL (ref 7.0–26.0)
CO2: 24 meq/L (ref 22–29)
Calcium: 9 mg/dL (ref 8.4–10.4)
Chloride: 98 meq/L (ref 98–109)
Creatinine: 0.7 mg/dL (ref 0.6–1.1)
Glucose: 263 mg/dL — ABNORMAL HIGH (ref 70–140)
Potassium: 4.6 meq/L (ref 3.5–5.1)
Sodium: 131 meq/L — ABNORMAL LOW (ref 136–145)
Total Bilirubin: 0.7 mg/dL (ref 0.20–1.20)
Total Protein: 6.5 g/dL (ref 6.4–8.3)

## 2014-08-29 MED ORDER — CARBOPLATIN CHEMO INJECTION 600 MG/60ML
522.0000 mg | Freq: Once | INTRAVENOUS | Status: AC
  Administered 2014-08-29: 520 mg via INTRAVENOUS
  Filled 2014-08-29: qty 52

## 2014-08-29 MED ORDER — SODIUM CHLORIDE 0.9 % IV SOLN
Freq: Once | INTRAVENOUS | Status: AC
  Administered 2014-08-29: 11:00:00 via INTRAVENOUS

## 2014-08-29 MED ORDER — ONDANSETRON 16 MG/50ML IVPB (CHCC)
16.0000 mg | Freq: Once | INTRAVENOUS | Status: AC
  Administered 2014-08-29: 16 mg via INTRAVENOUS

## 2014-08-29 MED ORDER — SODIUM CHLORIDE 0.9 % IJ SOLN
10.0000 mL | INTRAMUSCULAR | Status: DC | PRN
  Administered 2014-08-29: 10 mL
  Filled 2014-08-29: qty 10

## 2014-08-29 MED ORDER — FAMOTIDINE IN NACL 20-0.9 MG/50ML-% IV SOLN
INTRAVENOUS | Status: AC
  Filled 2014-08-29: qty 50

## 2014-08-29 MED ORDER — DIPHENHYDRAMINE HCL 50 MG/ML IJ SOLN
INTRAMUSCULAR | Status: AC
  Filled 2014-08-29: qty 1

## 2014-08-29 MED ORDER — DEXAMETHASONE SODIUM PHOSPHATE 20 MG/5ML IJ SOLN
INTRAMUSCULAR | Status: AC
  Filled 2014-08-29: qty 5

## 2014-08-29 MED ORDER — HYDROMORPHONE HCL 4 MG/ML IJ SOLN
INTRAMUSCULAR | Status: AC
  Filled 2014-08-29: qty 1

## 2014-08-29 MED ORDER — FAMOTIDINE IN NACL 20-0.9 MG/50ML-% IV SOLN
20.0000 mg | Freq: Once | INTRAVENOUS | Status: AC
  Administered 2014-08-29: 20 mg via INTRAVENOUS

## 2014-08-29 MED ORDER — LORAZEPAM 0.5 MG PO TABS: ORAL_TABLET | ORAL | Status: DC

## 2014-08-29 MED ORDER — HEPARIN SOD (PORK) LOCK FLUSH 100 UNIT/ML IV SOLN
500.0000 [IU] | Freq: Once | INTRAVENOUS | Status: AC | PRN
  Administered 2014-08-29: 500 [IU]
  Filled 2014-08-29: qty 5

## 2014-08-29 MED ORDER — HYDROMORPHONE HCL 2 MG PO TABS: 2.0000 mg | ORAL_TABLET | ORAL | Status: DC | PRN

## 2014-08-29 MED ORDER — DEXAMETHASONE SODIUM PHOSPHATE 20 MG/5ML IJ SOLN
20.0000 mg | Freq: Once | INTRAMUSCULAR | Status: AC
  Administered 2014-08-29: 20 mg via INTRAVENOUS

## 2014-08-29 MED ORDER — DIPHENHYDRAMINE HCL 50 MG/ML IJ SOLN
50.0000 mg | Freq: Once | INTRAMUSCULAR | Status: AC
  Administered 2014-08-29: 50 mg via INTRAVENOUS

## 2014-08-29 MED ORDER — INSULIN REGULAR HUMAN 100 UNIT/ML IJ SOLN
6.0000 [IU] | Freq: Once | INTRAMUSCULAR | Status: AC
  Administered 2014-08-29: 6 [IU] via SUBCUTANEOUS
  Filled 2014-08-29: qty 0.06

## 2014-08-29 MED ORDER — PROMETHAZINE HCL 25 MG PO TABS: 25.0000 mg | ORAL_TABLET | Freq: Three times a day (TID) | ORAL | Status: DC | PRN

## 2014-08-29 MED ORDER — HYDROMORPHONE HCL 1 MG/ML IJ SOLN
1.0000 mg | Freq: Once | INTRAMUSCULAR | Status: AC
  Administered 2014-08-29: 1 mg via INTRAVENOUS
  Filled 2014-08-29: qty 1

## 2014-08-29 MED ORDER — PACLITAXEL CHEMO INJECTION 300 MG/50ML
80.0000 mg/m2 | Freq: Once | INTRAVENOUS | Status: AC
  Administered 2014-08-29: 138 mg via INTRAVENOUS
  Filled 2014-08-29: qty 23

## 2014-08-29 MED ORDER — ONDANSETRON 16 MG/50ML IVPB (CHCC)
INTRAVENOUS | Status: AC
  Filled 2014-08-29: qty 16

## 2014-08-29 MED ORDER — LORAZEPAM 0.5 MG PO TABS: 0.5000 mg | ORAL_TABLET | Freq: Three times a day (TID) | ORAL | Status: DC

## 2014-08-29 NOTE — Progress Notes (Signed)
Kristin Meadows  Clinical Social Meadows was referred by pt  to review and complete healthcare advance directives.  Clinical Social Worker met with patient and her husband in [place] office.  The patient designated Kristin Meadows as their primary healthcare agent and Kristin Meadows as their secondary agent.  Patient also completed healthcare living will.    Clinical Social Worker notarized documents and made copies for patient/family. Clinical Social Worker will send documents to medical records to be scanned into patient's chart. Clinical Social Worker encouraged patient/family to contact with any additional questions or concerns.  Loren Racer, [place] Worker [place]  [place] Phone: [phone] Fax: [phone]

## 2014-08-29 NOTE — Progress Notes (Signed)
Pt's husband came in with financial concerns.  I informed him about the [facility] and [employer] and discussed what those grants will cover.  He will bring in their latest bank statement to apply for those grants on 09/03/14.  I also informed pt of the [employer] card and discussed in detail how the card works.  I gave him the pamphlet on that along with patient accounting's number to apply for the card.  He has Raquel's card to follow up with any questions or concerns.

## 2014-08-29 NOTE — Patient Instructions (Signed)
[facility] Discharge Instructions for Patients Receiving Chemotherapy  Today you received the following chemotherapy agents: Taxol and Carboplatin.   To help prevent nausea and vomiting after your treatment, we encourage you to take your nausea medication as directed.    If you develop nausea and vomiting that is not controlled by your nausea medication, call the clinic.   BELOW ARE SYMPTOMS THAT SHOULD BE REPORTED IMMEDIATELY:  *FEVER GREATER THAN 100.5 F  *CHILLS WITH OR WITHOUT FEVER  NAUSEA AND VOMITING THAT IS NOT CONTROLLED WITH YOUR NAUSEA MEDICATION  *UNUSUAL SHORTNESS OF BREATH  *UNUSUAL BRUISING OR BLEEDING  TENDERNESS IN MOUTH AND THROAT WITH OR WITHOUT PRESENCE OF ULCERS  *URINARY PROBLEMS  *BOWEL PROBLEMS  UNUSUAL RASH Items with * indicate a potential emergency and should be followed up as soon as possible.  Feel free to call the clinic you have any questions or concerns. The clinic phone number is [phone].    

## 2014-08-29 NOTE — Telephone Encounter (Signed)
, °

## 2014-08-29 NOTE — Progress Notes (Signed)
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 She is due day 1 cycle 2 chemo today. She is now using gCSF 300 mcg day after each treatment, self-administering at home. She had PAC placed by IR on 08-25-14 via right IJ, per patient somewhat difficult procedure but no significant complications apparent.  Patient has done fairly well this week other than pain mid and lower abdomen bilaterally when she tried omitting prn pain medication on 08-25-14; she is not having as much epigastric to left subcostal pain now. She had nausea with vomiting x1 last pm, for first time in several days, may have been triggered by coughing. Bowels are moving well. Is fatigued but is up most of day. She is able to eat and is drinking some fluids.    She has PAC. She has remote history of anaphylaxis to flu vaccine, tho no egg allergy. Husband takes flu vaccine. Discussed having prescription for tamiflu available.   ONCOLOGIC HISTORY Patient has history of cystitis 2 years ago which presented with suprapubic and back pain, resolved with macrobid x 3 months; CT then was not remarkable. She developed similar symptoms over several months recently and was seen by Dr Nila Nephew with finding of urinary retention. CT AP without contrast at [facility] 06-09-14 showed 9.5 x 14.8 x 10.5 cm complex cystic mass in central pelvis with uterus surgically absent, no adenopathy abdomen or pelvic sidewall, apparent thickening tail of pancreas and question of peripancreatic stranding, 2.5 x 1.8 cm low  density structure adjacent to right atrium. She was seen by Dr Ellouise Newer on 06-11-14, with pelvic mass on exam, CA 125 152 and CEA 3.28 both on 06-11-14. She was seen by Dr Denman George on 06-16-14, at which time she reported 10 lb weight loss in 2 weeks, early satiety, increased constipation and pain; her exam revealed large smooth pelvic mass filling pelvis, adherent to vagina and in close approximation to rectum, minimally mobile. CA 19-9 from 06-16-14 was 224.7. 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 She had exploratory laparotomy with right salpingo-oophorectomy, omentectomy and radical debulking of ovarian cancer by Dr Denman George at [facility] on 07-01-14. Intraoperative findings were of 20 cm cystic mass arising from right ovary, filling pelvis centrally and adherent to sigmoid colon, 200 cc ascites, peritoneal tumor plaques anterior pelvis and right diaphragm with TNTC 1 cm nodules small bowel mesentery and bowel wall; enlarged pancreatic body was described as rigid and abnormally firm to palpation. At completion of surgery there was residual tumor at the mesentery of the small and large intestine, thin tumor plaques on the diaphragm and pelvic peritoneum, and gallbladder serosa and pouch of Randol Kern representing an incomplete and suboptimal cytoreduction.  Pathology [phone] from 07-01-14 showed 16 x 11 x 7 cm mucinous cystadenocarcinoma of right ovary with capsule intact, tumor on surface of ovary and identical tumor involving omentum and peritoneal biopsies, no nodes evaluated, for pT3bpNX/ FIGO IIIB. This pathology was  reviewed by 3 pathologists in [facility] system. Patient was stable for DC home on 07-03-14. Within 24  hours after discharge she developed nausea, vomiting and abdominal pain, requiring readmission from 7-25 thru 07-15-14 for SBO and mild pancreatitis. She was managed conservatively in hospital, followed by Dr Denman George, did require NG decompression and support with TNA via PICC. Repeat CT AP was negative for high grade bowel obstruction or transition point; PICC was removed prior to DC home and diabetes was managed with SS insulin. Lovenox was continued in hospital but was not resumed at DC (initially planned x 4 weeks post op). She had endoscopic [place] with biopsy of mass in tail of pancreas by Dr Paulita Fujita on 07-30-14, path confirming mucinous cystadenocarcinoma involvement there. She began dose dense carboplatin taxol on 08-08-14.   Review of systems as above, also: No productive cough. No chest pain. No bleeding. No LE swelling. Bladder ok. No increase in chronic peripheral neuropathy symptoms (lower face and thigh), no significant peripheral neuropathy hands or feet. She has not been checking blood sugars, which I have encouraged her to do (at least if symptoms of hypoglycemia) Remainder of 10 point Review of Systems negative.  Objective:  Vital signs in last 24 hours:  BP 100/65  Pulse 115  Temp(Src) 98.1 F (36.7 C) (Oral)  Resp 18  Ht 5' 2"  (1.575 m)  Wt 143 lb (64.864 kg)  BMI 26.15 kg/m2  Weight down 3 lbs.  Alert, oriented and appropriate. Ambulatory without assistance. Looks chronically ill but NAD, respirations not labored RA, able to get on and off exam table. Somewhat pale, not icteric Partial alopecia  HEENT:PERRL, sclerae not icteric. Oral mucosa moist without lesions, posterior pharynx clear.  Neck supple. No JVD.  Lymphatics:no cervical,suraclavicular adenopathy Resp: clear to auscultation bilaterally and normal percussion bilaterally. No cough during visit. Cardio: tachy, regular rate and rhythm. No gallop. GI: soft, slightly tender diffusely across mid and lower abdomen, not  distended, no mass or organomegaly. Some bowel sounds. Surgical incision not remarkable. Musculoskeletal/ Extremities: without pitting edema, cords, tenderness Neuro: no peripheral neuropathy. Otherwise nonfocal. PSYCH appropriate mood and affect. Skin without rash, ecchymosis, petechiae Portacath- resolving ecchymosis at IJ site, none at access site. No erythema. Emla in place  Lab Results:  Results for orders placed in visit on 08/29/14  CBC WITH DIFFERENTIAL      Result Value Ref Range   WBC 3.9  3.9 - 10.3 10e3/uL   NEUT# 3.0  1.5 - 6.5 10e3/uL   HGB 11.0 (*) 11.6 - 15.9 g/dL   HCT 33.8 (*) 34.8 - 46.6 %   Platelets 184  145 - 400 10e3/uL   MCV 84.9  79.5 - 101.0 fL   MCH 27.7  25.1 - 34.0 pg   MCHC 32.6  31.5 - 36.0 g/dL   RBC 3.98  3.70 - 5.45 10e6/uL   RDW 14.2  11.2 - 14.5 %   lymph# 0.8 (*) 0.9 - 3.3 10e3/uL   MONO# 0.0 (*) 0.1 - 0.9 10e3/uL   Eosinophils Absolute 0.0  0.0 - 0.5 10e3/uL   Basophils Absolute 0.0  0.0 - 0.1 10e3/uL   NEUT% 78.5 (*) 38.4 - 76.8 %   LYMPH% 20.2  14.0 - 49.7 %   MONO% 0.8  0.0 - 14.0 %   EOS% 0.1  0.0 - 7.0 %   BASO% 0.4  0.0 - 2.0 %  COMPREHENSIVE METABOLIC PANEL (VZ85)      Result Value Ref Range   Sodium  131 (*) 136 - 145 mEq/L   Potassium 4.6  3.5 - 5.1 mEq/L   Chloride 98  98 - 109 mEq/L   CO2 24  22 - 29 mEq/L   Glucose 263 (*) 70 - 140 mg/dl   BUN 14.4  7.0 - 26.0 mg/dL   Creatinine 0.7  0.6 - 1.1 mg/dL   Total Bilirubin 0.70  0.20 - 1.20 mg/dL   Alkaline Phosphatase 73  40 - 150 U/L   AST 9  5 - 34 U/L   ALT 6  0 - 55 U/L   Total Protein 6.5  6.4 - 8.3 g/dL   Albumin 3.1 (*) 3.5 - 5.0 g/dL   Calcium 9.0  8.4 - 10.4 mg/dL   Anion Gap 9  3 - 11 mEq/L    Markers repeated and available after visit:  CA 125 by new lab method down to 114 from 286 on 08-06-14 and CA 19-9 down to 123 from 224 on 06-16-14. We will let patient know these results by phone.  Studies/Results:  No results found.  Medications: I have reviewed the  patient's current medications. Continue oxymorphone total 30 mg q 12 hrs with prn oxyIR and prn dilaudid ( has used the dilaudid only ~ 2x in past week).   DISCUSSION: patient and husband in agreement with recommendation to continue chemo; we had discussed fact that it could be too early yet to see more than leveling out of markers if this chemo is helping, however results after visit in fact show some improvement. Patient prefers continuing weekly chemo to trying q 3 week carbo taxol; she is comfortable giving neupogen injections herself.  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 Dose dense carbo taxol begun 8-28, for day 1 cycle 2 today. Neupogen at home day after each chemo. Symptoms better/ better controlled than when I first met her and some improvement in markers today. [facility] testing pending. WIll continue close follow up with weekly chemo for now.  2. Diabetes: on oral agent prior to this diagnosis. Following blood sugars here and should check at home particularly with variable po intake and decadron with taxol.  3.SBO postoperatively: resolved with conservative care. Senna + stool softeners good now.  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    LIVESAY,LENNIS P, MD   08/29/2014, 10:00 AM

## 2014-08-29 NOTE — Progress Notes (Signed)
1215-Pt complaining of sharp pain to lower abdomen 5 of 10 and requesting order for IV Dilaudid.  Dr. Marko Plume called and order received to give 1 mg of IV Dilaudid now.  1300-Pt states that pain at a 2 of 10 at this time and has no other complaints at this time.

## 2014-08-30 LAB — CANCER ANTIGEN 19-9: CA 19 9: 123 U/mL — AB (ref <35.0)

## 2014-08-30 LAB — CA 125(PREVIOUS METHOD): CA 125: 75.5 U/mL — ABNORMAL HIGH (ref 0.0–30.2)

## 2014-08-30 LAB — CA 125: CA 125: 114 U/mL — AB (ref <35)

## 2014-09-01 ENCOUNTER — Other Ambulatory Visit: Payer: Managed Care, Other (non HMO)

## 2014-09-01 ENCOUNTER — Ambulatory Visit: Payer: Managed Care, Other (non HMO) | Admitting: Oncology

## 2014-09-02 ENCOUNTER — Telehealth: Payer: Self-pay

## 2014-09-02 NOTE — Telephone Encounter (Signed)
Spoke with Mr. Abeln and gave him the results of the tumor markers as noted below by Dr.Livesay.  Husband verbalized understanding.

## 2014-09-02 NOTE — Telephone Encounter (Signed)
Message copied by Baruch Merl on Tue Sep 02, 2014 10:33 AM ------      Message from: Gordy Levan      Created: Sat Aug 30, 2014  8:35 AM       Labs seen and need follow up: please let her know CA 125 and CA 19-9 both lower since began chemo. With the same new lab method as used on 8-26, CA 125 is down to 114 from 286. The CA 19-9 is down to 123 from 203 on 08-06-14. ------

## 2014-09-03 ENCOUNTER — Encounter: Payer: Self-pay | Admitting: Oncology

## 2014-09-03 NOTE — Progress Notes (Signed)
Hubby here with mom also. The patient will be in on Friday and will sign the grant form for [facility].  Will see if grant available for ovarian cancer 400.00. They will bring bills also to see if we can pay them.

## 2014-09-05 ENCOUNTER — Other Ambulatory Visit (HOSPITAL_BASED_OUTPATIENT_CLINIC_OR_DEPARTMENT_OTHER): Payer: Managed Care, Other (non HMO)

## 2014-09-05 ENCOUNTER — Encounter: Payer: Self-pay | Admitting: Oncology

## 2014-09-05 ENCOUNTER — Ambulatory Visit: Payer: Managed Care, Other (non HMO)

## 2014-09-05 DIAGNOSIS — E119 Type 2 diabetes mellitus without complications: Secondary | ICD-10-CM

## 2014-09-05 DIAGNOSIS — C569 Malignant neoplasm of unspecified ovary: Secondary | ICD-10-CM

## 2014-09-05 DIAGNOSIS — C561 Malignant neoplasm of right ovary: Secondary | ICD-10-CM

## 2014-09-05 LAB — CBC WITH DIFFERENTIAL/PLATELET
BASO%: 0.5 % (ref 0.0–2.0)
Basophils Absolute: 0 10*3/uL (ref 0.0–0.1)
EOS%: 0.1 % (ref 0.0–7.0)
Eosinophils Absolute: 0 10*3/uL (ref 0.0–0.5)
HEMATOCRIT: 32.9 % — AB (ref 34.8–46.6)
HGB: 10.6 g/dL — ABNORMAL LOW (ref 11.6–15.9)
LYMPH#: 0.9 10*3/uL (ref 0.9–3.3)
LYMPH%: 43.6 % (ref 14.0–49.7)
MCH: 27.9 pg (ref 25.1–34.0)
MCHC: 32.3 g/dL (ref 31.5–36.0)
MCV: 86.2 fL (ref 79.5–101.0)
MONO#: 0 10*3/uL — AB (ref 0.1–0.9)
MONO%: 1.8 % (ref 0.0–14.0)
NEUT#: 1.1 10*3/uL — ABNORMAL LOW (ref 1.5–6.5)
NEUT%: 54 % (ref 38.4–76.8)
Platelets: 162 10*3/uL (ref 145–400)
RBC: 3.82 10*6/uL (ref 3.70–5.45)
RDW: 14.5 % (ref 11.2–14.5)
WBC: 2.1 10*3/uL — AB (ref 3.9–10.3)

## 2014-09-05 LAB — COMPREHENSIVE METABOLIC PANEL (CC13)
ALT: 9 U/L (ref 0–55)
AST: 11 U/L (ref 5–34)
Albumin: 3.2 g/dL — ABNORMAL LOW (ref 3.5–5.0)
Alkaline Phosphatase: 71 U/L (ref 40–150)
Anion Gap: 8 mEq/L (ref 3–11)
BUN: 12.5 mg/dL (ref 7.0–26.0)
CALCIUM: 10 mg/dL (ref 8.4–10.4)
CHLORIDE: 96 meq/L — AB (ref 98–109)
CO2: 30 mEq/L — ABNORMAL HIGH (ref 22–29)
Creatinine: 0.7 mg/dL (ref 0.6–1.1)
Glucose: 255 mg/dl — ABNORMAL HIGH (ref 70–140)
POTASSIUM: 4.1 meq/L (ref 3.5–5.1)
Sodium: 134 mEq/L — ABNORMAL LOW (ref 136–145)
Total Bilirubin: 0.55 mg/dL (ref 0.20–1.20)
Total Protein: 6.5 g/dL (ref 6.4–8.3)

## 2014-09-05 NOTE — Progress Notes (Signed)
ANC-1.1 today. Per Dr. Marko Plume, hold chemo; patient takes neupogen at home and is to take tomorrow's dose today. Information with labs/neutropenic precautions given to patient. Patient and husband voice understanding.

## 2014-09-05 NOTE — Progress Notes (Signed)
Pt is approved for $400 [facility] and $400 [employer] grants.

## 2014-09-07 ENCOUNTER — Other Ambulatory Visit: Payer: Self-pay | Admitting: Oncology

## 2014-09-12 ENCOUNTER — Encounter: Payer: Self-pay | Admitting: Oncology

## 2014-09-12 ENCOUNTER — Ambulatory Visit (HOSPITAL_BASED_OUTPATIENT_CLINIC_OR_DEPARTMENT_OTHER): Payer: Managed Care, Other (non HMO)

## 2014-09-12 ENCOUNTER — Telehealth: Payer: Self-pay | Admitting: *Deleted

## 2014-09-12 ENCOUNTER — Telehealth: Payer: Self-pay | Admitting: Oncology

## 2014-09-12 ENCOUNTER — Other Ambulatory Visit: Payer: Self-pay | Admitting: *Deleted

## 2014-09-12 ENCOUNTER — Other Ambulatory Visit: Payer: Managed Care, Other (non HMO)

## 2014-09-12 ENCOUNTER — Ambulatory Visit: Payer: Managed Care, Other (non HMO)

## 2014-09-12 ENCOUNTER — Other Ambulatory Visit (HOSPITAL_BASED_OUTPATIENT_CLINIC_OR_DEPARTMENT_OTHER): Payer: Managed Care, Other (non HMO)

## 2014-09-12 ENCOUNTER — Ambulatory Visit (HOSPITAL_BASED_OUTPATIENT_CLINIC_OR_DEPARTMENT_OTHER): Payer: Managed Care, Other (non HMO) | Admitting: Oncology

## 2014-09-12 VITALS — BP 114/63 | HR 101 | Temp 98.3°F | Resp 18 | Ht 62.0 in | Wt 150.1 lb

## 2014-09-12 DIAGNOSIS — B373 Candidiasis of vulva and vagina: Secondary | ICD-10-CM

## 2014-09-12 DIAGNOSIS — C786 Secondary malignant neoplasm of retroperitoneum and peritoneum: Secondary | ICD-10-CM

## 2014-09-12 DIAGNOSIS — G609 Hereditary and idiopathic neuropathy, unspecified: Secondary | ICD-10-CM

## 2014-09-12 DIAGNOSIS — C252 Malignant neoplasm of tail of pancreas: Secondary | ICD-10-CM

## 2014-09-12 DIAGNOSIS — C561 Malignant neoplasm of right ovary: Secondary | ICD-10-CM

## 2014-09-12 DIAGNOSIS — E119 Type 2 diabetes mellitus without complications: Secondary | ICD-10-CM

## 2014-09-12 DIAGNOSIS — Z5111 Encounter for antineoplastic chemotherapy: Secondary | ICD-10-CM

## 2014-09-12 DIAGNOSIS — B3731 Acute candidiasis of vulva and vagina: Secondary | ICD-10-CM

## 2014-09-12 LAB — COMPREHENSIVE METABOLIC PANEL (CC13)
ALK PHOS: 85 U/L (ref 40–150)
ALT: 12 U/L (ref 0–55)
AST: 11 U/L (ref 5–34)
Albumin: 2.7 g/dL — ABNORMAL LOW (ref 3.5–5.0)
Anion Gap: 8 mEq/L (ref 3–11)
BUN: 8.6 mg/dL (ref 7.0–26.0)
CALCIUM: 9.2 mg/dL (ref 8.4–10.4)
CO2: 25 mEq/L (ref 22–29)
CREATININE: 0.6 mg/dL (ref 0.6–1.1)
Chloride: 100 mEq/L (ref 98–109)
GLUCOSE: 194 mg/dL — AB (ref 70–140)
Potassium: 4.8 mEq/L (ref 3.5–5.1)
Sodium: 133 mEq/L — ABNORMAL LOW (ref 136–145)
Total Bilirubin: 0.46 mg/dL (ref 0.20–1.20)
Total Protein: 5.9 g/dL — ABNORMAL LOW (ref 6.4–8.3)

## 2014-09-12 LAB — CBC WITH DIFFERENTIAL/PLATELET
BASO%: 0.7 % (ref 0.0–2.0)
BASOS ABS: 0 10*3/uL (ref 0.0–0.1)
EOS%: 0.5 % (ref 0.0–7.0)
Eosinophils Absolute: 0 10*3/uL (ref 0.0–0.5)
HCT: 30.9 % — ABNORMAL LOW (ref 34.8–46.6)
HEMOGLOBIN: 10.1 g/dL — AB (ref 11.6–15.9)
LYMPH#: 1 10*3/uL (ref 0.9–3.3)
LYMPH%: 22.4 % (ref 14.0–49.7)
MCH: 28.3 pg (ref 25.1–34.0)
MCHC: 32.8 g/dL (ref 31.5–36.0)
MCV: 86.4 fL (ref 79.5–101.0)
MONO#: 0.1 10*3/uL (ref 0.1–0.9)
MONO%: 2.5 % (ref 0.0–14.0)
NEUT#: 3.2 10*3/uL (ref 1.5–6.5)
NEUT%: 73.9 % (ref 38.4–76.8)
Platelets: 120 10*3/uL — ABNORMAL LOW (ref 145–400)
RBC: 3.57 10*6/uL — AB (ref 3.70–5.45)
RDW: 16.5 % — ABNORMAL HIGH (ref 11.2–14.5)
WBC: 4.4 10*3/uL (ref 3.9–10.3)

## 2014-09-12 LAB — TECHNOLOGIST REVIEW

## 2014-09-12 MED ORDER — DIPHENHYDRAMINE HCL 50 MG/ML IJ SOLN
INTRAMUSCULAR | Status: AC
  Filled 2014-09-12: qty 1

## 2014-09-12 MED ORDER — DEXAMETHASONE SODIUM PHOSPHATE 20 MG/5ML IJ SOLN
INTRAMUSCULAR | Status: AC
  Filled 2014-09-12: qty 5

## 2014-09-12 MED ORDER — FAMOTIDINE IN NACL 20-0.9 MG/50ML-% IV SOLN
20.0000 mg | Freq: Once | INTRAVENOUS | Status: AC
  Administered 2014-09-12: 20 mg via INTRAVENOUS

## 2014-09-12 MED ORDER — FAMOTIDINE IN NACL 20-0.9 MG/50ML-% IV SOLN
INTRAVENOUS | Status: AC
  Filled 2014-09-12: qty 50

## 2014-09-12 MED ORDER — DEXAMETHASONE SODIUM PHOSPHATE 20 MG/5ML IJ SOLN
20.0000 mg | Freq: Once | INTRAMUSCULAR | Status: AC
  Administered 2014-09-12: 20 mg via INTRAVENOUS

## 2014-09-12 MED ORDER — HYDROMORPHONE HCL 2 MG PO TABS: 2.0000 mg | ORAL_TABLET | ORAL | Status: DC | PRN

## 2014-09-12 MED ORDER — ONDANSETRON 8 MG/50ML IVPB (CHCC)
8.0000 mg | Freq: Once | INTRAVENOUS | Status: AC
  Administered 2014-09-12: 8 mg via INTRAVENOUS

## 2014-09-12 MED ORDER — SODIUM CHLORIDE 0.9 % IJ SOLN
10.0000 mL | INTRAMUSCULAR | Status: DC | PRN
  Administered 2014-09-12: 10 mL
  Filled 2014-09-12: qty 10

## 2014-09-12 MED ORDER — LORAZEPAM 0.5 MG PO TABS: ORAL_TABLET | ORAL | Status: DC

## 2014-09-12 MED ORDER — PROMETHAZINE HCL 25 MG PO TABS: 25.0000 mg | ORAL_TABLET | Freq: Three times a day (TID) | ORAL | Status: DC | PRN

## 2014-09-12 MED ORDER — FILGRASTIM 300 MCG/0.5ML IJ SOLN: INTRAMUSCULAR | Status: DC

## 2014-09-12 MED ORDER — TAMSULOSIN HCL 0.4 MG PO CAPS: 0.4000 mg | ORAL_CAPSULE | Freq: Every evening | ORAL | Status: DC

## 2014-09-12 MED ORDER — OXYMORPHONE HCL ER 20 MG PO TB12: 20.0000 mg | ORAL_TABLET | Freq: Two times a day (BID) | ORAL | Status: DC

## 2014-09-12 MED ORDER — OXYMORPHONE HCL ER 10 MG PO TB12: ORAL_TABLET | ORAL | Status: DC

## 2014-09-12 MED ORDER — HEPARIN SOD (PORK) LOCK FLUSH 100 UNIT/ML IV SOLN
500.0000 [IU] | Freq: Once | INTRAVENOUS | Status: AC | PRN
  Administered 2014-09-12: 500 [IU]
  Filled 2014-09-12: qty 5

## 2014-09-12 MED ORDER — DIPHENHYDRAMINE HCL 50 MG/ML IJ SOLN
50.0000 mg | Freq: Once | INTRAMUSCULAR | Status: AC
  Administered 2014-09-12: 50 mg via INTRAVENOUS

## 2014-09-12 MED ORDER — ONDANSETRON 8 MG/NS 50 ML IVPB
INTRAVENOUS | Status: AC
  Filled 2014-09-12: qty 8

## 2014-09-12 MED ORDER — PACLITAXEL CHEMO INJECTION 300 MG/50ML
80.0000 mg/m2 | Freq: Once | INTRAVENOUS | Status: AC
  Administered 2014-09-12: 138 mg via INTRAVENOUS
  Filled 2014-09-12: qty 23

## 2014-09-12 MED ORDER — OSELTAMIVIR PHOSPHATE 75 MG PO CAPS: ORAL_CAPSULE | ORAL | Status: DC

## 2014-09-12 MED ORDER — SODIUM CHLORIDE 0.9 % IV SOLN
Freq: Once | INTRAVENOUS | Status: AC
  Administered 2014-09-12: 10:00:00 via INTRAVENOUS

## 2014-09-12 NOTE — Telephone Encounter (Signed)
PPer staff message and POF I have scheduled appts. Advised scheduler of appts. JMW  JMW

## 2014-09-12 NOTE — Telephone Encounter (Signed)
per pof to sch pt appt-gave pt copy of sch-sent MW email to sch trmt-pt to get updated copy b4 leaving

## 2014-09-12 NOTE — Patient Instructions (Signed)
Paclitaxel injection What is this medicine? PACLITAXEL (PAK li TAX el) is a chemotherapy drug. It targets fast dividing cells, like cancer cells, and causes these cells to die. This medicine is used to treat ovarian cancer, breast cancer, and other cancers. This medicine may be used for other purposes; ask your health care provider or pharmacist if you have questions. COMMON BRAND NAME(S): Onxol, Taxol What should I tell my health care provider before I take this medicine? They need to know if you have any of these conditions: -blood disorders -irregular heartbeat -infection (especially a virus infection such as chickenpox, cold sores, or herpes) -liver disease -previous or ongoing radiation therapy -an unusual or allergic reaction to paclitaxel, alcohol, polyoxyethylated castor oil, other chemotherapy agents, other medicines, foods, dyes, or preservatives -pregnant or trying to get pregnant -breast-feeding How should I use this medicine? This drug is given as an infusion into a vein. It is administered in a hospital or clinic by a specially trained health care professional. Talk to your pediatrician regarding the use of this medicine in children. Special care may be needed. Overdosage: If you think you have taken too much of this medicine contact a poison control center or emergency room at once. NOTE: This medicine is only for you. Do not share this medicine with others. What if I miss a dose? It is important not to miss your dose. Call your doctor or health care professional if you are unable to keep an appointment. What may interact with this medicine? Do not take this medicine with any of the following medications: -disulfiram -metronidazole This medicine may also interact with the following medications: -cyclosporine -diazepam -ketoconazole -medicines to increase blood counts like filgrastim, pegfilgrastim, sargramostim -other chemotherapy drugs like cisplatin, doxorubicin,  epirubicin, etoposide, teniposide, vincristine -quinidine -testosterone -vaccines -verapamil Talk to your doctor or health care professional before taking any of these medicines: -acetaminophen -aspirin -ibuprofen -ketoprofen -naproxen This list may not describe all possible interactions. Give your health care provider a list of all the medicines, herbs, non-prescription drugs, or dietary supplements you use. Also tell them if you smoke, drink alcohol, or use illegal drugs. Some items may interact with your medicine. What should I watch for while using this medicine? Your condition will be monitored carefully while you are receiving this medicine. You will need important blood work done while you are taking this medicine. This drug may make you feel generally unwell. This is not uncommon, as chemotherapy can affect healthy cells as well as cancer cells. Report any side effects. Continue your course of treatment even though you feel ill unless your doctor tells you to stop. In some cases, you may be given additional medicines to help with side effects. Follow all directions for their use. Call your doctor or health care professional for advice if you get a fever, chills or sore throat, or other symptoms of a cold or flu. Do not treat yourself. This drug decreases your body's ability to fight infections. Try to avoid being around people who are sick. This medicine may increase your risk to bruise or bleed. Call your doctor or health care professional if you notice any unusual bleeding. Be careful brushing and flossing your teeth or using a toothpick because you may get an infection or bleed more easily. If you have any dental work done, tell your dentist you are receiving this medicine. Avoid taking products that contain aspirin, acetaminophen, ibuprofen, naproxen, or ketoprofen unless instructed by your doctor. These medicines may hide a fever.   Do not become pregnant while taking this medicine.  Women should inform their doctor if they wish to become pregnant or think they might be pregnant. There is a potential for serious side effects to an unborn child. Talk to your health care professional or pharmacist for more information. Do not breast-feed an infant while taking this medicine. Men are advised not to father a child while receiving this medicine. What side effects may I notice from receiving this medicine? Side effects that you should report to your doctor or health care professional as soon as possible: -allergic reactions like skin rash, itching or hives, swelling of the face, lips, or tongue -low blood counts - This drug may decrease the number of white blood cells, red blood cells and platelets. You may be at increased risk for infections and bleeding. -signs of infection - fever or chills, cough, sore throat, pain or difficulty passing urine -signs of decreased platelets or bleeding - bruising, pinpoint red spots on the skin, black, tarry stools, nosebleeds -signs of decreased red blood cells - unusually weak or tired, fainting spells, lightheadedness -breathing problems -chest pain -high or low blood pressure -mouth sores -nausea and vomiting -pain, swelling, redness or irritation at the injection site -pain, tingling, numbness in the hands or feet -slow or irregular heartbeat -swelling of the ankle, feet, hands Side effects that usually do not require medical attention (report to your doctor or health care professional if they continue or are bothersome): -bone pain -complete hair loss including hair on your head, underarms, pubic hair, eyebrows, and eyelashes -changes in the color of fingernails -diarrhea -loosening of the fingernails -loss of appetite -muscle or joint pain -red flush to skin -sweating This list may not describe all possible side effects. Call your doctor for medical advice about side effects. You may report side effects to FDA at  1-800-FDA-1088. Where should I keep my medicine? This drug is given in a hospital or clinic and will not be stored at home. NOTE: This sheet is a summary. It may not cover all possible information. If you have questions about this medicine, talk to your doctor, pharmacist, or health care provider.  2015, [employer]. (2013-01-21 16:41:21)  

## 2014-09-12 NOTE — Telephone Encounter (Signed)
Received voicemail from [place] at [employer] that they received insurance approval to fill Neupogen injections for patient. Let patient know that she can pick them up at [place] instead of [place].

## 2014-09-12 NOTE — Progress Notes (Signed)
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 Day 8 cycle 2 was held on 09-05-14 due to [place] 1.1; she is due day 15 cycle 2 today.  We will increase neupogen 300 to days 2,3,9,16 (add day 3). Patient is administering the neupogen injections herself at home, as insurance is covering the prescription.  She has felt some better in past week, with appetite mostly good, only occasional nausea, still intermittent pain across low abdomen but none now in epigastrium to LUQ. She has needed dilaudid 2-3x in 24 hours for breakthru, so will not increase oxymorphone sustained release. Bowels are moving only ~ qod with senokot 2 daily and stool softener 2 bid; we have discussed increasing the senokot to 2 bid or adding miralax. 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   PAC in. Anaphylaxis to flu vaccine in past. Will give script for Tamiflu to have available. Husband has had flu vaccine   ONCOLOGIC HISTORY Patient has history of cystitis 2 years ago which presented with suprapubic and back pain, resolved with macrobid x 3 months; CT then was not remarkable. She developed similar symptoms over several months recently and was seen by Dr Nila Nephew with finding of urinary retention. CT AP without [facility] at [facility] 06-09-14 showed 9.5 x 14.8 x 10.5 cm complex cystic mass in central pelvis with uterus surgically absent, no adenopathy abdomen or pelvic sidewall, apparent thickening  tail of pancreas and question of peripancreatic stranding, 2.5 x 1.8 cm low density structure adjacent to right atrium. She was seen by Dr Ellouise Newer on 06-11-14, with pelvic mass on exam, CA 125 152 and CEA 3.28 both on 06-11-14. She was seen by Dr Denman George on 06-16-14, at which time she reported 10 lb weight loss in 2 weeks, early satiety, increased constipation and pain; her exam revealed large smooth pelvic mass filling pelvis, adherent to vagina and in close approximation to rectum, minimally mobile. CA 19-9 from 06-16-14 was 224.7. 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 She had exploratory laparotomy with right salpingo-oophorectomy, omentectomy and radical debulking of ovarian cancer by Dr Denman George at [facility] on 07-01-14. Intraoperative findings were of 20 cm cystic mass arising from right ovary, filling pelvis centrally and adherent to sigmoid colon, 200 cc ascites, peritoneal tumor plaques anterior pelvis and right diaphragm with TNTC 1 cm nodules small bowel mesentery and bowel wall; enlarged pancreatic body was described as rigid and abnormally firm to palpation. At completion of surgery there was residual tumor at the mesentery of the small and large intestine, thin tumor plaques on the diaphragm and pelvic peritoneum, and gallbladder serosa and pouch of Randol Kern representing an incomplete and suboptimal cytoreduction.  Pathology [phone] from 07-01-14 showed 16 x 11 x 7 cm mucinous cystadenocarcinoma of right ovary with capsule intact, tumor on surface of ovary and identical  tumor involving omentum and peritoneal biopsies, no nodes evaluated, for pT3bpNX/ FIGO IIIB. This pathology was reviewed by 3 pathologists in  [facility] system. Patient was stable for DC home on 07-03-14. Within 24 hours after discharge she developed nausea, vomiting and abdominal pain, requiring readmission from 7-25 thru 07-15-14 for SBO and mild pancreatitis. She was managed conservatively in hospital, followed by Dr Denman George, did require NG decompression and support with TNA via PICC. Repeat CT AP was negative for high grade bowel obstruction or transition point; PICC was removed prior to DC home and diabetes was managed with SS insulin. Lovenox was continued in hospital but was not resumed at DC (initially planned x 4 weeks post op). She had endoscopic [place] with biopsy of mass in tail of pancreas by Dr Paulita Fujita on 07-30-14, path confirming mucinous cystadenocarcinoma involvement there. She began dose dense carboplatin taxol on 08-08-14.   Review of systems as above, also: No fever or symptoms of infection. Voiding ok. No increased SOB. More fatigued last week with lower ANC. No increase in baseline peripheral neuropathy, no significant taxol peripheral neuropathy Remainder of 10 point Review of Systems negative.  Objective:  Vital signs in last 24 hours:  BP 114/63  Pulse 101  Temp(Src) 98.3 F (36.8 C) (Oral)  Resp 18  Ht 5\' 2"  (1.575 m)  Wt 150 lb 1.6 oz (68.085 kg)  BMI 27.45 kg/m2  Weight is up 7 lbs.  Alert, oriented and appropriate, looks chronically ill but clearly better overall than prior to start of chemo. Ambulatory without assistance.  Alopecia  HEENT:PERRL, sclerae not icteric. Oral mucosa moist without lesions, posterior pharynx clear.  Neck supple. No JVD.  Lymphatics:no cervical,supraclavicular adenopathy Resp: clear to auscultation bilaterally and normal percussion bilaterally Cardio: regular rate and rhythm. No gallop. GI: soft, nontender in epigastrium or low abdomen, not distended, no mass or organomegaly. Some bowel sounds. Surgical incision not remarkable. Musculoskeletal/ Extremities: without pitting edema,  cords, tenderness. Back not tender. Neuro: no peripheral neuropathy. Otherwise nonfocal. PSYCH affect a little brighter Skin without rash, ecchymosis, petechiae Portacath-without erythema or tenderness  Lab Results:  Results for orders placed in visit on 09/12/14  TECHNOLOGIST REVIEW      Result Value Ref Range   Technologist Review Rare Metas and Myelocytes present    CBC WITH DIFFERENTIAL      Result Value Ref Range   WBC 4.4  3.9 - 10.3 10e3/uL   NEUT# 3.2  1.5 - 6.5 10e3/uL   HGB 10.1 (*) 11.6 - 15.9 g/dL   HCT 30.9 (*) 34.8 - 46.6 %   Platelets 120 (*) 145 - 400 10e3/uL   MCV 86.4  79.5 - 101.0 fL   MCH 28.3  25.1 - 34.0 pg   MCHC 32.8  31.5 - 36.0 g/dL   RBC 3.57 (*) 3.70 - 5.45 10e6/uL   RDW 16.5 (*) 11.2 - 14.5 %   lymph# 1.0  0.9 - 3.3 10e3/uL   MONO# 0.1  0.1 - 0.9 10e3/uL   Eosinophils Absolute 0.0  0.0 - 0.5 10e3/uL   Basophils Absolute 0.0  0.0 - 0.1 10e3/uL   NEUT% 73.9  38.4 - 76.8 %   LYMPH% 22.4  14.0 - 49.7 %   MONO% 2.5  0.0 - 14.0 %   EOS% 0.5  0.0 - 7.0 %   BASO% 0.7  0.0 - 2.0 %  COMPREHENSIVE METABOLIC PANEL (IE33)      Result Value Ref Range   Sodium 133 (*) 136 -  145 mEq/L   Potassium 4.8  3.5 - 5.1 mEq/L   Chloride 100  98 - 109 mEq/L   CO2 25  22 - 29 mEq/L   Glucose 194 (*) 70 - 140 mg/dl   BUN 8.6  7.0 - 26.0 mg/dL   Creatinine 0.6  0.6 - 1.1 mg/dL   Total Bilirubin 0.46  0.20 - 1.20 mg/dL   Alkaline Phosphatase 85  40 - 150 U/L   AST 11  5 - 34 U/L   ALT 12  0 - 55 U/L   Total Protein 5.9 (*) 6.4 - 8.3 g/dL   Albumin 2.7 (*) 3.5 - 5.0 g/dL   Calcium 9.2  8.4 - 10.4 mg/dL   Anion Gap 8  3 - 11 mEq/L    Will continue to follow CA125 and CA19-9 with day 1 each cycle, both of which were elevated at start of treatment. Studies/Results:  No results found.  Medications: I have reviewed the patient's current medications. Refilled ativan, dilaudid, phenergan. Script for tamiflu. Increase neupogen to daily x2 after carbo/taxol and daily x1  after weekly taxols.  DISCUSSION: meds, work considerations as above.  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 Dose dense carbo taxol begun 8-28, for day 15 cycle 2 today. Neupogen at home as above. Symptoms better/ better controlled. Foundation One testing pending. She will have day 1 cycle 3 on 09-19-14 as long as ANC >=1.5 and plt >=100k, and I will see her 10-16 with day 8 cycle 3. 2. Diabetes: on oral agent prior to this diagnosis. Following blood sugars here and should check at home particularly with variable po intake and decadron with taxol.  3.SBO postoperatively: resolved with conservative care. Narcotic constipation: Senna + stool softeners may need to increase as above. 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 Not worse with taxol thus far  5..post remote hysterectomy and left oophorectomy  6.HTN and left BBB, known to cardiology in [place]  7.mammograms 08-04-14 [facility], dense tissue otherwise ok  8.bilateral knee surgeries, appendectomy, left frontal meningioma, hx cystitis  9.multiple drug allergies, including anaphylaxis to flu vaccine  10.vaginal yeast resolved with diflucan  11.Does not have advance directives  12. PAC in    Chemo orders and labs confirmed. All questions answered and she knows to call if needed prior to appointments.   Vina Byrd P, MD   09/12/2014, 12:38 PM

## 2014-09-15 ENCOUNTER — Telehealth: Payer: Self-pay | Admitting: *Deleted

## 2014-09-15 DIAGNOSIS — C561 Malignant neoplasm of right ovary: Secondary | ICD-10-CM

## 2014-09-15 MED ORDER — DEXAMETHASONE 4 MG PO TABS: ORAL_TABLET | ORAL | Status: DC

## 2014-09-15 NOTE — Telephone Encounter (Signed)
Received VM from patient stating she needs refill on decadron tablets before her next taxol treatment. Refill sent to patient's pharmacy and patient called back and notified of this.

## 2014-09-19 ENCOUNTER — Other Ambulatory Visit (HOSPITAL_BASED_OUTPATIENT_CLINIC_OR_DEPARTMENT_OTHER): Payer: Managed Care, Other (non HMO)

## 2014-09-19 ENCOUNTER — Ambulatory Visit (HOSPITAL_BASED_OUTPATIENT_CLINIC_OR_DEPARTMENT_OTHER): Payer: Managed Care, Other (non HMO)

## 2014-09-19 VITALS — BP 129/76 | HR 108 | Temp 98.2°F | Resp 18

## 2014-09-19 DIAGNOSIS — C561 Malignant neoplasm of right ovary: Secondary | ICD-10-CM

## 2014-09-19 DIAGNOSIS — C252 Malignant neoplasm of tail of pancreas: Secondary | ICD-10-CM

## 2014-09-19 DIAGNOSIS — Z5111 Encounter for antineoplastic chemotherapy: Secondary | ICD-10-CM

## 2014-09-19 DIAGNOSIS — C786 Secondary malignant neoplasm of retroperitoneum and peritoneum: Secondary | ICD-10-CM

## 2014-09-19 LAB — CBC WITH DIFFERENTIAL/PLATELET
BASO%: 0.4 % (ref 0.0–2.0)
BASOS ABS: 0 10*3/uL (ref 0.0–0.1)
EOS%: 0.1 % (ref 0.0–7.0)
Eosinophils Absolute: 0 10*3/uL (ref 0.0–0.5)
HCT: 34.1 % — ABNORMAL LOW (ref 34.8–46.6)
HEMOGLOBIN: 11 g/dL — AB (ref 11.6–15.9)
LYMPH#: 0.8 10*3/uL — AB (ref 0.9–3.3)
LYMPH%: 27.1 % (ref 14.0–49.7)
MCH: 28.1 pg (ref 25.1–34.0)
MCHC: 32.2 g/dL (ref 31.5–36.0)
MCV: 87.2 fL (ref 79.5–101.0)
MONO#: 0 10*3/uL — ABNORMAL LOW (ref 0.1–0.9)
MONO%: 1.1 % (ref 0.0–14.0)
NEUT#: 2.1 10*3/uL (ref 1.5–6.5)
NEUT%: 71.3 % (ref 38.4–76.8)
Platelets: 129 10*3/uL — ABNORMAL LOW (ref 145–400)
RBC: 3.91 10*6/uL (ref 3.70–5.45)
RDW: 16.6 % — ABNORMAL HIGH (ref 11.2–14.5)
WBC: 2.9 10*3/uL — ABNORMAL LOW (ref 3.9–10.3)

## 2014-09-19 LAB — COMPREHENSIVE METABOLIC PANEL (CC13)
ALT: 8 U/L (ref 0–55)
ANION GAP: 11 meq/L (ref 3–11)
AST: 8 U/L (ref 5–34)
Albumin: 3.1 g/dL — ABNORMAL LOW (ref 3.5–5.0)
Alkaline Phosphatase: 75 U/L (ref 40–150)
BUN: 12.5 mg/dL (ref 7.0–26.0)
CALCIUM: 10 mg/dL (ref 8.4–10.4)
CHLORIDE: 98 meq/L (ref 98–109)
CO2: 26 mEq/L (ref 22–29)
CREATININE: 0.7 mg/dL (ref 0.6–1.1)
Glucose: 225 mg/dl — ABNORMAL HIGH (ref 70–140)
Potassium: 4.9 mEq/L (ref 3.5–5.1)
Sodium: 135 mEq/L — ABNORMAL LOW (ref 136–145)
Total Bilirubin: 0.57 mg/dL (ref 0.20–1.20)
Total Protein: 6.7 g/dL (ref 6.4–8.3)

## 2014-09-19 MED ORDER — ONDANSETRON 16 MG/50ML IVPB (CHCC)
16.0000 mg | Freq: Once | INTRAVENOUS | Status: AC
  Administered 2014-09-19: 16 mg via INTRAVENOUS

## 2014-09-19 MED ORDER — SODIUM CHLORIDE 0.9 % IV SOLN
Freq: Once | INTRAVENOUS | Status: AC
  Administered 2014-09-19: 10:00:00 via INTRAVENOUS

## 2014-09-19 MED ORDER — DIPHENHYDRAMINE HCL 50 MG/ML IJ SOLN
INTRAMUSCULAR | Status: AC
  Filled 2014-09-19: qty 1

## 2014-09-19 MED ORDER — FAMOTIDINE IN NACL 20-0.9 MG/50ML-% IV SOLN
INTRAVENOUS | Status: AC
  Filled 2014-09-19: qty 50

## 2014-09-19 MED ORDER — SODIUM CHLORIDE 0.9 % IV SOLN
522.0000 mg | Freq: Once | INTRAVENOUS | Status: AC
  Administered 2014-09-19: 520 mg via INTRAVENOUS
  Filled 2014-09-19: qty 52

## 2014-09-19 MED ORDER — HYDROMORPHONE HCL 4 MG/ML IJ SOLN
2.0000 mg | Freq: Once | INTRAMUSCULAR | Status: AC
  Administered 2014-09-19: 2 mg via INTRAVENOUS

## 2014-09-19 MED ORDER — DEXAMETHASONE SODIUM PHOSPHATE 20 MG/5ML IJ SOLN
INTRAMUSCULAR | Status: AC
  Filled 2014-09-19: qty 5

## 2014-09-19 MED ORDER — DIPHENHYDRAMINE HCL 50 MG/ML IJ SOLN
50.0000 mg | Freq: Once | INTRAMUSCULAR | Status: AC
  Administered 2014-09-19: 50 mg via INTRAVENOUS

## 2014-09-19 MED ORDER — ONDANSETRON 16 MG/50ML IVPB (CHCC)
INTRAVENOUS | Status: AC
  Filled 2014-09-19: qty 16

## 2014-09-19 MED ORDER — DEXAMETHASONE SODIUM PHOSPHATE 20 MG/5ML IJ SOLN
20.0000 mg | Freq: Once | INTRAMUSCULAR | Status: AC
  Administered 2014-09-19: 20 mg via INTRAVENOUS

## 2014-09-19 MED ORDER — HEPARIN SOD (PORK) LOCK FLUSH 100 UNIT/ML IV SOLN
500.0000 [IU] | Freq: Once | INTRAVENOUS | Status: AC | PRN
  Administered 2014-09-19: 500 [IU]
  Filled 2014-09-19: qty 5

## 2014-09-19 MED ORDER — HYDROMORPHONE HCL 4 MG/ML IJ SOLN
INTRAMUSCULAR | Status: AC
  Filled 2014-09-19: qty 1

## 2014-09-19 MED ORDER — DEXTROSE 5 % IV SOLN
80.0000 mg/m2 | Freq: Once | INTRAVENOUS | Status: AC
  Administered 2014-09-19: 138 mg via INTRAVENOUS
  Filled 2014-09-19: qty 23

## 2014-09-19 MED ORDER — SODIUM CHLORIDE 0.9 % IJ SOLN
10.0000 mL | INTRAMUSCULAR | Status: DC | PRN
  Administered 2014-09-19: 10 mL
  Filled 2014-09-19: qty 10

## 2014-09-19 MED ORDER — FAMOTIDINE IN NACL 20-0.9 MG/50ML-% IV SOLN
20.0000 mg | Freq: Once | INTRAVENOUS | Status: AC
  Administered 2014-09-19: 20 mg via INTRAVENOUS

## 2014-09-19 MED ORDER — INSULIN REGULAR HUMAN 100 UNIT/ML IJ SOLN
4.0000 [IU] | Freq: Once | INTRAMUSCULAR | Status: AC
  Administered 2014-09-19: 4 [IU] via SUBCUTANEOUS
  Filled 2014-09-19: qty 0.04

## 2014-09-19 NOTE — Patient Instructions (Signed)
[facility] Discharge Instructions for Patients Receiving Chemotherapy  Today you received the following chemotherapy agents Taxol and Carboplatin Remember to take your neupogen injection for two days after this treatment. To help prevent nausea and vomiting after your treatment, we encourage you to take your nausea medication as directed   If you develop nausea and vomiting that is not controlled by your nausea medication, call the clinic.   BELOW ARE SYMPTOMS THAT SHOULD BE REPORTED IMMEDIATELY:  *FEVER GREATER THAN 100.5 F  *CHILLS WITH OR WITHOUT FEVER  NAUSEA AND VOMITING THAT IS NOT CONTROLLED WITH YOUR NAUSEA MEDICATION  *UNUSUAL SHORTNESS OF BREATH  *UNUSUAL BRUISING OR BLEEDING  TENDERNESS IN MOUTH AND THROAT WITH OR WITHOUT PRESENCE OF ULCERS  *URINARY PROBLEMS  *BOWEL PROBLEMS  UNUSUAL RASH Items with * indicate a potential emergency and should be followed up as soon as possible.  Feel free to call the clinic you have any questions or concerns. The clinic phone number is [phone].

## 2014-09-19 NOTE — Progress Notes (Unsigned)
Kristin Area NP notified of patients pulse rate 108 and 104 (Dr. Marko Plume is out of office today).   VO given and received to proceed with tx depsiite pulse rate. Patient requesting pain medication.  She would like IV Dilaudid as she has had before.  Discussed with Kristin Meadows NO  VO given and received for Dilaudid 2mg  IV.

## 2014-09-20 LAB — CA 125: CA 125: 64 U/mL — ABNORMAL HIGH (ref <35)

## 2014-09-20 LAB — CANCER ANTIGEN 19-9: CA 19 9: 39.2 U/mL — AB (ref <35.0)

## 2014-09-23 ENCOUNTER — Telehealth: Payer: Self-pay

## 2014-09-23 DIAGNOSIS — C561 Malignant neoplasm of right ovary: Secondary | ICD-10-CM

## 2014-09-23 MED ORDER — "NEEDLE (DISP) 25G X 5/8"" MISC": Status: DC

## 2014-09-23 NOTE — Telephone Encounter (Signed)
Told Kristin Meadows the lab results as noted below by Dr. Marko Plume. Called in 25g 5/8' sq needles for neupogen syringes for injections at home per patient request.

## 2014-09-23 NOTE — Telephone Encounter (Signed)
Message copied by Baruch Merl on Tue Sep 23, 2014  9:39 AM ------      Message from: Gordy Levan      Created: Mon Sep 22, 2014  9:39 PM       Labs seen and need follow up: please let her know ca125 down to 64 (from comparison 114) and CA 19-9 down to 39 from 123 on 08-29-14 ------

## 2014-09-25 ENCOUNTER — Other Ambulatory Visit: Payer: Self-pay | Admitting: Oncology

## 2014-09-26 ENCOUNTER — Ambulatory Visit (HOSPITAL_BASED_OUTPATIENT_CLINIC_OR_DEPARTMENT_OTHER): Payer: Managed Care, Other (non HMO)

## 2014-09-26 ENCOUNTER — Encounter: Payer: Self-pay | Admitting: Oncology

## 2014-09-26 ENCOUNTER — Telehealth: Payer: Self-pay | Admitting: *Deleted

## 2014-09-26 ENCOUNTER — Other Ambulatory Visit (HOSPITAL_BASED_OUTPATIENT_CLINIC_OR_DEPARTMENT_OTHER): Payer: Managed Care, Other (non HMO)

## 2014-09-26 ENCOUNTER — Telehealth: Payer: Self-pay | Admitting: Oncology

## 2014-09-26 ENCOUNTER — Ambulatory Visit (HOSPITAL_BASED_OUTPATIENT_CLINIC_OR_DEPARTMENT_OTHER): Payer: Managed Care, Other (non HMO) | Admitting: Oncology

## 2014-09-26 VITALS — BP 109/64 | HR 128 | Temp 98.4°F | Resp 18 | Ht 62.0 in | Wt 140.2 lb

## 2014-09-26 DIAGNOSIS — C561 Malignant neoplasm of right ovary: Secondary | ICD-10-CM

## 2014-09-26 DIAGNOSIS — C7961 Secondary malignant neoplasm of right ovary: Secondary | ICD-10-CM

## 2014-09-26 DIAGNOSIS — C801 Malignant (primary) neoplasm, unspecified: Secondary | ICD-10-CM

## 2014-09-26 DIAGNOSIS — D709 Neutropenia, unspecified: Secondary | ICD-10-CM

## 2014-09-26 DIAGNOSIS — C7989 Secondary malignant neoplasm of other specified sites: Secondary | ICD-10-CM

## 2014-09-26 DIAGNOSIS — E860 Dehydration: Secondary | ICD-10-CM

## 2014-09-26 DIAGNOSIS — E119 Type 2 diabetes mellitus without complications: Secondary | ICD-10-CM

## 2014-09-26 DIAGNOSIS — C252 Malignant neoplasm of tail of pancreas: Secondary | ICD-10-CM

## 2014-09-26 LAB — COMPREHENSIVE METABOLIC PANEL (CC13)
ALBUMIN: 3.2 g/dL — AB (ref 3.5–5.0)
ALK PHOS: 78 U/L (ref 40–150)
ALT: 13 U/L (ref 0–55)
AST: 14 U/L (ref 5–34)
Anion Gap: 11 mEq/L (ref 3–11)
BILIRUBIN TOTAL: 0.7 mg/dL (ref 0.20–1.20)
BUN: 13.5 mg/dL (ref 7.0–26.0)
CO2: 28 mEq/L (ref 22–29)
Calcium: 9.6 mg/dL (ref 8.4–10.4)
Chloride: 97 mEq/L — ABNORMAL LOW (ref 98–109)
Creatinine: 0.7 mg/dL (ref 0.6–1.1)
Glucose: 138 mg/dl (ref 70–140)
Potassium: 3.4 mEq/L — ABNORMAL LOW (ref 3.5–5.1)
SODIUM: 137 meq/L (ref 136–145)
TOTAL PROTEIN: 6.3 g/dL — AB (ref 6.4–8.3)

## 2014-09-26 LAB — CBC WITH DIFFERENTIAL/PLATELET
BASO%: 1.4 % (ref 0.0–2.0)
Basophils Absolute: 0 10*3/uL (ref 0.0–0.1)
EOS%: 4.2 % (ref 0.0–7.0)
Eosinophils Absolute: 0.1 10*3/uL (ref 0.0–0.5)
HCT: 31.2 % — ABNORMAL LOW (ref 34.8–46.6)
HGB: 10.3 g/dL — ABNORMAL LOW (ref 11.6–15.9)
LYMPH%: 51.3 % — AB (ref 14.0–49.7)
MCH: 28.6 pg (ref 25.1–34.0)
MCHC: 33 g/dL (ref 31.5–36.0)
MCV: 86.6 fL (ref 79.5–101.0)
MONO#: 0.2 10*3/uL (ref 0.1–0.9)
MONO%: 6.6 % (ref 0.0–14.0)
NEUT%: 36.5 % — ABNORMAL LOW (ref 38.4–76.8)
NEUTROS ABS: 1.2 10*3/uL — AB (ref 1.5–6.5)
PLATELETS: 161 10*3/uL (ref 145–400)
RBC: 3.6 10*6/uL — AB (ref 3.70–5.45)
RDW: 17.4 % — ABNORMAL HIGH (ref 11.2–14.5)
WBC: 3.4 10*3/uL — ABNORMAL LOW (ref 3.9–10.3)
lymph#: 1.7 10*3/uL (ref 0.9–3.3)

## 2014-09-26 MED ORDER — LORAZEPAM 0.5 MG PO TABS: ORAL_TABLET | ORAL | Status: DC

## 2014-09-26 MED ORDER — SODIUM CHLORIDE 0.9 % IV SOLN
INTRAVENOUS | Status: DC
  Administered 2014-09-26: 10:00:00 via INTRAVENOUS
  Filled 2014-09-26: qty 1000

## 2014-09-26 MED ORDER — PROMETHAZINE HCL 25 MG/ML IJ SOLN
25.0000 mg | Freq: Once | INTRAMUSCULAR | Status: AC
  Administered 2014-09-26: 25 mg via INTRAVENOUS
  Filled 2014-09-26: qty 1

## 2014-09-26 MED ORDER — TBO-FILGRASTIM 300 MCG/0.5ML ~~LOC~~ SOSY
300.0000 ug | PREFILLED_SYRINGE | Freq: Once | SUBCUTANEOUS | Status: AC
  Administered 2014-09-26: 300 ug via SUBCUTANEOUS
  Filled 2014-09-26: qty 0.5

## 2014-09-26 MED ORDER — HYDROMORPHONE HCL 2 MG PO TABS: 2.0000 mg | ORAL_TABLET | ORAL | Status: DC | PRN

## 2014-09-26 NOTE — Telephone Encounter (Signed)
Per staff phone call and POF I have schedueld appts. Scheduler advised of appts.  JMW  

## 2014-09-26 NOTE — Telephone Encounter (Signed)
per pof to sch pt appt-sent MEW emailt to sch trmt-pt will get updated copy b4 leaving trmt room

## 2014-09-26 NOTE — Progress Notes (Signed)
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 Even with concerns today, overall she is improved on this chemotherapy. She did take neupogen 300 mcg on days 2 and 3 this cycle (administers herself at home).   Patient has had more nausea and intermittent vomiting since day 1 cycle 3 carbo + taxol on 09-19-14, with emesis last pm and again this AM. She has lost 10 lbs since 09-12-14 by our scales. Last bowel movement was 09-25-14, mostly moving daily with miralax now one cap daily. She notices GERD if she misses even one dose of prilosec. She no longer has significant pain epigastrium to LUQ abdomen, still low abdominal cramping pain which is some better after bowels move. She has some soreness "like pulled muscle" RLQ today. She continues oxymorphone 10 mg q 12 hrs, and only occasionally needs additional prn dilaudid. Phenergan and ativan are most helpful for nausea. She has just occasional peripheral neuropathy symptoms hands and feet, not increased. Feels a little lightheaded walking now.  PAC in.  Anaphylaxis to flu vaccine in past. Has script for Tamiflu available.  ONCOLOGIC HISTORY Patient has history of cystitis 2 years ago which presented with suprapubic and back pain, resolved with macrobid x 3 months; CT then was not remarkable. She developed similar symptoms over several months recently and was seen by Dr Nila Nephew with finding of urinary retention. CT AP without contrast at [facility] 06-09-14 showed 9.5 x 14.8 x 10.5 cm complex  cystic mass in central pelvis with uterus surgically absent, no adenopathy abdomen or pelvic sidewall, apparent thickening tail of pancreas and question of peripancreatic stranding, 2.5 x 1.8 cm low density structure adjacent to right atrium. She was seen by Dr Ellouise Newer on 06-11-14, with pelvic mass on exam, CA 125 152 and CEA 3.28 both on 06-11-14. She was seen by Dr Denman George on 06-16-14, at which time she reported 10 lb weight loss in 2 weeks, early satiety, increased constipation and pain; her exam revealed large smooth pelvic mass filling pelvis, adherent to vagina and in close approximation to rectum, minimally mobile. CA 19-9 from 06-16-14 was 224.7. She had MRI abdomen at [facility] on 06-26-14 with findings of abnormal rounded/ truncated appearance of distal pancreatic body/tail with peripancreatic fluid/ inflammatory changes new compared with scans of 2013, associated splenic vein thrombosis, small volume ascites, no suspicious adenopathy, and probable benign pericardial cyst. She had exploratory laparotomy with right salpingo-oophorectomy, omentectomy and radical debulking of ovarian cancer by Dr Denman George at [facility] on 07-01-14. Intraoperative findings were of 20 cm cystic mass arising from right ovary, filling pelvis centrally and adherent to sigmoid colon, 200 cc ascites, peritoneal tumor plaques anterior pelvis and right diaphragm with TNTC 1 cm nodules small bowel mesentery and bowel wall; enlarged pancreatic body was described as rigid and abnormally firm to palpation. At completion of surgery there was residual tumor at the mesentery of the small and large intestine, thin tumor plaques on the diaphragm and pelvic peritoneum, and gallbladder serosa and pouch of Randol Kern representing an incomplete and suboptimal cytoreduction.  Pathology [id number] from 07-01-14 showed 16  x 11 x 7 cm mucinous cystadenocarcinoma of right ovary with capsule intact, tumor on surface of ovary and identical tumor involving  omentum and peritoneal biopsies, no nodes evaluated, for pT3bpNX/ FIGO IIIB. This pathology was reviewed by 3 pathologists in [facility] system. Patient was stable for DC home on 07-03-14. Within 24 hours after discharge she developed nausea, vomiting and abdominal pain, requiring readmission from 7-25 thru 07-15-14 for SBO and mild pancreatitis. She was managed conservatively in hospital, followed by Dr Denman George, did require NG decompression and support with TNA via PICC. Repeat CT AP was negative for high grade bowel obstruction or transition point; PICC was removed prior to DC home and diabetes was managed with SS insulin. Lovenox was continued in hospital but was not resumed at DC (initially planned x 4 weeks post op). She had endoscopic [place] with biopsy of mass in tail of pancreas by Dr Paulita Fujita on 07-30-14, path confirming mucinous cystadenocarcinoma involvement there. She began dose dense carboplatin taxol on 08-08-14.   Review of systems as above, also: No problems with PAC. No fever or symptoms of infection. No LE swelling. She has not checked blood sugars at home. Remainder of 10 point Review of Systems negative.  Objective:  Vital signs in last 24 hours:  BP 109/64  Pulse 128  Temp(Src) 98.4 F (36.9 C) (Oral)  Resp 18  Ht 5\' 2"  (1.575 m)  Wt 140 lb 3.2 oz (63.594 kg)  BMI 25.64 kg/m2 Weight down from 150 on 09-12-14. Alert, oriented and appropriate. Ambulatory without assistance.  Alopecia  HEENT:PERRL, sclerae not icteric. Oral mucosa somewhat dry without lesions, posterior pharynx clear.  Neck supple. No JVD.  Lymphatics:no inguinal adenopathy. Probable small nodes left supraclavicular Resp: clear to auscultation bilaterally and normal percussion bilaterally Cardio: more tachy than usual, regular rate and rhythm. No gallop. GI: soft, nontender, not distended, no mass or organomegaly. Few bowel sounds.  Musculoskeletal/ Extremities: without pitting edema, cords, tenderness Neuro: no  peripheral neuropathy. Otherwise nonfocal Skin without rash, ecchymosis, petechiae Portacath-without erythema or tenderness  Lab Results:  Results for orders placed in visit on 09/26/14  CBC WITH DIFFERENTIAL      Result Value Ref Range   WBC 3.4 (*) 3.9 - 10.3 10e3/uL   NEUT# 1.2 (*) 1.5 - 6.5 10e3/uL   HGB 10.3 (*) 11.6 - 15.9 g/dL   HCT 31.2 (*) 34.8 - 46.6 %   Platelets 161  145 - 400 10e3/uL   MCV 86.6  79.5 - 101.0 fL   MCH 28.6  25.1 - 34.0 pg   MCHC 33.0  31.5 - 36.0 g/dL   RBC 3.60 (*) 3.70 - 5.45 10e6/uL   RDW 17.4 (*) 11.2 - 14.5 %   lymph# 1.7  0.9 - 3.3 10e3/uL   MONO# 0.2  0.1 - 0.9 10e3/uL   Eosinophils Absolute 0.1  0.0 - 0.5 10e3/uL   Basophils Absolute 0.0  0.0 - 0.1 10e3/uL   NEUT% 36.5 (*) 38.4 - 76.8 %   LYMPH% 51.3 (*) 14.0 - 49.7 %   MONO% 6.6  0.0 - 14.0 %   EOS% 4.2  0.0 - 7.0 %   BASO% 1.4  0.0 - 2.0 %  COMPREHENSIVE METABOLIC PANEL (ZO10)      Result Value Ref Range   Sodium 137  136 - 145 mEq/L   Potassium 3.4 (*) 3.5 - 5.1 mEq/L   Chloride 97 (*) 98 - 109 mEq/L   CO2 28  22 - 29 mEq/L  Glucose 138  70 - 140 mg/dl   BUN 13.5  7.0 - 26.0 mg/dL   Creatinine 0.7  0.6 - 1.1 mg/dL   Total Bilirubin 0.70  0.20 - 1.20 mg/dL   Alkaline Phosphatase 78  40 - 150 U/L   AST 14  5 - 34 U/L   ALT 13  0 - 55 U/L   Total Protein 6.3 (*) 6.4 - 8.3 g/dL   Albumin 3.2 (*) 3.5 - 5.0 g/dL   Calcium 9.6  8.4 - 10.4 mg/dL   Anion Gap 11  3 - 11 mEq/L    CA 125 on 09-19-14 64, down from 114 on 9-18 and 286 on 08-06-14 (all by new lab method) CA 19-9 down to 39.2 on 09-19-14 from 123 on 9-18 and 203 on 08-06-14  Studies/Results:  No results found.  Medications: I have reviewed the patient's current medications. Hold day 8 cycle 3 today. Give IV phenergan, NS with 20 Kcl and also neupogen here if insurance allows. Prescriptions rewritten for dilaudid and ativan.  DISCUSSION: patient and husband understand reasoning for holding chemo today, and agree with IVF and  other interventions instead as above. Will give day 15 cycle 3 on 10-23 as long as ANC >=about 1.5 and plt >=100k. I will see her with day 1 cycle 4 on 10-10-14.   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 Dose dense carbo taxol begun 8-28,  Neupogen at home. Hold day 8 cycle 3 today with lower ANC and dehydration, IVF instead. Foundation One testing pending.  2. Diabetes: on oral agent prior to this diagnosis. Following blood sugars here and should check at home particularly with variable po intake and decadron with taxol, discussed again. 3.SBO postoperatively: resolved with conservative care. 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 Not worse with taxol thus far  5..post remote hysterectomy and left oophorectomy  6.HTN and left BBB, known to cardiology in [place]  7.mammograms 08-04-14 [facility], dense tissue otherwise ok  8.bilateral knee surgeries, appendectomy, left frontal meningioma, hx cystitis  9.multiple drug allergies, including anaphylaxis to flu vaccine  10.vaginal yeast resolved with diflucan  11.Does not have advance directives  12. PAC in 13.hypokalemia: due to vomiting, supplement with IVF now   All questions answered and patient/husband in agreement with plan. Time spent 25 min including >50% counseling and coordination of care.    LIVESAY,LENNIS P, MD   09/26/2014, 9:32 AM

## 2014-09-26 NOTE — Patient Instructions (Signed)

## 2014-10-01 ENCOUNTER — Encounter: Payer: Self-pay | Admitting: Oncology

## 2014-10-01 NOTE — Progress Notes (Signed)
Per Adonis Huguenin. 400.00 assistance has been used. Only chcc funds available.

## 2014-10-03 ENCOUNTER — Other Ambulatory Visit (HOSPITAL_BASED_OUTPATIENT_CLINIC_OR_DEPARTMENT_OTHER): Payer: Managed Care, Other (non HMO)

## 2014-10-03 ENCOUNTER — Ambulatory Visit (HOSPITAL_BASED_OUTPATIENT_CLINIC_OR_DEPARTMENT_OTHER): Payer: Managed Care, Other (non HMO)

## 2014-10-03 VITALS — BP 116/66 | HR 101 | Temp 98.1°F | Resp 20

## 2014-10-03 DIAGNOSIS — E119 Type 2 diabetes mellitus without complications: Secondary | ICD-10-CM

## 2014-10-03 DIAGNOSIS — Z5111 Encounter for antineoplastic chemotherapy: Secondary | ICD-10-CM

## 2014-10-03 DIAGNOSIS — C801 Malignant (primary) neoplasm, unspecified: Secondary | ICD-10-CM

## 2014-10-03 DIAGNOSIS — C7989 Secondary malignant neoplasm of other specified sites: Secondary | ICD-10-CM

## 2014-10-03 DIAGNOSIS — C561 Malignant neoplasm of right ovary: Secondary | ICD-10-CM

## 2014-10-03 DIAGNOSIS — C7961 Secondary malignant neoplasm of right ovary: Secondary | ICD-10-CM

## 2014-10-03 LAB — COMPREHENSIVE METABOLIC PANEL (CC13)
ALK PHOS: 76 U/L (ref 40–150)
ALT: 15 U/L (ref 0–55)
AST: 10 U/L (ref 5–34)
Albumin: 2.9 g/dL — ABNORMAL LOW (ref 3.5–5.0)
Anion Gap: 10 mEq/L (ref 3–11)
BUN: 9.3 mg/dL (ref 7.0–26.0)
CO2: 24 mEq/L (ref 22–29)
CREATININE: 0.7 mg/dL (ref 0.6–1.1)
Calcium: 9.2 mg/dL (ref 8.4–10.4)
Chloride: 99 mEq/L (ref 98–109)
GLUCOSE: 248 mg/dL — AB (ref 70–140)
Potassium: 5 mEq/L (ref 3.5–5.1)
Sodium: 133 mEq/L — ABNORMAL LOW (ref 136–145)
Total Bilirubin: 0.55 mg/dL (ref 0.20–1.20)
Total Protein: 6.2 g/dL — ABNORMAL LOW (ref 6.4–8.3)

## 2014-10-03 LAB — CBC WITH DIFFERENTIAL/PLATELET
BASO%: 0.2 % (ref 0.0–2.0)
BASOS ABS: 0 10*3/uL (ref 0.0–0.1)
EOS%: 0.1 % (ref 0.0–7.0)
Eosinophils Absolute: 0 10*3/uL (ref 0.0–0.5)
HEMATOCRIT: 33 % — AB (ref 34.8–46.6)
HEMOGLOBIN: 10.6 g/dL — AB (ref 11.6–15.9)
LYMPH%: 27.1 % (ref 14.0–49.7)
MCH: 28.5 pg (ref 25.1–34.0)
MCHC: 32.1 g/dL (ref 31.5–36.0)
MCV: 88.9 fL (ref 79.5–101.0)
MONO#: 0.1 10*3/uL (ref 0.1–0.9)
MONO%: 2.2 % (ref 0.0–14.0)
NEUT#: 2.5 10*3/uL (ref 1.5–6.5)
NEUT%: 70.4 % (ref 38.4–76.8)
PLATELETS: 134 10*3/uL — AB (ref 145–400)
RBC: 3.71 10*6/uL (ref 3.70–5.45)
RDW: 20 % — AB (ref 11.2–14.5)
WBC: 3.6 10*3/uL — ABNORMAL LOW (ref 3.9–10.3)
lymph#: 1 10*3/uL (ref 0.9–3.3)

## 2014-10-03 MED ORDER — DEXTROSE 5 % IV SOLN
80.0000 mg/m2 | Freq: Once | INTRAVENOUS | Status: AC
  Administered 2014-10-03: 138 mg via INTRAVENOUS
  Filled 2014-10-03: qty 23

## 2014-10-03 MED ORDER — DIPHENHYDRAMINE HCL 50 MG/ML IJ SOLN
50.0000 mg | Freq: Once | INTRAMUSCULAR | Status: AC
  Administered 2014-10-03: 50 mg via INTRAVENOUS

## 2014-10-03 MED ORDER — SODIUM CHLORIDE 0.9 % IV SOLN
Freq: Once | INTRAVENOUS | Status: AC
  Administered 2014-10-03: 10:00:00 via INTRAVENOUS

## 2014-10-03 MED ORDER — HYDROMORPHONE HCL 4 MG/ML IJ SOLN
INTRAMUSCULAR | Status: AC
  Filled 2014-10-03: qty 1

## 2014-10-03 MED ORDER — HYDROMORPHONE HCL 4 MG/ML IJ SOLN
2.0000 mg | Freq: Once | INTRAMUSCULAR | Status: AC
  Administered 2014-10-03: 2 mg via INTRAVENOUS

## 2014-10-03 MED ORDER — HEPARIN SOD (PORK) LOCK FLUSH 100 UNIT/ML IV SOLN
500.0000 [IU] | Freq: Once | INTRAVENOUS | Status: AC | PRN
  Administered 2014-10-03: 500 [IU]
  Filled 2014-10-03: qty 5

## 2014-10-03 MED ORDER — DEXAMETHASONE SODIUM PHOSPHATE 20 MG/5ML IJ SOLN
INTRAMUSCULAR | Status: AC
Start: 2014-10-03 — End: 2014-10-03
  Filled 2014-10-03: qty 5

## 2014-10-03 MED ORDER — FAMOTIDINE IN NACL 20-0.9 MG/50ML-% IV SOLN
20.0000 mg | Freq: Once | INTRAVENOUS | Status: AC
  Administered 2014-10-03: 20 mg via INTRAVENOUS

## 2014-10-03 MED ORDER — DIPHENHYDRAMINE HCL 50 MG/ML IJ SOLN
INTRAMUSCULAR | Status: AC
Start: 2014-10-03 — End: 2014-10-03
  Filled 2014-10-03: qty 1

## 2014-10-03 MED ORDER — SODIUM CHLORIDE 0.9 % IJ SOLN
10.0000 mL | INTRAMUSCULAR | Status: DC | PRN
  Administered 2014-10-03: 10 mL
  Filled 2014-10-03: qty 10

## 2014-10-03 MED ORDER — LORAZEPAM 0.5 MG PO TABS: ORAL_TABLET | ORAL | Status: DC

## 2014-10-03 MED ORDER — INSULIN REGULAR HUMAN 100 UNIT/ML IJ SOLN
4.0000 [IU] | Freq: Once | INTRAMUSCULAR | Status: AC
  Administered 2014-10-03: 4 [IU] via SUBCUTANEOUS
  Filled 2014-10-03: qty 0.04

## 2014-10-03 MED ORDER — DEXAMETHASONE SODIUM PHOSPHATE 20 MG/5ML IJ SOLN
20.0000 mg | Freq: Once | INTRAMUSCULAR | Status: AC
  Administered 2014-10-03: 20 mg via INTRAVENOUS

## 2014-10-03 MED ORDER — ONDANSETRON 8 MG/NS 50 ML IVPB
INTRAVENOUS | Status: AC
  Filled 2014-10-03: qty 8

## 2014-10-03 MED ORDER — ONDANSETRON 8 MG/50ML IVPB (CHCC)
8.0000 mg | Freq: Once | INTRAVENOUS | Status: AC
  Administered 2014-10-03: 8 mg via INTRAVENOUS

## 2014-10-03 MED ORDER — FAMOTIDINE IN NACL 20-0.9 MG/50ML-% IV SOLN
INTRAVENOUS | Status: AC
  Filled 2014-10-03: qty 50

## 2014-10-03 NOTE — Patient Instructions (Signed)
[facility] Discharge Instructions for Patients Receiving Chemotherapy  Today you received the following chemotherapy agents Taxol.  To help prevent nausea and vomiting after your treatment, we encourage you to take your nausea medication as directed.    If you develop nausea and vomiting that is not controlled by your nausea medication, call the clinic.   BELOW ARE SYMPTOMS THAT SHOULD BE REPORTED IMMEDIATELY:  *FEVER GREATER THAN 100.5 F  *CHILLS WITH OR WITHOUT FEVER  NAUSEA AND VOMITING THAT IS NOT CONTROLLED WITH YOUR NAUSEA MEDICATION  *UNUSUAL SHORTNESS OF BREATH  *UNUSUAL BRUISING OR BLEEDING  TENDERNESS IN MOUTH AND THROAT WITH OR WITHOUT PRESENCE OF ULCERS  *URINARY PROBLEMS  *BOWEL PROBLEMS  UNUSUAL RASH Items with * indicate a potential emergency and should be followed up as soon as possible.  Feel free to call the clinic you have any questions or concerns. The clinic phone number is [phone].    

## 2014-10-05 ENCOUNTER — Other Ambulatory Visit: Payer: Self-pay | Admitting: Oncology

## 2014-10-10 ENCOUNTER — Ambulatory Visit (HOSPITAL_BASED_OUTPATIENT_CLINIC_OR_DEPARTMENT_OTHER): Payer: Managed Care, Other (non HMO) | Admitting: Oncology

## 2014-10-10 ENCOUNTER — Other Ambulatory Visit (HOSPITAL_BASED_OUTPATIENT_CLINIC_OR_DEPARTMENT_OTHER): Payer: Managed Care, Other (non HMO)

## 2014-10-10 ENCOUNTER — Ambulatory Visit (HOSPITAL_BASED_OUTPATIENT_CLINIC_OR_DEPARTMENT_OTHER): Payer: Managed Care, Other (non HMO)

## 2014-10-10 ENCOUNTER — Encounter: Payer: Self-pay | Admitting: Oncology

## 2014-10-10 VITALS — BP 113/58 | HR 99 | Temp 98.2°F | Resp 18 | Ht 62.0 in | Wt 146.2 lb

## 2014-10-10 DIAGNOSIS — C561 Malignant neoplasm of right ovary: Secondary | ICD-10-CM

## 2014-10-10 DIAGNOSIS — C7961 Secondary malignant neoplasm of right ovary: Secondary | ICD-10-CM

## 2014-10-10 DIAGNOSIS — C7989 Secondary malignant neoplasm of other specified sites: Secondary | ICD-10-CM

## 2014-10-10 DIAGNOSIS — Z5111 Encounter for antineoplastic chemotherapy: Secondary | ICD-10-CM

## 2014-10-10 DIAGNOSIS — C801 Malignant (primary) neoplasm, unspecified: Secondary | ICD-10-CM

## 2014-10-10 DIAGNOSIS — B373 Candidiasis of vulva and vagina: Secondary | ICD-10-CM

## 2014-10-10 DIAGNOSIS — G9009 Other idiopathic peripheral autonomic neuropathy: Secondary | ICD-10-CM

## 2014-10-10 DIAGNOSIS — B3731 Acute candidiasis of vulva and vagina: Secondary | ICD-10-CM

## 2014-10-10 DIAGNOSIS — C252 Malignant neoplasm of tail of pancreas: Secondary | ICD-10-CM

## 2014-10-10 DIAGNOSIS — E119 Type 2 diabetes mellitus without complications: Secondary | ICD-10-CM

## 2014-10-10 DIAGNOSIS — I10 Essential (primary) hypertension: Secondary | ICD-10-CM

## 2014-10-10 LAB — CBC WITH DIFFERENTIAL/PLATELET
BASO%: 0.3 % (ref 0.0–2.0)
BASOS ABS: 0 10*3/uL (ref 0.0–0.1)
EOS%: 0.1 % (ref 0.0–7.0)
Eosinophils Absolute: 0 10*3/uL (ref 0.0–0.5)
HEMATOCRIT: 28.7 % — AB (ref 34.8–46.6)
HEMOGLOBIN: 9.4 g/dL — AB (ref 11.6–15.9)
LYMPH#: 0.5 10*3/uL — AB (ref 0.9–3.3)
LYMPH%: 27.2 % (ref 14.0–49.7)
MCH: 28.7 pg (ref 25.1–34.0)
MCHC: 32.7 g/dL (ref 31.5–36.0)
MCV: 87.9 fL (ref 79.5–101.0)
MONO#: 0 10*3/uL — ABNORMAL LOW (ref 0.1–0.9)
MONO%: 1.2 % (ref 0.0–14.0)
NEUT#: 1.4 10*3/uL — ABNORMAL LOW (ref 1.5–6.5)
NEUT%: 71.2 % (ref 38.4–76.8)
Platelets: 113 10*3/uL — ABNORMAL LOW (ref 145–400)
RBC: 3.26 10*6/uL — ABNORMAL LOW (ref 3.70–5.45)
RDW: 20.1 % — ABNORMAL HIGH (ref 11.2–14.5)
WBC: 1.9 10*3/uL — ABNORMAL LOW (ref 3.9–10.3)

## 2014-10-10 LAB — COMPREHENSIVE METABOLIC PANEL (CC13)
ALT: 12 U/L (ref 0–55)
AST: 10 U/L (ref 5–34)
Albumin: 3.1 g/dL — ABNORMAL LOW (ref 3.5–5.0)
Alkaline Phosphatase: 73 U/L (ref 40–150)
Anion Gap: 12 mEq/L — ABNORMAL HIGH (ref 3–11)
BUN: 11.7 mg/dL (ref 7.0–26.0)
CALCIUM: 9.1 mg/dL (ref 8.4–10.4)
CHLORIDE: 101 meq/L (ref 98–109)
CO2: 22 mEq/L (ref 22–29)
Creatinine: 0.7 mg/dL (ref 0.6–1.1)
Glucose: 216 mg/dl — ABNORMAL HIGH (ref 70–140)
Potassium: 4.7 mEq/L (ref 3.5–5.1)
Sodium: 134 mEq/L — ABNORMAL LOW (ref 136–145)
Total Bilirubin: 0.55 mg/dL (ref 0.20–1.20)
Total Protein: 6.2 g/dL — ABNORMAL LOW (ref 6.4–8.3)

## 2014-10-10 MED ORDER — DEXAMETHASONE SODIUM PHOSPHATE 20 MG/5ML IJ SOLN
INTRAMUSCULAR | Status: AC
  Filled 2014-10-10: qty 5

## 2014-10-10 MED ORDER — PACLITAXEL CHEMO INJECTION 300 MG/50ML
80.0000 mg/m2 | Freq: Once | INTRAVENOUS | Status: AC
  Administered 2014-10-10: 138 mg via INTRAVENOUS
  Filled 2014-10-10: qty 23

## 2014-10-10 MED ORDER — HYDROMORPHONE HCL 4 MG/ML IJ SOLN
1.0000 mg | Freq: Once | INTRAMUSCULAR | Status: AC | PRN
  Administered 2014-10-10: 1 mg via INTRAVENOUS

## 2014-10-10 MED ORDER — SODIUM CHLORIDE 0.9 % IV SOLN
Freq: Once | INTRAVENOUS | Status: AC
  Administered 2014-10-10: 10:00:00 via INTRAVENOUS

## 2014-10-10 MED ORDER — HYDROMORPHONE HCL 2 MG PO TABS: 2.0000 mg | ORAL_TABLET | ORAL | Status: DC | PRN

## 2014-10-10 MED ORDER — FERROUS FUMARATE 325 (106 FE) MG PO TABS: ORAL_TABLET | ORAL | Status: DC

## 2014-10-10 MED ORDER — ONDANSETRON 16 MG/50ML IVPB (CHCC)
16.0000 mg | Freq: Once | INTRAVENOUS | Status: AC
  Administered 2014-10-10: 16 mg via INTRAVENOUS

## 2014-10-10 MED ORDER — FAMOTIDINE IN NACL 20-0.9 MG/50ML-% IV SOLN
INTRAVENOUS | Status: AC
  Filled 2014-10-10: qty 50

## 2014-10-10 MED ORDER — FAMOTIDINE IN NACL 20-0.9 MG/50ML-% IV SOLN
20.0000 mg | Freq: Once | INTRAVENOUS | Status: AC
  Administered 2014-10-10: 20 mg via INTRAVENOUS

## 2014-10-10 MED ORDER — INSULIN REGULAR HUMAN 100 UNIT/ML IJ SOLN
4.0000 [IU] | Freq: Once | INTRAMUSCULAR | Status: AC
  Administered 2014-10-10: 4 [IU] via SUBCUTANEOUS
  Filled 2014-10-10: qty 0.04

## 2014-10-10 MED ORDER — SODIUM CHLORIDE 0.9 % IV SOLN
310.0000 mg | Freq: Once | INTRAVENOUS | Status: AC
  Administered 2014-10-10: 310 mg via INTRAVENOUS
  Filled 2014-10-10: qty 31

## 2014-10-10 MED ORDER — ONDANSETRON 16 MG/50ML IVPB (CHCC)
INTRAVENOUS | Status: AC
  Filled 2014-10-10: qty 16

## 2014-10-10 MED ORDER — HYDROMORPHONE HCL 4 MG/ML IJ SOLN
INTRAMUSCULAR | Status: AC
  Filled 2014-10-10: qty 1

## 2014-10-10 MED ORDER — DIPHENHYDRAMINE HCL 50 MG/ML IJ SOLN
INTRAMUSCULAR | Status: AC
  Filled 2014-10-10: qty 1

## 2014-10-10 MED ORDER — OXYMORPHONE HCL ER 10 MG PO TB12: ORAL_TABLET | ORAL | Status: DC

## 2014-10-10 MED ORDER — HEPARIN SOD (PORK) LOCK FLUSH 100 UNIT/ML IV SOLN
500.0000 [IU] | Freq: Once | INTRAVENOUS | Status: AC | PRN
  Administered 2014-10-10: 500 [IU]
  Filled 2014-10-10: qty 5

## 2014-10-10 MED ORDER — DEXAMETHASONE SODIUM PHOSPHATE 20 MG/5ML IJ SOLN
20.0000 mg | Freq: Once | INTRAMUSCULAR | Status: AC
  Administered 2014-10-10: 20 mg via INTRAVENOUS

## 2014-10-10 MED ORDER — SODIUM CHLORIDE 0.9 % IJ SOLN
10.0000 mL | INTRAMUSCULAR | Status: DC | PRN
  Administered 2014-10-10: 10 mL
  Filled 2014-10-10: qty 10

## 2014-10-10 MED ORDER — OXYMORPHONE HCL ER 20 MG PO TB12: 20.0000 mg | ORAL_TABLET | Freq: Two times a day (BID) | ORAL | Status: DC

## 2014-10-10 MED ORDER — DIPHENHYDRAMINE HCL 50 MG/ML IJ SOLN
25.0000 mg | Freq: Once | INTRAMUSCULAR | Status: AC
  Administered 2014-10-10: 25 mg via INTRAVENOUS

## 2014-10-10 NOTE — Patient Instructions (Signed)
[facility] Discharge Instructions for Patients Receiving Chemotherapy  Today you received the following chemotherapy agents: taxol, carboplatin  To help prevent nausea and vomiting after your treatment, we encourage you to take your nausea medication.  Take it as often as prescribed.     If you develop nausea and vomiting that is not controlled by your nausea medication, call the clinic. If it is after clinic hours your family physician or the after hours number for the clinic or go to the Emergency Department.   BELOW ARE SYMPTOMS THAT SHOULD BE REPORTED IMMEDIATELY:  *FEVER GREATER THAN 100.5 F  *CHILLS WITH OR WITHOUT FEVER  NAUSEA AND VOMITING THAT IS NOT CONTROLLED WITH YOUR NAUSEA MEDICATION  *UNUSUAL SHORTNESS OF BREATH  *UNUSUAL BRUISING OR BLEEDING  TENDERNESS IN MOUTH AND THROAT WITH OR WITHOUT PRESENCE OF ULCERS  *URINARY PROBLEMS  *BOWEL PROBLEMS  UNUSUAL RASH Items with * indicate a potential emergency and should be followed up as soon as possible.  Feel free to call the clinic you have any questions or concerns. The clinic phone number is [phone].   I have been informed and understand all the instructions given to me. I know to contact the clinic, my physician, or go to the Emergency Department if any problems should occur. I do not have any questions at this time, but understand that I may call the clinic during office hours   should I have any questions or need assistance in obtaining follow up care.    __________________________________________  _____________  __________ Signature of Patient or Authorized Representative            Date                   Time    __________________________________________ Nurse's Signature    

## 2014-10-10 NOTE — Progress Notes (Signed)
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 She is due day 1 cycle 4 today, carbo to be dose reduced due to lower counts today.  Patient has more nausea for first several days after each treatment, but has had no nausea for past 3+ days and has been eating "everything in the house". She continues oxymorphone 30 mg bid, but has generally needed dilaudid only twice daily now, for discomfort in low pelvis but no longer any epigastric to LUQ pain. She notices SOB walking up hill to office today and with other exertion. She denies bleeding. She is able to sleep. Bowels are moving.  PAC in.  Anaphylaxis to flu vaccine in past. Has script for Tamiflu available.  Husband was hospitalized at [facility] earlier this week for cardiac stent procedure.  ONCOLOGIC HISTORY Patient has history of cystitis 2 years ago which presented with suprapubic and back pain, resolved with macrobid x 3 months; CT then was not remarkable. She developed similar symptoms over several months recently and was seen by Dr Nila Nephew with finding of urinary retention. CT AP without contrast at [facility] 06-09-14 showed 9.5 x 14.8 x 10.5 cm complex cystic mass in central pelvis with uterus surgically absent, no adenopathy abdomen or pelvic sidewall, apparent thickening tail of pancreas and question of peripancreatic stranding, 2.5 x 1.8 cm low density structure adjacent to right atrium. She was seen by Dr Ellouise Newer on 06-11-14, with pelvic mass on exam, CA 125 152 and CEA 3.28 both  on 06-11-14. She was seen by Dr Denman George on 06-16-14, at which time she reported 10 lb weight loss in 2 weeks, early satiety, increased constipation and pain; her exam revealed large smooth pelvic mass filling pelvis, adherent to vagina and in close approximation to rectum, minimally mobile. CA 19-9 from 06-16-14 was 224.7. She had MRI abdomen at [facility] on 06-26-14 with findings of abnormal rounded/ truncated appearance of distal pancreatic body/tail with peripancreatic fluid/ inflammatory changes new compared with scans of 2013, associated splenic vein thrombosis, small volume ascites, no suspicious adenopathy, and probable benign pericardial cyst. She had exploratory laparotomy with right salpingo-oophorectomy, omentectomy and radical debulking of ovarian cancer by Dr Denman George at [facility] on 07-01-14. Intraoperative findings were of 20 cm cystic mass arising from right ovary, filling pelvis centrally and adherent to sigmoid colon, 200 cc ascites, peritoneal tumor plaques anterior pelvis and right diaphragm with TNTC 1 cm nodules small bowel mesentery and bowel wall; enlarged pancreatic body was described as rigid and abnormally firm to palpation. At completion of surgery there was residual tumor at the mesentery of the small and large intestine, thin tumor plaques on the diaphragm and pelvic peritoneum, and gallbladder serosa and pouch of Randol Kern representing an incomplete and suboptimal cytoreduction.  Pathology [id number] from 07-01-14 showed 16 x 11 x 7 cm mucinous cystadenocarcinoma of right ovary with capsule intact, tumor on surface of ovary and identical tumor involving omentum and peritoneal biopsies, no nodes evaluated, for pT3bpNX/ FIGO IIIB. This pathology was reviewed by 3 pathologists in [facility] system. Patient was stable for DC home on 07-03-14. Within 24 hours after discharge she  developed nausea, vomiting and abdominal pain, requiring readmission from 7-25 thru 07-15-14 for SBO and mild pancreatitis.  She was managed conservatively in hospital, followed by Dr Denman George, did require NG decompression and support with TNA via PICC. Repeat CT AP was negative for high grade bowel obstruction or transition point; PICC was removed prior to DC home and diabetes was managed with SS insulin. Lovenox was continued in hospital but was not resumed at DC (initially planned x 4 weeks post op). She had endoscopic [place] with biopsy of mass in tail of pancreas by Dr Paulita Fujita on 07-30-14, path confirming mucinous cystadenocarcinoma involvement there. She began dose dense carboplatin taxol on 08-08-14.     Review of systems as above, also: No cough or chest pain. No LE swelling. No bladder symptoms. No symptomatic worsening of chronic peripheral neuropathy with taxol thus far. Remainder of 10 point Review of Systems negative.  Objective:  Vital signs in last 24 hours:  BP 113/58  Pulse 99  Temp(Src) 98.2 F (36.8 C) (Oral)  Resp 18  Ht 5\' 2"  (1.575 m)  Wt 146 lb 3.2 oz (66.316 kg)  BMI 26.73 kg/m2 Respirations not labored RA. Weight is up 6 lbs.  Alert, oriented and appropriate. Ambulatory without difficulty.  Alopecia  HEENT:PERRL, sclerae not icteric. Oral mucosa moist without lesions, posterior pharynx clear.  Neck supple. No JVD.  Lymphatics:no cervical,suraclavicular or inguinal adenopathy Resp: clear to auscultation bilaterally and normal percussion bilaterally Cardio: regular rate and rhythm. No gallop. GI: soft, nontender, not distended, no mass or organomegaly. Normally active bowel sounds.  Musculoskeletal/ Extremities: without pitting edema, cords, tenderness Neuro: no peripheral neuropathy. Otherwise nonfocal. PSYCH appropriate mood and affect Skin without rash, ecchymosis, petechiae Portacath-without erythema or tenderness  Lab Results:  Results for orders placed in visit on 10/10/14  CBC WITH DIFFERENTIAL      Result Value Ref Range   WBC 1.9 (*) 3.9 - 10.3 10e3/uL   NEUT# 1.4 (*) 1.5 -  6.5 10e3/uL   HGB 9.4 (*) 11.6 - 15.9 g/dL   HCT 28.7 (*) 34.8 - 46.6 %   Platelets 113 (*) 145 - 400 10e3/uL   MCV 87.9  79.5 - 101.0 fL   MCH 28.7  25.1 - 34.0 pg   MCHC 32.7  31.5 - 36.0 g/dL   RBC 3.26 (*) 3.70 - 5.45 10e6/uL   RDW 20.1 (*) 11.2 - 14.5 %   lymph# 0.5 (*) 0.9 - 3.3 10e3/uL   MONO# 0.0 (*) 0.1 - 0.9 10e3/uL   Eosinophils Absolute 0.0  0.0 - 0.5 10e3/uL   Basophils Absolute 0.0  0.0 - 0.1 10e3/uL   NEUT% 71.2  38.4 - 76.8 %   LYMPH% 27.2  14.0 - 49.7 %   MONO% 1.2  0.0 - 14.0 %   EOS% 0.1  0.0 - 7.0 %   BASO% 0.3  0.0 - 2.0 %  COMPREHENSIVE METABOLIC PANEL (NO03)      Result Value Ref Range   Sodium 134 (*) 136 - 145 mEq/L   Potassium 4.7  3.5 - 5.1 mEq/L   Chloride 101  98 - 109 mEq/L   CO2 22  22 - 29 mEq/L   Glucose 216 (*) 70 - 140 mg/dl   BUN 11.7  7.0 - 26.0 mg/dL   Creatinine 0.7  0.6 - 1.1 mg/dL   Total Bilirubin 0.55  0.20 - 1.20 mg/dL   Alkaline Phosphatase 73  40 - 150 U/L   AST 10  5 -  34 U/L   ALT 12  0 - 55 U/L   Total Protein 6.2 (*) 6.4 - 8.3 g/dL   Albumin 3.1 (*) 3.5 - 5.0 g/dL   Calcium 9.1  8.4 - 10.4 mg/dL   Anion Gap 12 (*) 3 - 11 mEq/L   Markers available after visit: CA 125 now 37, down from 64 on 09-19-14 and 114 on 9-18 CA 19-9 now normal at 21.1, this having been 203 on 08-06-14.  Studies/Results:  No results found.  Medications: I have reviewed the patient's current medications. Carboplatin dose decreased to AUC = 3 today due to counts. She will use neupogen at home 10-31 and 11-1. She understands that we may need to adjust day 8 and/or day 15 this cycle depending on counts. Add oral iron either as prescription Hemocyte or ferrous fumarate/ gluconate - written and oral instructions given.  Benadryl with chemo premeds decreased to 25 mg, which can be either po or IV. Prescriptions for pain medications given. As she had run out of dilaudid so did not take this as usual this AM, will give IV with chemo today.  DISCUSSION: patient  would like to return to work as [employer]'s [employer], however I have told her that I do not think this is best for her care for now. We have discussed [place] and platelets lower today, with consideration of delaying day 1 cycle 4 until next week vs dose reducing carbo today. She does not want to delay treatment, will take neupogen at home days 2 and 3. Counts may not allow treatment for day 8 next week.  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 Dose dense carbo taxol begun 8-28, Neupogen at home. Day 1 cycle 4 today with carbo dose reduced to AUC =3 due to [place] just 1.4 and platelets 113k; she understands that counts may not be adequate for day 8 / day 15 this cycle since we did not delay treatment today. Foundation One testing sent, however I have still not seen those results. 2. Diabetes: on oral agent prior to this diagnosis. Following blood sugars here and should check at home particularly with variable po intake and decadron with taxol, discussed again.  3.SBO postoperatively: resolved with conservative care. 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 Not worse with taxol thus far  5..post remote hysterectomy and left oophorectomy  6.HTN and left BBB, known to cardiology in [place]  7.mammograms 08-04-14 [facility], dense tissue otherwise ok  8.bilateral knee surgeries, appendectomy, left frontal meningioma, hx cystitis  9.multiple drug allergies, including anaphylaxis to flu vaccine  10.vaginal yeast resolved with diflucan  11.Does not have advance directives  12. PAC in   Chemo orders adjusted; discussed carbo dosing with pharmacist. We will let her know markers as above.  I will see her again Nov 13 and will address counts in interim as needed. she knows to call prior to scheduled appointments if needed    Gordy Levan, MD   10/10/2014, 8:58 AM

## 2014-10-10 NOTE — Patient Instructions (Signed)
Begin oral iron, either as prescription Hemocyte (if your insurance will cover) otherwise ferrous fumarate or ferrous gluconate (no substitute), as these are easiest on GI tract. Best absorption is on empty stomach with OJ or vitamin C tablet.  So - one daily ~ an hour before meal or 2 hrs after meal with OJ or with vit C tablet

## 2014-10-11 ENCOUNTER — Other Ambulatory Visit: Payer: Self-pay | Admitting: Oncology

## 2014-10-11 LAB — CANCER ANTIGEN 19-9: CA 19 9: 21.1 U/mL (ref <35.0)

## 2014-10-11 LAB — CA 125: CA 125: 37 U/mL — AB (ref <35)

## 2014-10-13 ENCOUNTER — Telehealth: Payer: Self-pay

## 2014-10-13 ENCOUNTER — Telehealth: Payer: Self-pay | Admitting: Oncology

## 2014-10-13 NOTE — Telephone Encounter (Signed)
s.w. pt and advised on NOV appts....pt ok adn aware

## 2014-10-13 NOTE — Telephone Encounter (Signed)
-----   Message from Gordy Levan, MD sent at 10/11/2014 10:07 AM EDT ----- 1. Please let her know ca19-9 is down to normal range at 21 and CA 125 is down to 37 by labs 10-30  2.Please be sure she gets either Hemocyte as prescription if insurance will cover, or ferrous fumarate/ gluconate no substitute  One daily on empty stomach with OJ   #30 5 RF if script  3.Please have her take one additional day of neupogen at home for total 3 days if she has that much, due to [place] just 1.4 when we treated 10-30. Avoid crowds. Call if bleeding or excessive bruising, or increased SOB (all counts lower).  4. On 11-3 or 11-5, please call [facility] pathology and request results of Foundation One testing, that I think was to be sent shortly after I first saw Mrs.Marlett. Patient did not ask about this, I just realized that I have not gotten results as far as I can tell in EMR.  thanks

## 2014-10-13 NOTE — Telephone Encounter (Signed)
Spoke with Kristin Meadows regarding the tumor markers and taking an additional dose of Neupogen today for a total of 3 doses as noted below and reviewed symptoms that should be called  Dr. Mariana Kaufman office. Kristin Meadows received ferrous gluconate and is taking as directed.

## 2014-10-15 ENCOUNTER — Other Ambulatory Visit: Payer: Self-pay | Admitting: Oncology

## 2014-10-17 ENCOUNTER — Other Ambulatory Visit (HOSPITAL_BASED_OUTPATIENT_CLINIC_OR_DEPARTMENT_OTHER): Payer: Managed Care, Other (non HMO)

## 2014-10-17 ENCOUNTER — Ambulatory Visit (HOSPITAL_BASED_OUTPATIENT_CLINIC_OR_DEPARTMENT_OTHER): Payer: Managed Care, Other (non HMO)

## 2014-10-17 ENCOUNTER — Other Ambulatory Visit: Payer: Self-pay | Admitting: *Deleted

## 2014-10-17 VITALS — BP 100/48 | HR 91 | Temp 98.2°F | Resp 18

## 2014-10-17 DIAGNOSIS — C561 Malignant neoplasm of right ovary: Secondary | ICD-10-CM

## 2014-10-17 DIAGNOSIS — B370 Candidal stomatitis: Secondary | ICD-10-CM

## 2014-10-17 DIAGNOSIS — C801 Malignant (primary) neoplasm, unspecified: Secondary | ICD-10-CM

## 2014-10-17 DIAGNOSIS — Z5111 Encounter for antineoplastic chemotherapy: Secondary | ICD-10-CM

## 2014-10-17 DIAGNOSIS — E119 Type 2 diabetes mellitus without complications: Secondary | ICD-10-CM

## 2014-10-17 DIAGNOSIS — C7989 Secondary malignant neoplasm of other specified sites: Secondary | ICD-10-CM

## 2014-10-17 DIAGNOSIS — C7961 Secondary malignant neoplasm of right ovary: Secondary | ICD-10-CM

## 2014-10-17 LAB — CBC WITH DIFFERENTIAL/PLATELET
BASO%: 0.2 % (ref 0.0–2.0)
Basophils Absolute: 0 10*3/uL (ref 0.0–0.1)
EOS%: 0 % (ref 0.0–7.0)
Eosinophils Absolute: 0 10*3/uL (ref 0.0–0.5)
HEMATOCRIT: 28.7 % — AB (ref 34.8–46.6)
HGB: 9.4 g/dL — ABNORMAL LOW (ref 11.6–15.9)
LYMPH%: 22.6 % (ref 14.0–49.7)
MCH: 29 pg (ref 25.1–34.0)
MCHC: 32.8 g/dL (ref 31.5–36.0)
MCV: 88.6 fL (ref 79.5–101.0)
MONO#: 0.2 10*3/uL (ref 0.1–0.9)
MONO%: 3.3 % (ref 0.0–14.0)
NEUT#: 3.6 10*3/uL (ref 1.5–6.5)
NEUT%: 73.9 % (ref 38.4–76.8)
PLATELETS: 167 10*3/uL (ref 145–400)
RBC: 3.24 10*6/uL — ABNORMAL LOW (ref 3.70–5.45)
RDW: 19 % — ABNORMAL HIGH (ref 11.2–14.5)
WBC: 4.8 10*3/uL (ref 3.9–10.3)
lymph#: 1.1 10*3/uL (ref 0.9–3.3)

## 2014-10-17 LAB — COMPREHENSIVE METABOLIC PANEL (CC13)
ALT: 12 U/L (ref 0–55)
AST: 11 U/L (ref 5–34)
Albumin: 3.1 g/dL — ABNORMAL LOW (ref 3.5–5.0)
Alkaline Phosphatase: 78 U/L (ref 40–150)
Anion Gap: 11 mEq/L (ref 3–11)
BILIRUBIN TOTAL: 0.51 mg/dL (ref 0.20–1.20)
BUN: 12.8 mg/dL (ref 7.0–26.0)
CO2: 24 mEq/L (ref 22–29)
CREATININE: 0.7 mg/dL (ref 0.6–1.1)
Calcium: 9.2 mg/dL (ref 8.4–10.4)
Chloride: 99 mEq/L (ref 98–109)
GLUCOSE: 214 mg/dL — AB (ref 70–140)
Potassium: 4.8 mEq/L (ref 3.5–5.1)
Sodium: 134 mEq/L — ABNORMAL LOW (ref 136–145)
Total Protein: 6.3 g/dL — ABNORMAL LOW (ref 6.4–8.3)

## 2014-10-17 LAB — WHOLE BLOOD GLUCOSE
Glucose: 204 mg/dL — ABNORMAL HIGH (ref 70–100)
HRS PC: 1.5 Hours

## 2014-10-17 MED ORDER — DIPHENHYDRAMINE HCL 50 MG/ML IJ SOLN
25.0000 mg | Freq: Once | INTRAMUSCULAR | Status: AC
  Administered 2014-10-17: 25 mg via INTRAVENOUS

## 2014-10-17 MED ORDER — ONDANSETRON 8 MG/50ML IVPB (CHCC)
8.0000 mg | Freq: Once | INTRAVENOUS | Status: AC
  Administered 2014-10-17: 8 mg via INTRAVENOUS

## 2014-10-17 MED ORDER — INSULIN REGULAR HUMAN 100 UNIT/ML IJ SOLN
2.0000 [IU] | Freq: Once | INTRAMUSCULAR | Status: AC
Start: 2014-10-17 — End: 2014-10-17
  Administered 2014-10-17: 4 [IU] via SUBCUTANEOUS
  Filled 2014-10-17: qty 0.08

## 2014-10-17 MED ORDER — FAMOTIDINE IN NACL 20-0.9 MG/50ML-% IV SOLN
20.0000 mg | Freq: Once | INTRAVENOUS | Status: AC
  Administered 2014-10-17: 20 mg via INTRAVENOUS

## 2014-10-17 MED ORDER — PACLITAXEL CHEMO INJECTION 300 MG/50ML
80.0000 mg/m2 | Freq: Once | INTRAVENOUS | Status: AC
  Administered 2014-10-17: 138 mg via INTRAVENOUS
  Filled 2014-10-17: qty 23

## 2014-10-17 MED ORDER — FLUCONAZOLE 100 MG PO TABS: 100.0000 mg | ORAL_TABLET | Freq: Every day | ORAL | Status: DC

## 2014-10-17 MED ORDER — SODIUM CHLORIDE 0.9 % IJ SOLN
10.0000 mL | INTRAMUSCULAR | Status: DC | PRN
  Administered 2014-10-17: 10 mL
  Filled 2014-10-17: qty 10

## 2014-10-17 MED ORDER — DIPHENHYDRAMINE HCL 50 MG/ML IJ SOLN
INTRAMUSCULAR | Status: AC
  Filled 2014-10-17: qty 1

## 2014-10-17 MED ORDER — PROMETHAZINE HCL 25 MG PO TABS: 25.0000 mg | ORAL_TABLET | Freq: Three times a day (TID) | ORAL | Status: DC | PRN

## 2014-10-17 MED ORDER — ONDANSETRON 8 MG/NS 50 ML IVPB
INTRAVENOUS | Status: AC
  Filled 2014-10-17: qty 8

## 2014-10-17 MED ORDER — FAMOTIDINE IN NACL 20-0.9 MG/50ML-% IV SOLN
INTRAVENOUS | Status: AC
Start: 2014-10-17 — End: 2014-10-17
  Filled 2014-10-17: qty 50

## 2014-10-17 MED ORDER — DEXAMETHASONE SODIUM PHOSPHATE 20 MG/5ML IJ SOLN
INTRAMUSCULAR | Status: AC
  Filled 2014-10-17: qty 5

## 2014-10-17 MED ORDER — SODIUM CHLORIDE 0.9 % IV SOLN
Freq: Once | INTRAVENOUS | Status: AC
  Administered 2014-10-17: 10:00:00 via INTRAVENOUS

## 2014-10-17 MED ORDER — DEXAMETHASONE SODIUM PHOSPHATE 20 MG/5ML IJ SOLN
20.0000 mg | Freq: Once | INTRAMUSCULAR | Status: AC
  Administered 2014-10-17: 20 mg via INTRAVENOUS

## 2014-10-17 MED ORDER — SODIUM CHLORIDE 0.9 % IV SOLN
1000.0000 mL | Freq: Once | INTRAVENOUS | Status: AC
  Administered 2014-10-17: 11:00:00 via INTRAVENOUS

## 2014-10-17 MED ORDER — HEPARIN SOD (PORK) LOCK FLUSH 100 UNIT/ML IV SOLN
500.0000 [IU] | Freq: Once | INTRAVENOUS | Status: AC | PRN
  Administered 2014-10-17: 500 [IU]
  Filled 2014-10-17: qty 5

## 2014-10-17 MED ORDER — LORAZEPAM 0.5 MG PO TABS: ORAL_TABLET | ORAL | Status: DC

## 2014-10-17 NOTE — Patient Instructions (Signed)
[facility] Discharge Instructions for Patients Receiving Chemotherapy  Today you received the following chemotherapy agents: Taxol.  To help prevent nausea and vomiting after your treatment, we encourage you to take your nausea medication: Phenergan 25 mg every 6 hours as needed. Zofran 8 mg every 12 hours as needed.   If you develop nausea and vomiting that is not controlled by your nausea medication, call the clinic.   BELOW ARE SYMPTOMS THAT SHOULD BE REPORTED IMMEDIATELY:  *FEVER GREATER THAN 100.5 F  *CHILLS WITH OR WITHOUT FEVER  NAUSEA AND VOMITING THAT IS NOT CONTROLLED WITH YOUR NAUSEA MEDICATION  *UNUSUAL SHORTNESS OF BREATH  *UNUSUAL BRUISING OR BLEEDING  TENDERNESS IN MOUTH AND THROAT WITH OR WITHOUT PRESENCE OF ULCERS  *URINARY PROBLEMS  *BOWEL PROBLEMS  UNUSUAL RASH Items with * indicate a potential emergency and should be followed up as soon as possible.  Feel free to call the clinic you have any questions or concerns. The clinic phone number is [phone].

## 2014-10-17 NOTE — Progress Notes (Signed)
Patient reporting soreness to soft palate and "white stuff" on tongue that she is able to brush off with tooth brush. Patient also requesting refills of phenergan and ativan. Dr. Marko Plume notified of last refill 10/03/14 and that patient has 1 tablet remaining. OK for desk RN to refill these meds with addition of diflucan. Barbaraann Share, RN notified. Patient informed of the above as well as clarified decadron instructions. Patient verbalizes understanding.

## 2014-10-17 NOTE — Progress Notes (Signed)
BP somewhat low this am. Pt given additional 537mls .9NS per Dr. Marko Plume standing orders.  Tolerated well

## 2014-10-19 ENCOUNTER — Other Ambulatory Visit: Payer: Self-pay | Admitting: Oncology

## 2014-10-19 DIAGNOSIS — C252 Malignant neoplasm of tail of pancreas: Secondary | ICD-10-CM

## 2014-10-19 DIAGNOSIS — C561 Malignant neoplasm of right ovary: Secondary | ICD-10-CM

## 2014-10-21 ENCOUNTER — Other Ambulatory Visit: Payer: Self-pay | Admitting: *Deleted

## 2014-10-21 NOTE — Telephone Encounter (Signed)
Refill request for Etodolac ER 600 to collaborative nurse desk to review w/MD.

## 2014-10-24 ENCOUNTER — Ambulatory Visit: Payer: Managed Care, Other (non HMO)

## 2014-10-24 ENCOUNTER — Encounter: Payer: Self-pay | Admitting: Oncology

## 2014-10-24 ENCOUNTER — Telehealth: Payer: Self-pay | Admitting: *Deleted

## 2014-10-24 ENCOUNTER — Ambulatory Visit (HOSPITAL_BASED_OUTPATIENT_CLINIC_OR_DEPARTMENT_OTHER): Payer: Managed Care, Other (non HMO)

## 2014-10-24 ENCOUNTER — Other Ambulatory Visit (HOSPITAL_BASED_OUTPATIENT_CLINIC_OR_DEPARTMENT_OTHER): Payer: Managed Care, Other (non HMO)

## 2014-10-24 ENCOUNTER — Ambulatory Visit (HOSPITAL_BASED_OUTPATIENT_CLINIC_OR_DEPARTMENT_OTHER): Payer: Managed Care, Other (non HMO) | Admitting: Oncology

## 2014-10-24 VITALS — BP 105/65 | HR 97 | Temp 98.0°F | Resp 18 | Ht 62.0 in | Wt 140.4 lb

## 2014-10-24 DIAGNOSIS — C7961 Secondary malignant neoplasm of right ovary: Secondary | ICD-10-CM

## 2014-10-24 DIAGNOSIS — C801 Malignant (primary) neoplasm, unspecified: Secondary | ICD-10-CM

## 2014-10-24 DIAGNOSIS — C561 Malignant neoplasm of right ovary: Secondary | ICD-10-CM

## 2014-10-24 DIAGNOSIS — Z5111 Encounter for antineoplastic chemotherapy: Secondary | ICD-10-CM

## 2014-10-24 DIAGNOSIS — C252 Malignant neoplasm of tail of pancreas: Secondary | ICD-10-CM

## 2014-10-24 DIAGNOSIS — C7989 Secondary malignant neoplasm of other specified sites: Secondary | ICD-10-CM

## 2014-10-24 DIAGNOSIS — B373 Candidiasis of vulva and vagina: Secondary | ICD-10-CM

## 2014-10-24 DIAGNOSIS — E119 Type 2 diabetes mellitus without complications: Secondary | ICD-10-CM

## 2014-10-24 DIAGNOSIS — B3731 Acute candidiasis of vulva and vagina: Secondary | ICD-10-CM

## 2014-10-24 LAB — COMPREHENSIVE METABOLIC PANEL (CC13)
ALBUMIN: 3.3 g/dL — AB (ref 3.5–5.0)
ALT: 10 U/L (ref 0–55)
AST: 10 U/L (ref 5–34)
Alkaline Phosphatase: 79 U/L (ref 40–150)
Anion Gap: 11 mEq/L (ref 3–11)
BUN: 15.1 mg/dL (ref 7.0–26.0)
CALCIUM: 9.7 mg/dL (ref 8.4–10.4)
CHLORIDE: 98 meq/L (ref 98–109)
CO2: 25 mEq/L (ref 22–29)
CREATININE: 0.7 mg/dL (ref 0.6–1.1)
GLUCOSE: 214 mg/dL — AB (ref 70–140)
Potassium: 5.1 mEq/L (ref 3.5–5.1)
Sodium: 134 mEq/L — ABNORMAL LOW (ref 136–145)
Total Bilirubin: 0.57 mg/dL (ref 0.20–1.20)
Total Protein: 6.4 g/dL (ref 6.4–8.3)

## 2014-10-24 LAB — CBC WITH DIFFERENTIAL/PLATELET
BASO%: 0.3 % (ref 0.0–2.0)
Basophils Absolute: 0 10*3/uL (ref 0.0–0.1)
EOS ABS: 0 10*3/uL (ref 0.0–0.5)
EOS%: 0 % (ref 0.0–7.0)
HCT: 30 % — ABNORMAL LOW (ref 34.8–46.6)
HEMOGLOBIN: 9.9 g/dL — AB (ref 11.6–15.9)
LYMPH%: 21.2 % (ref 14.0–49.7)
MCH: 29.4 pg (ref 25.1–34.0)
MCHC: 32.8 g/dL (ref 31.5–36.0)
MCV: 89.6 fL (ref 79.5–101.0)
MONO#: 0 10*3/uL — AB (ref 0.1–0.9)
MONO%: 0.8 % (ref 0.0–14.0)
NEUT%: 77.7 % — ABNORMAL HIGH (ref 38.4–76.8)
NEUTROS ABS: 3.9 10*3/uL (ref 1.5–6.5)
Platelets: 180 10*3/uL (ref 145–400)
RBC: 3.35 10*6/uL — ABNORMAL LOW (ref 3.70–5.45)
RDW: 21 % — AB (ref 11.2–14.5)
WBC: 5 10*3/uL (ref 3.9–10.3)
lymph#: 1.1 10*3/uL (ref 0.9–3.3)

## 2014-10-24 LAB — WHOLE BLOOD GLUCOSE
GLUCOSE: 195 mg/dL — AB (ref 70–100)
HRS PC: 2 h

## 2014-10-24 MED ORDER — ONDANSETRON 8 MG/NS 50 ML IVPB
INTRAVENOUS | Status: AC
  Filled 2014-10-24: qty 8

## 2014-10-24 MED ORDER — FAMOTIDINE IN NACL 20-0.9 MG/50ML-% IV SOLN
20.0000 mg | Freq: Once | INTRAVENOUS | Status: AC
  Administered 2014-10-24: 20 mg via INTRAVENOUS

## 2014-10-24 MED ORDER — SODIUM CHLORIDE 0.9 % IV SOLN
Freq: Once | INTRAVENOUS | Status: AC
  Administered 2014-10-24: 10:00:00 via INTRAVENOUS

## 2014-10-24 MED ORDER — DIPHENHYDRAMINE HCL 50 MG/ML IJ SOLN
INTRAMUSCULAR | Status: AC
Start: 2014-10-24 — End: 2014-10-24
  Filled 2014-10-24: qty 1

## 2014-10-24 MED ORDER — PACLITAXEL CHEMO INJECTION 300 MG/50ML
80.0000 mg/m2 | Freq: Once | INTRAVENOUS | Status: AC
  Administered 2014-10-24: 138 mg via INTRAVENOUS
  Filled 2014-10-24: qty 23

## 2014-10-24 MED ORDER — DEXAMETHASONE SODIUM PHOSPHATE 20 MG/5ML IJ SOLN
20.0000 mg | Freq: Once | INTRAMUSCULAR | Status: AC
  Administered 2014-10-24: 20 mg via INTRAVENOUS

## 2014-10-24 MED ORDER — DIPHENHYDRAMINE HCL 50 MG/ML IJ SOLN
50.0000 mg | Freq: Once | INTRAMUSCULAR | Status: AC
  Administered 2014-10-24: 50 mg via INTRAVENOUS

## 2014-10-24 MED ORDER — FAMOTIDINE IN NACL 20-0.9 MG/50ML-% IV SOLN
INTRAVENOUS | Status: AC
  Filled 2014-10-24: qty 50

## 2014-10-24 MED ORDER — DEXAMETHASONE SODIUM PHOSPHATE 20 MG/5ML IJ SOLN
INTRAMUSCULAR | Status: AC
  Filled 2014-10-24: qty 5

## 2014-10-24 MED ORDER — HYDROMORPHONE HCL 2 MG PO TABS: 2.0000 mg | ORAL_TABLET | ORAL | Status: DC | PRN

## 2014-10-24 MED ORDER — HEPARIN SOD (PORK) LOCK FLUSH 100 UNIT/ML IV SOLN
500.0000 [IU] | Freq: Once | INTRAVENOUS | Status: AC | PRN
  Administered 2014-10-24: 500 [IU]
  Filled 2014-10-24: qty 5

## 2014-10-24 MED ORDER — INSULIN REGULAR HUMAN 100 UNIT/ML IJ SOLN
2.0000 [IU] | Freq: Once | INTRAMUSCULAR | Status: AC
  Administered 2014-10-24: 4 [IU] via SUBCUTANEOUS
  Filled 2014-10-24: qty 0.02

## 2014-10-24 MED ORDER — ONDANSETRON 8 MG/50ML IVPB (CHCC)
8.0000 mg | Freq: Once | INTRAVENOUS | Status: AC
  Administered 2014-10-24: 8 mg via INTRAVENOUS

## 2014-10-24 MED ORDER — SODIUM CHLORIDE 0.9 % IJ SOLN
10.0000 mL | INTRAMUSCULAR | Status: DC | PRN
  Administered 2014-10-24: 10 mL
  Filled 2014-10-24: qty 10

## 2014-10-24 MED ORDER — LORAZEPAM 0.5 MG PO TABS: ORAL_TABLET | ORAL | Status: DC

## 2014-10-24 NOTE — Patient Instructions (Signed)
[facility] Discharge Instructions for Patients Receiving Chemotherapy  Today you received the following chemotherapy agents Taxol.  To help prevent nausea and vomiting after your treatment, we encourage you to take your nausea medication as directed.    If you develop nausea and vomiting that is not controlled by your nausea medication, call the clinic.   BELOW ARE SYMPTOMS THAT SHOULD BE REPORTED IMMEDIATELY:  *FEVER GREATER THAN 100.5 F  *CHILLS WITH OR WITHOUT FEVER  NAUSEA AND VOMITING THAT IS NOT CONTROLLED WITH YOUR NAUSEA MEDICATION  *UNUSUAL SHORTNESS OF BREATH  *UNUSUAL BRUISING OR BLEEDING  TENDERNESS IN MOUTH AND THROAT WITH OR WITHOUT PRESENCE OF ULCERS  *URINARY PROBLEMS  *BOWEL PROBLEMS  UNUSUAL RASH Items with * indicate a potential emergency and should be followed up as soon as possible.  Feel free to call the clinic you have any questions or concerns. The clinic phone number is [phone].    

## 2014-10-24 NOTE — Telephone Encounter (Signed)
Per staff message and POF I have scheduled appts. Advised scheduler of appts, and to move labs. JMW  

## 2014-10-24 NOTE — Progress Notes (Signed)
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 Carbo dose was reduced on day 1 cycle 4 due to lower ANC and platelets, counts ok since then. She is due day 15 cycle 4 today, and does use neupogen at home after each treatment. Last imaging was CT 07-11-14. CA 125 was down to 37 on 10-10-14 and CA 19-9 down to 21 on the same day.  Patient had a difficult day on 11-10 with nausea/ vomiting, aches and weakness, but otherwise has felt fairly well this week. She has slight increase in neuropathy in feet (preexisting numbness on tops of both feet). She had more aches from taxol/ gCSF, has not tried claritin for this but recalls that suggestion. She prefers 50mg  dose of benadryl with chemo, as she prefers to be very drowsy - orders changed back now. She is still not checking blood sugars at home, including not on the day that she felt so badly. I have again requested that she check these at least occasionally. I have reminded her not to take metformin if she is unable to eat.  She has PAC. Anaphylaxis with flu vaccine in past.     ONCOLOGIC HISTORY Patient has history of cystitis 2 years ago which presented with suprapubic and back pain, resolved with macrobid x 3 months; CT then was not remarkable. She developed similar symptoms over several months recently and was seen by Dr Nila Nephew with finding of urinary retention. CT AP without contrast at [facility] 06-09-14 showed 9.5 x 14.8 x 10.5 cm complex cystic mass in central  pelvis with uterus surgically absent, no adenopathy abdomen or pelvic sidewall, apparent thickening tail of pancreas and question of peripancreatic stranding, 2.5 x 1.8 cm low density structure adjacent to right atrium. She was seen by Dr Ellouise Newer on 06-11-14, with pelvic mass on exam, CA 125 152 and CEA 3.28 both on 06-11-14. She was seen by Dr Denman George on 06-16-14, at which time she reported 10 lb weight loss in 2 weeks, early satiety, increased constipation and pain; her exam revealed large smooth pelvic mass filling pelvis, adherent to vagina and in close approximation to rectum, minimally mobile. CA 19-9 from 06-16-14 was 224.7. She had MRI abdomen at [facility] on 06-26-14 with findings of abnormal rounded/ truncated appearance of distal pancreatic body/tail with peripancreatic fluid/ inflammatory changes new compared with scans of 2013, associated splenic vein thrombosis, small volume ascites, no suspicious adenopathy, and probable benign pericardial cyst. She had exploratory laparotomy with right salpingo-oophorectomy, omentectomy and radical debulking of ovarian cancer by Dr Denman George at [facility] on 07-01-14. Intraoperative findings were of 20 cm cystic mass arising from right ovary, filling pelvis centrally and adherent to sigmoid colon, 200 cc ascites, peritoneal tumor plaques anterior pelvis and right diaphragm with TNTC 1 cm nodules small bowel mesentery and bowel wall; enlarged pancreatic body was described as rigid and abnormally firm to palpation. At completion of surgery there was residual tumor at the mesentery of the small and large intestine, thin tumor plaques on the diaphragm and pelvic peritoneum, and gallbladder serosa and pouch of Randol Kern representing an incomplete and suboptimal cytoreduction.  Pathology [id number] from 07-01-14 showed 16 x 11 x 7 cm mucinous cystadenocarcinoma of right ovary with capsule intact, tumor on surface of ovary and identical tumor involving omentum and peritoneal  biopsies, no nodes evaluated, for pT3bpNX/ FIGO IIIB. This pathology was reviewed by 3 pathologists in [facility] system. Patient was stable for DC home on 07-03-14. Within 24 hours after discharge she developed nausea, vomiting and abdominal pain, requiring readmission from 7-25 thru 07-15-14 for SBO and mild pancreatitis. She was managed conservatively in hospital, followed by Dr Denman George, did require NG decompression and support with TNA via PICC. Repeat CT AP was negative for high grade bowel obstruction or transition point; PICC was removed prior to DC home and diabetes was managed with SS insulin. Lovenox was continued in hospital but was not resumed at DC (initially planned x 4 weeks post op). She had endoscopic [place] with biopsy of mass in tail of pancreas by Dr Paulita Fujita on 07-30-14, path confirming mucinous cystadenocarcinoma involvement there. She began dose dense carboplatin taxol on 08-08-14.   Review of systems as above, also: No fever or symptoms of infection. No problems with PAC. No bleeding. No SOB or cough. No neuropathy in hands. Remainder of 10 point Review of Systems negative.  Objective:  Vital signs in last 24 hours:  BP 105/65 mmHg  Pulse 97  Temp(Src) 98 F (36.7 C) (Oral)  Resp 18  Ht 5\' 2"  (1.575 m)  Wt 140 lb 6.4 oz (63.685 kg)  BMI 25.67 kg/m2 Weight is down 6 lbs Alert, oriented and appropriate. Ambulatory without difficulty.  Partial alopecia  HEENT:PERRL, sclerae not icteric. Oral mucosa moist without lesions, posterior pharynx clear.  Neck supple. No JVD.  Lymphatics:no cervical,supraclavicular, or inguinal adenopathy Resp: clear to auscultation bilaterally and normal percussion bilaterally Cardio: regular rate and rhythm. No gallop. GI: soft, nontender, not distended, no mass or organomegaly. Normally active bowel sounds. Surgical incision not remarkable. Musculoskeletal/ Extremities: without pitting edema, cords, tenderness Neuro: peripheral neuropathy as noted.  Otherwise nonfocal. PSYCH affect not as bright today Skin without rash, ecchymosis, petechiae' Portacath-without erythema or tenderness  Lab Results:  Results for orders placed or performed in visit on 10/24/14  Comprehensive metabolic panel (Cmet) - [facility]  Result Value Ref Range   Sodium 134 (L) 136 - 145 mEq/L   Potassium 5.1 3.5 - 5.1 mEq/L   Chloride 98 98 - 109 mEq/L   CO2 25 22 - 29 mEq/L   Glucose 214 (H) 70 - 140 mg/dl   BUN 15.1 7.0 - 26.0 mg/dL   Creatinine 0.7 0.6 - 1.1 mg/dL   Total Bilirubin 0.57 0.20 - 1.20 mg/dL   Alkaline Phosphatase 79 40 - 150 U/L   AST 10 5 - 34 U/L   ALT 10 0 - 55 U/L   Total Protein 6.4 6.4 - 8.3 g/dL   Albumin 3.3 (L) 3.5 - 5.0 g/dL   Calcium 9.7 8.4 - 10.4 mg/dL   Anion Gap 11 3 - 11 mEq/L  CBC with Differential  Result Value Ref Range   WBC 5.0 3.9 - 10.3 10e3/uL   NEUT# 3.9 1.5 - 6.5 10e3/uL   HGB 9.9 (L) 11.6 - 15.9 g/dL   HCT 30.0 (L) 34.8 - 46.6 %   Platelets 180 145 - 400 10e3/uL   MCV 89.6 79.5 - 101.0 fL   MCH 29.4 25.1 - 34.0 pg   MCHC 32.8 31.5 - 36.0 g/dL   RBC 3.35 (L) 3.70 - 5.45 10e6/uL   RDW  21.0 (H) 11.2 - 14.5 %   lymph# 1.1 0.9 - 3.3 10e3/uL   MONO# 0.0 (L) 0.1 - 0.9 10e3/uL   Eosinophils Absolute 0.0 0.0 - 0.5 10e3/uL   Basophils Absolute 0.0 0.0 - 0.1 10e3/uL   NEUT% 77.7 (H) 38.4 - 76.8 %   LYMPH% 21.2 14.0 - 49.7 %   MONO% 0.8 0.0 - 14.0 %   EOS% 0.0 0.0 - 7.0 %   BASO% 0.3 0.0 - 2.0 %   Note she is on steroids with blood sugar as above. CA125 by new lab method down to 37 on 10-10-14, having been 114 in Sept.  CA19-9 down to 21 on 10-10-14, having been 123 in Sept.  Studies/Results:  [facility] results are available in EMR  Medications: I have reviewed the patient's current medications. She would like to try stopping Lodine, this begun by Dr Denman George when initial pain was so intense. Metformin as above. Ativan and dilaudid refilled.  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 Dose dense carbo taxol begun 8-28, Neupogen at home. Day 15 cycle 4 today. [facility] information resulted, may be of help in future. 2. Diabetes: on oral agent prior to this diagnosis. Following blood sugars here and agrees to check at home particularly with variable po intake and decadron with taxol.  3.SBO postoperatively: resolved with conservative care. 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 PAC in   I will see her back in 2-3 weeks and she knows to call if needed prior to scheduled appointment. She will need repeat scans at least after 6 cycles.  Wyat Infinger P, MD   10/24/2014, 9:45 AM

## 2014-10-25 ENCOUNTER — Other Ambulatory Visit: Payer: Self-pay | Admitting: Oncology

## 2014-10-31 ENCOUNTER — Encounter: Payer: Self-pay | Admitting: Oncology

## 2014-10-31 ENCOUNTER — Other Ambulatory Visit: Payer: Self-pay | Admitting: Oncology

## 2014-10-31 ENCOUNTER — Other Ambulatory Visit: Payer: Managed Care, Other (non HMO)

## 2014-10-31 ENCOUNTER — Ambulatory Visit (HOSPITAL_BASED_OUTPATIENT_CLINIC_OR_DEPARTMENT_OTHER): Payer: Managed Care, Other (non HMO)

## 2014-10-31 ENCOUNTER — Other Ambulatory Visit (HOSPITAL_BASED_OUTPATIENT_CLINIC_OR_DEPARTMENT_OTHER): Payer: Managed Care, Other (non HMO)

## 2014-10-31 VITALS — BP 117/78 | HR 118 | Temp 98.7°F | Resp 16

## 2014-10-31 DIAGNOSIS — C801 Malignant (primary) neoplasm, unspecified: Secondary | ICD-10-CM

## 2014-10-31 DIAGNOSIS — C252 Malignant neoplasm of tail of pancreas: Secondary | ICD-10-CM

## 2014-10-31 DIAGNOSIS — E119 Type 2 diabetes mellitus without complications: Secondary | ICD-10-CM

## 2014-10-31 DIAGNOSIS — B370 Candidal stomatitis: Secondary | ICD-10-CM

## 2014-10-31 DIAGNOSIS — C7961 Secondary malignant neoplasm of right ovary: Secondary | ICD-10-CM

## 2014-10-31 DIAGNOSIS — C561 Malignant neoplasm of right ovary: Secondary | ICD-10-CM

## 2014-10-31 DIAGNOSIS — C7989 Secondary malignant neoplasm of other specified sites: Secondary | ICD-10-CM

## 2014-10-31 DIAGNOSIS — Z5111 Encounter for antineoplastic chemotherapy: Secondary | ICD-10-CM

## 2014-10-31 LAB — CBC WITH DIFFERENTIAL/PLATELET
BASO%: 0 % (ref 0.0–2.0)
Basophils Absolute: 0 10*3/uL (ref 0.0–0.1)
EOS%: 0 % (ref 0.0–7.0)
Eosinophils Absolute: 0 10*3/uL (ref 0.0–0.5)
HEMATOCRIT: 30 % — AB (ref 34.8–46.6)
HGB: 10 g/dL — ABNORMAL LOW (ref 11.6–15.9)
LYMPH%: 26 % (ref 14.0–49.7)
MCH: 29.9 pg (ref 25.1–34.0)
MCHC: 33.3 g/dL (ref 31.5–36.0)
MCV: 89.8 fL (ref 79.5–101.0)
MONO#: 0 10*3/uL — AB (ref 0.1–0.9)
MONO%: 0.6 % (ref 0.0–14.0)
NEUT#: 2.6 10*3/uL (ref 1.5–6.5)
NEUT%: 73.4 % (ref 38.4–76.8)
PLATELETS: 143 10*3/uL — AB (ref 145–400)
RBC: 3.34 10*6/uL — ABNORMAL LOW (ref 3.70–5.45)
RDW: 20.1 % — ABNORMAL HIGH (ref 11.2–14.5)
WBC: 3.5 10*3/uL — AB (ref 3.9–10.3)
lymph#: 0.9 10*3/uL (ref 0.9–3.3)

## 2014-10-31 LAB — COMPREHENSIVE METABOLIC PANEL (CC13)
ALBUMIN: 3.2 g/dL — AB (ref 3.5–5.0)
ALK PHOS: 78 U/L (ref 40–150)
ALT: 8 U/L (ref 0–55)
AST: 8 U/L (ref 5–34)
Anion Gap: 11 mEq/L (ref 3–11)
BUN: 11.8 mg/dL (ref 7.0–26.0)
CHLORIDE: 98 meq/L (ref 98–109)
CO2: 24 mEq/L (ref 22–29)
Calcium: 9.8 mg/dL (ref 8.4–10.4)
Creatinine: 0.7 mg/dL (ref 0.6–1.1)
Glucose: 212 mg/dl — ABNORMAL HIGH (ref 70–140)
Potassium: 4.9 mEq/L (ref 3.5–5.1)
Sodium: 134 mEq/L — ABNORMAL LOW (ref 136–145)
Total Bilirubin: 0.44 mg/dL (ref 0.20–1.20)
Total Protein: 6.4 g/dL (ref 6.4–8.3)

## 2014-10-31 MED ORDER — ONDANSETRON 16 MG/50ML IVPB (CHCC)
16.0000 mg | Freq: Once | INTRAVENOUS | Status: AC
  Administered 2014-10-31: 16 mg via INTRAVENOUS

## 2014-10-31 MED ORDER — SODIUM CHLORIDE 0.9 % IV SOLN
Freq: Once | INTRAVENOUS | Status: AC
  Administered 2014-10-31: 14:00:00 via INTRAVENOUS

## 2014-10-31 MED ORDER — SODIUM CHLORIDE 0.9 % IV SOLN
410.0000 mg | Freq: Once | INTRAVENOUS | Status: AC
  Administered 2014-10-31: 410 mg via INTRAVENOUS
  Filled 2014-10-31: qty 41

## 2014-10-31 MED ORDER — DIPHENHYDRAMINE HCL 50 MG/ML IJ SOLN
50.0000 mg | Freq: Once | INTRAMUSCULAR | Status: AC
  Administered 2014-10-31: 50 mg via INTRAVENOUS

## 2014-10-31 MED ORDER — FAMOTIDINE IN NACL 20-0.9 MG/50ML-% IV SOLN
20.0000 mg | Freq: Once | INTRAVENOUS | Status: AC
  Administered 2014-10-31: 20 mg via INTRAVENOUS

## 2014-10-31 MED ORDER — FAMOTIDINE IN NACL 20-0.9 MG/50ML-% IV SOLN
INTRAVENOUS | Status: AC
  Filled 2014-10-31: qty 50

## 2014-10-31 MED ORDER — ONDANSETRON 16 MG/50ML IVPB (CHCC)
INTRAVENOUS | Status: AC
  Filled 2014-10-31: qty 16

## 2014-10-31 MED ORDER — SODIUM CHLORIDE 0.9 % IJ SOLN
10.0000 mL | INTRAMUSCULAR | Status: DC | PRN
  Administered 2014-10-31: 10 mL
  Filled 2014-10-31: qty 10

## 2014-10-31 MED ORDER — PACLITAXEL CHEMO INJECTION 300 MG/50ML
80.0000 mg/m2 | Freq: Once | INTRAVENOUS | Status: AC
  Administered 2014-10-31: 138 mg via INTRAVENOUS
  Filled 2014-10-31: qty 23

## 2014-10-31 MED ORDER — HEPARIN SOD (PORK) LOCK FLUSH 100 UNIT/ML IV SOLN
500.0000 [IU] | Freq: Once | INTRAVENOUS | Status: AC | PRN
  Administered 2014-10-31: 500 [IU]
  Filled 2014-10-31: qty 5

## 2014-10-31 MED ORDER — INSULIN REGULAR HUMAN 100 UNIT/ML IJ SOLN
2.0000 [IU] | Freq: Once | INTRAMUSCULAR | Status: AC
  Administered 2014-10-31: 4 [IU] via SUBCUTANEOUS
  Filled 2014-10-31: qty 0.08

## 2014-10-31 MED ORDER — DEXAMETHASONE SODIUM PHOSPHATE 20 MG/5ML IJ SOLN
INTRAMUSCULAR | Status: AC
  Filled 2014-10-31: qty 5

## 2014-10-31 MED ORDER — DIPHENHYDRAMINE HCL 50 MG/ML IJ SOLN
INTRAMUSCULAR | Status: AC
  Filled 2014-10-31: qty 1

## 2014-10-31 MED ORDER — FLUCONAZOLE 100 MG PO TABS
100.0000 mg | ORAL_TABLET | Freq: Every day | ORAL | Status: DC
Start: 2014-10-31 — End: 2014-11-18

## 2014-10-31 MED ORDER — DEXAMETHASONE SODIUM PHOSPHATE 20 MG/5ML IJ SOLN
20.0000 mg | Freq: Once | INTRAMUSCULAR | Status: AC
  Administered 2014-10-31: 20 mg via INTRAVENOUS

## 2014-10-31 NOTE — Patient Instructions (Signed)
[facility] Discharge Instructions for Patients Receiving Chemotherapy  Today you received the following chemotherapy agents taxol, carboplatin  To help prevent nausea and vomiting after your treatment, we encourage you to take your nausea medication as directed by Dr Marko Plume   If you develop nausea and vomiting that is not controlled by your nausea medication, call the clinic.   BELOW ARE SYMPTOMS THAT SHOULD BE REPORTED IMMEDIATELY:  *FEVER GREATER THAN 100.5 F  *CHILLS WITH OR WITHOUT FEVER  NAUSEA AND VOMITING THAT IS NOT CONTROLLED WITH YOUR NAUSEA MEDICATION  *UNUSUAL SHORTNESS OF BREATH  *UNUSUAL BRUISING OR BLEEDING  TENDERNESS IN MOUTH AND THROAT WITH OR WITHOUT PRESENCE OF ULCERS  *URINARY PROBLEMS  *BOWEL PROBLEMS  UNUSUAL RASH Items with * indicate a potential emergency and should be followed up as soon as possible.  Feel free to call the clinic you have any questions or concerns. The clinic phone number is [phone].

## 2014-10-31 NOTE — Progress Notes (Signed)
Radiology bill was paid by Adonis Huguenin and Altha Harm per [employer]. I called and left a message for the patient/hubby.

## 2014-11-01 LAB — CA 125: CA 125: 33 U/mL (ref <35)

## 2014-11-01 LAB — CANCER ANTIGEN 19-9: CA 19-9: 19.8 U/mL (ref <35.0)

## 2014-11-07 ENCOUNTER — Other Ambulatory Visit (HOSPITAL_BASED_OUTPATIENT_CLINIC_OR_DEPARTMENT_OTHER): Payer: Managed Care, Other (non HMO)

## 2014-11-07 ENCOUNTER — Other Ambulatory Visit: Payer: Self-pay | Admitting: Hematology

## 2014-11-07 ENCOUNTER — Other Ambulatory Visit: Payer: Self-pay | Admitting: *Deleted

## 2014-11-07 ENCOUNTER — Ambulatory Visit: Payer: Managed Care, Other (non HMO)

## 2014-11-07 DIAGNOSIS — C7961 Secondary malignant neoplasm of right ovary: Secondary | ICD-10-CM

## 2014-11-07 DIAGNOSIS — C252 Malignant neoplasm of tail of pancreas: Secondary | ICD-10-CM

## 2014-11-07 DIAGNOSIS — C7989 Secondary malignant neoplasm of other specified sites: Secondary | ICD-10-CM

## 2014-11-07 DIAGNOSIS — C561 Malignant neoplasm of right ovary: Secondary | ICD-10-CM

## 2014-11-07 DIAGNOSIS — C801 Malignant (primary) neoplasm, unspecified: Secondary | ICD-10-CM

## 2014-11-07 LAB — CBC WITH DIFFERENTIAL/PLATELET
BASO%: 0.4 % (ref 0.0–2.0)
Basophils Absolute: 0 10*3/uL (ref 0.0–0.1)
EOS%: 0.1 % (ref 0.0–7.0)
Eosinophils Absolute: 0 10*3/uL (ref 0.0–0.5)
HCT: 28 % — ABNORMAL LOW (ref 34.8–46.6)
HGB: 9.2 g/dL — ABNORMAL LOW (ref 11.6–15.9)
LYMPH#: 0.6 10*3/uL — AB (ref 0.9–3.3)
LYMPH%: 34.4 % (ref 14.0–49.7)
MCH: 30.4 pg (ref 25.1–34.0)
MCHC: 32.9 g/dL (ref 31.5–36.0)
MCV: 92.6 fL (ref 79.5–101.0)
MONO#: 0 10*3/uL — ABNORMAL LOW (ref 0.1–0.9)
MONO%: 1.9 % (ref 0.0–14.0)
NEUT#: 1.2 10*3/uL — ABNORMAL LOW (ref 1.5–6.5)
NEUT%: 63.2 % (ref 38.4–76.8)
Platelets: 115 10*3/uL — ABNORMAL LOW (ref 145–400)
RBC: 3.03 10*6/uL — ABNORMAL LOW (ref 3.70–5.45)
RDW: 23 % — AB (ref 11.2–14.5)
WBC: 1.9 10*3/uL — ABNORMAL LOW (ref 3.9–10.3)

## 2014-11-07 LAB — COMPREHENSIVE METABOLIC PANEL (CC13)
ALT: 11 U/L (ref 0–55)
ANION GAP: 12 meq/L — AB (ref 3–11)
AST: 11 U/L (ref 5–34)
Albumin: 3.3 g/dL — ABNORMAL LOW (ref 3.5–5.0)
Alkaline Phosphatase: 78 U/L (ref 40–150)
BUN: 12.4 mg/dL (ref 7.0–26.0)
CALCIUM: 9.5 mg/dL (ref 8.4–10.4)
CO2: 24 meq/L (ref 22–29)
Chloride: 97 mEq/L — ABNORMAL LOW (ref 98–109)
Creatinine: 0.7 mg/dL (ref 0.6–1.1)
GLUCOSE: 226 mg/dL — AB (ref 70–140)
Potassium: 4.9 mEq/L (ref 3.5–5.1)
SODIUM: 133 meq/L — AB (ref 136–145)
TOTAL PROTEIN: 6.2 g/dL — AB (ref 6.4–8.3)
Total Bilirubin: 0.56 mg/dL (ref 0.20–1.20)

## 2014-11-07 MED ORDER — OXYMORPHONE HCL ER 30 MG PO TB12: 30.0000 mg | ORAL_TABLET | Freq: Two times a day (BID) | ORAL | Status: DC

## 2014-11-07 MED ORDER — LORAZEPAM 0.5 MG PO TABS: ORAL_TABLET | ORAL | Status: DC

## 2014-11-07 NOTE — Progress Notes (Signed)
CBC reviewed with Dr. Burr Medico: Order received to hold Taxol today. Pt to self inject Neupogen 11/27 and 11/28. Follow up next week as scheduled. Pt and husband voiced understanding.

## 2014-11-07 NOTE — Telephone Encounter (Signed)
Scripts given to patient in Infusion Room at her treatment

## 2014-11-08 ENCOUNTER — Other Ambulatory Visit: Payer: Self-pay | Admitting: Oncology

## 2014-11-10 ENCOUNTER — Telehealth: Payer: Self-pay | Admitting: Oncology

## 2014-11-10 NOTE — Telephone Encounter (Signed)
, °

## 2014-11-13 ENCOUNTER — Telehealth: Payer: Self-pay

## 2014-11-13 ENCOUNTER — Other Ambulatory Visit: Payer: Managed Care, Other (non HMO)

## 2014-11-13 ENCOUNTER — Ambulatory Visit: Payer: Managed Care, Other (non HMO) | Admitting: Oncology

## 2014-11-13 DIAGNOSIS — R300 Dysuria: Secondary | ICD-10-CM

## 2014-11-13 NOTE — Telephone Encounter (Signed)
Ordered labs for urine tests for tomorrow. Dr. Marko Plume notified.

## 2014-11-13 NOTE — Telephone Encounter (Signed)
Kristin Meadows  called stating that she has burning with urination. Urine cloudy and with a foul odor.  She would like an order for a UA and C&S for her lab appointment tomorrow prior to her visit with Dr. Marko Plume.

## 2014-11-14 ENCOUNTER — Encounter: Payer: Self-pay | Admitting: Oncology

## 2014-11-14 ENCOUNTER — Ambulatory Visit: Payer: Managed Care, Other (non HMO)

## 2014-11-14 ENCOUNTER — Other Ambulatory Visit (HOSPITAL_BASED_OUTPATIENT_CLINIC_OR_DEPARTMENT_OTHER): Payer: Managed Care, Other (non HMO)

## 2014-11-14 ENCOUNTER — Other Ambulatory Visit: Payer: Managed Care, Other (non HMO)

## 2014-11-14 ENCOUNTER — Ambulatory Visit (HOSPITAL_BASED_OUTPATIENT_CLINIC_OR_DEPARTMENT_OTHER): Payer: Managed Care, Other (non HMO) | Admitting: Oncology

## 2014-11-14 VITALS — BP 125/69 | HR 100 | Temp 97.4°F | Resp 18 | Ht 62.0 in | Wt 141.9 lb

## 2014-11-14 DIAGNOSIS — R309 Painful micturition, unspecified: Secondary | ICD-10-CM

## 2014-11-14 DIAGNOSIS — C252 Malignant neoplasm of tail of pancreas: Secondary | ICD-10-CM

## 2014-11-14 DIAGNOSIS — B3731 Acute candidiasis of vulva and vagina: Secondary | ICD-10-CM

## 2014-11-14 DIAGNOSIS — C7889 Secondary malignant neoplasm of other digestive organs: Secondary | ICD-10-CM

## 2014-11-14 DIAGNOSIS — C801 Malignant (primary) neoplasm, unspecified: Secondary | ICD-10-CM

## 2014-11-14 DIAGNOSIS — C561 Malignant neoplasm of right ovary: Secondary | ICD-10-CM

## 2014-11-14 DIAGNOSIS — G629 Polyneuropathy, unspecified: Secondary | ICD-10-CM

## 2014-11-14 DIAGNOSIS — B373 Candidiasis of vulva and vagina: Secondary | ICD-10-CM

## 2014-11-14 DIAGNOSIS — R300 Dysuria: Secondary | ICD-10-CM

## 2014-11-14 DIAGNOSIS — C7961 Secondary malignant neoplasm of right ovary: Secondary | ICD-10-CM

## 2014-11-14 LAB — COMPREHENSIVE METABOLIC PANEL (CC13)
ALBUMIN: 3.1 g/dL — AB (ref 3.5–5.0)
ALT: 10 U/L (ref 0–55)
ANION GAP: 13 meq/L — AB (ref 3–11)
AST: 9 U/L (ref 5–34)
Alkaline Phosphatase: 81 U/L (ref 40–150)
BILIRUBIN TOTAL: 0.52 mg/dL (ref 0.20–1.20)
BUN: 11.1 mg/dL (ref 7.0–26.0)
CO2: 22 mEq/L (ref 22–29)
Calcium: 9.4 mg/dL (ref 8.4–10.4)
Chloride: 99 mEq/L (ref 98–109)
Creatinine: 0.7 mg/dL (ref 0.6–1.1)
GLUCOSE: 226 mg/dL — AB (ref 70–140)
POTASSIUM: 4.6 meq/L (ref 3.5–5.1)
Sodium: 133 mEq/L — ABNORMAL LOW (ref 136–145)
Total Protein: 6.1 g/dL — ABNORMAL LOW (ref 6.4–8.3)

## 2014-11-14 LAB — CBC WITH DIFFERENTIAL/PLATELET
BASO%: 0 % (ref 0.0–2.0)
Basophils Absolute: 0 10*3/uL (ref 0.0–0.1)
EOS%: 0 % (ref 0.0–7.0)
Eosinophils Absolute: 0 10*3/uL (ref 0.0–0.5)
HCT: 31.1 % — ABNORMAL LOW (ref 34.8–46.6)
HGB: 10 g/dL — ABNORMAL LOW (ref 11.6–15.9)
LYMPH#: 0.8 10*3/uL — AB (ref 0.9–3.3)
LYMPH%: 39.4 % (ref 14.0–49.7)
MCH: 30.8 pg (ref 25.1–34.0)
MCHC: 32.2 g/dL (ref 31.5–36.0)
MCV: 95.7 fL (ref 79.5–101.0)
MONO#: 0 10*3/uL — ABNORMAL LOW (ref 0.1–0.9)
MONO%: 2 % (ref 0.0–14.0)
NEUT#: 1.2 10*3/uL — ABNORMAL LOW (ref 1.5–6.5)
NEUT%: 58.6 % (ref 38.4–76.8)
NRBC: 0 % (ref 0–0)
Platelets: 91 10*3/uL — ABNORMAL LOW (ref 145–400)
RBC: 3.25 10*6/uL — ABNORMAL LOW (ref 3.70–5.45)
RDW: 20.7 % — AB (ref 11.2–14.5)
WBC: 2 10*3/uL — ABNORMAL LOW (ref 3.9–10.3)

## 2014-11-14 LAB — URINALYSIS, MICROSCOPIC - CHCC
BILIRUBIN (URINE): NEGATIVE
GLUCOSE UR CHCC: 1000 mg/dL
Ketones: NEGATIVE mg/dL
LEUKOCYTE ESTERASE: NEGATIVE
NITRITE: NEGATIVE
Protein: <30 mg/dL
Specific Gravity, Urine: 1.015 (ref 1.003–1.035)
UROBILINOGEN UR: 0.2 mg/dL (ref 0.2–1)
pH: 6 (ref 4.6–8.0)

## 2014-11-14 MED ORDER — CIPROFLOXACIN HCL 250 MG PO TABS: 250.0000 mg | ORAL_TABLET | Freq: Two times a day (BID) | ORAL | Status: DC

## 2014-11-14 MED ORDER — HYDROMORPHONE HCL 2 MG PO TABS: 2.0000 mg | ORAL_TABLET | ORAL | Status: DC | PRN

## 2014-11-14 NOTE — Progress Notes (Signed)
OFFICE PROGRESS NOTE   11/14/2014   Physicians:Rossi, Dewayne Shorter, MD (PCP [facility], [place]); Ellouise Newer (gyn [place]), cardiology [place], neurologist [place]; Jackquline Denmark (GI [place]); Joie Bimler (urologist ), Arta Silence  INTERVAL HISTORY:  Patient is seen, together with husband, in continuing attention to mucinous cystadenocarcinoma of ovarian (vs pancreatic) primary. She is due day 15 cycle 5 dose dense carbo taxol today, however ANC again today is too low to treat. Day 8 of this cycle was held last week also due to low ANC, and she did use neupogen x either 1 or 2 doses last week. She has had no fever, does have symptoms of possible bladder infection past 2-3 days.  Patient reports that both feet are now "completely numb", no better with week break from taxol, fingers less neuropathy than feet and some better with this short chemo break. She had one episode of nausea in past ~ 2 weeks, with acid reflux then; she increased omeprazole briefly with improvement. She is generally comfortable using dilaudid total 4 mg bid, pain mostly mid abdomen. She has been eating and drinking fluids, bowels are moving. She denies increased SOB or other symptoms of infection.     She has PAC. Anaphylaxis with flu vaccine in past.  Husband very supportive.  ONCOLOGIC HISTORY Patient has history of cystitis 2 years ago which presented with suprapubic and back pain, resolved with macrobid x 3 months; CT then was not remarkable. She developed similar symptoms over several months recently and was seen by Dr Nila Nephew with finding of urinary retention. CT AP without contrast at [facility] 06-09-14 showed 9.5 x 14.8 x 10.5 cm complex cystic mass in central pelvis with uterus surgically absent, no adenopathy abdomen or pelvic sidewall, apparent thickening tail of pancreas and question of peripancreatic stranding, 2.5 x 1.8 cm low density structure adjacent to  right atrium. She was seen by Dr Ellouise Newer on 06-11-14, with pelvic mass on exam, CA 125 152 and CEA 3.28 both on 06-11-14. She was seen by Dr Denman George on 06-16-14, at which time she reported 10 lb weight loss in 2 weeks, early satiety, increased constipation and pain; her exam revealed large smooth pelvic mass filling pelvis, adherent to vagina and in close approximation to rectum, minimally mobile. CA 19-9 from 06-16-14 was 224.7. She had MRI abdomen at [facility] on 06-26-14 with findings of abnormal rounded/ truncated appearance of distal pancreatic body/tail with peripancreatic fluid/ inflammatory changes new compared with scans of 2013, associated splenic vein thrombosis, small volume ascites, no suspicious adenopathy, and probable benign pericardial cyst. She had exploratory laparotomy with right salpingo-oophorectomy, omentectomy and radical debulking of ovarian cancer by Dr Denman George at [facility] on 07-01-14. Intraoperative findings were of 20 cm cystic mass arising from right ovary, filling pelvis centrally and adherent to sigmoid colon, 200 cc ascites, peritoneal tumor plaques anterior pelvis and right diaphragm with TNTC 1 cm nodules small bowel mesentery and bowel wall; enlarged pancreatic body was described as rigid and abnormally firm to palpation. At completion of surgery there was residual tumor at the mesentery of the small and large intestine, thin tumor plaques on the diaphragm and pelvic peritoneum, and gallbladder serosa and pouch of Randol Kern representing an incomplete and suboptimal cytoreduction.  Pathology [phone] from 07-01-14 showed 16 x 11 x 7 cm mucinous cystadenocarcinoma of right ovary with capsule intact, tumor on surface of ovary and identical tumor involving omentum and peritoneal biopsies, no nodes evaluated, for pT3bpNX/ FIGO IIIB. This pathology  was reviewed by 3 pathologists in [facility] system. Patient was stable for DC home on 07-03-14. Within 24 hours after discharge she  developed nausea, vomiting and abdominal pain, requiring readmission from 7-25 thru 07-15-14 for SBO and mild pancreatitis. She was managed conservatively in hospital, followed by Dr Denman George, did require NG decompression and support with TNA via PICC. Repeat CT AP was negative for high grade bowel obstruction or transition point; PICC was removed prior to DC home and diabetes was managed with SS insulin. Lovenox was continued in hospital but was not resumed at DC (initially planned x 4 weeks post op). She had endoscopic [place] with biopsy of mass in tail of pancreas by Dr Paulita Fujita on 07-30-14, path confirming mucinous cystadenocarcinoma involvement there. She began dose dense carboplatin taxol on 08-08-14, with improvement in symptoms and in CA 19-9 and CA 125 . By 10-31-14 CA 19-9 was down to 19.8 and CA 125 down to 33.  Review of systems as above, also: PAC ok.. No other neurologic symptoms. No perineal itching. No LE swelling. She is still not checking blood sugars at home, despite my requests, is still taking metformin. Remainder of 10 point Review of Systems negative.  Objective:  Vital signs in last 24 hours:  BP 125/69 mmHg  Pulse 100  Temp(Src) 97.4 F (36.3 C) (Oral)  Resp 18  Ht 5\' 2"  (1.575 m)  Wt 141 lb 14.4 oz (64.365 kg)  BMI 25.95 kg/m2  SpO2 100% weight is up 1 lb.  Alert, oriented and appropriate. Ambulatory without assistance.  Alopecia  HEENT:PERRL, sclerae not icteric. Oral mucosa a little dry without lesions, posterior pharynx clear.  Neck supple. No JVD.  Lymphatics:no cervical,supraclavicular, or inguinal adenopathy Resp: clear to auscultation bilaterally and normal percussion bilaterally Cardio: tachy, regular rate and rhythm. No gallop. GI: soft, nontender, not distended, no mass or organomegaly. Some bowel sounds.  Musculoskeletal/ Extremities: without pitting edema, cords, tenderness Neuro: peripheral neuropathy feet bilaterally as noted. Otherwise nonfocal. PSYCH: affect  somewhat depressed/ anxious today. Skin without rash, ecchymosis, petechiae Breasts: without dominant mass, skin or nipple findings. Axillae benign. Portacath-without erythema or tenderness  Lab Results: CBC today with WBC 2.0, ANC 1.2, Hgb 10, plt 91 CMET with NA 133, glu 226 on steroids, creat o.7, T bili 0.52, Tprot 6.1, alb 3.1  UA available at time of visit does not obviously have infection, tho C&S sent and with symptoms will give short coverage with cipro awaiting culture results  Studies/Results:  No results found. Patient in agreement with restaging CTs now  Medications: I have reviewed the patient's current medications. Hold chemo both because of leukopenia and thrombocytopenia and because of progressive, significant neuropathy in bilateral feet. Cipro as above thru weekend, then depending on urine culture. Dilaudid refilled, as she has just 1 tablet left now.  DISCUSSION: Blood counts and peripheral neuropathy in feet as above. Will reimage with CTs prior to visit back to me in next couple of weeks, chemo break until that information. She has probably had maximum tolerated taxol now. UTI considerations as above.  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 Dose dense carbo taxol begun 8-28, Neupogen at home. Counts too low for day 8 or today's day 15 cycle 5 taxol, and significant worsening of peripheral neuropathy in feet bilaterally. Hold chemo, repeat CTs, follow up here after scans. Call if bleeding or fever. 2. Diabetes: on oral agent prior to this diagnosis. She will not  check blood sugars at home despite requests. 3.SBO postoperatively: resolved with conservative care. 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 Significant progression in feet now with taxol. Not appropriate to continue taxol. 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 Cipro as above due to leukopenia and weekend; f/u culture when available. Had vaginal yeast previously 11.Does not have advance directives  12. PAC in   Patient and husband understand issues as discussed above and are in agreement with recommendations and plan. Chemo cancelled for today, infusion notified by MD. Patient aware that we will contact her with next appointments. She knows to call prior to next scheduled appointment if needed. Time spent 25 min including >50% counseling and coordination of care.     LIVESAY,LENNIS P, MD   11/14/2014, 12:20 PM

## 2014-11-16 LAB — URINE CULTURE

## 2014-11-17 ENCOUNTER — Telehealth: Payer: Self-pay

## 2014-11-17 ENCOUNTER — Telehealth: Payer: Self-pay | Admitting: Oncology

## 2014-11-17 NOTE — Telephone Encounter (Signed)
Told Kristin Meadows that the urine culture indicated  no urinary tract infection and no yeast growing. Kristin Meadows states that she continues with a rawness between her legs.  She has been applying Vaseline to the area.  Told her that Dr. Marko Plume suggested trying Desitin.  She has been incontinent at times during the night.   Kristin Meadows wanted to see if Dr. Marko Plume would order Diflucan X 7 days as she is prone to yeast infections, especially with ATB.  She just took cipro x3 days for urinary symptoms.  She would like to see if Diflucan would help her urinary symptoms before she contact her urologist in [place] Dr. Joie Bimler.

## 2014-11-18 ENCOUNTER — Telehealth: Payer: Self-pay | Admitting: *Deleted

## 2014-11-18 ENCOUNTER — Other Ambulatory Visit: Payer: Self-pay | Admitting: *Deleted

## 2014-11-18 MED ORDER — TAMSULOSIN HCL 0.4 MG PO CAPS: 0.4000 mg | ORAL_CAPSULE | Freq: Every evening | ORAL | Status: DC

## 2014-11-18 MED ORDER — FLUCONAZOLE 100 MG PO TABS: ORAL_TABLET | ORAL | Status: DC

## 2014-11-18 NOTE — Telephone Encounter (Signed)
Pt called yesterday wanting to see if Dr. Marko Plume would order Diflucan X 7 days as she is prone to yeast infections, especially with ATB. She just took cipro x3 days for urinary symptoms. Per Dr. Marko Plume - pt can take 100mg  diflucan daily for 7 days. Script sent to pharmacy and pt notified of this. Pt states that she did not have any incontinence last night and that using Vaseline is helping with the irritation between her legs. Told pt to please call us back if no relief from the Diflucan - pt agreeable to this.

## 2014-11-21 ENCOUNTER — Telehealth: Payer: Self-pay | Admitting: Oncology

## 2014-11-21 NOTE — Telephone Encounter (Signed)
Medical Oncology  Insurance denied CT chest. MD called [phone], held x 14 min to do peer to peer review. Discussed case with MD who authorized CT chest in addition to CT AP already approved. Same authorization # [id number] and insurance will send another written approval including the CT chest. This information relayed to [facility] staff, Vaughan Basta to contact radiology now.  Godfrey Pick, MD

## 2014-11-24 ENCOUNTER — Encounter (HOSPITAL_COMMUNITY): Payer: Self-pay

## 2014-11-24 ENCOUNTER — Ambulatory Visit (HOSPITAL_COMMUNITY)
Admission: RE | Admit: 2014-11-24 | Discharge: 2014-11-24 | Disposition: A | Discharge status: Home / Self Care | Payer: Managed Care, Other (non HMO) | Source: Ambulatory Visit | Attending: Oncology | Admitting: Oncology

## 2014-11-24 DIAGNOSIS — C561 Malignant neoplasm of right ovary: Secondary | ICD-10-CM | POA: Diagnosis not present

## 2014-11-24 DIAGNOSIS — R188 Other ascites: Secondary | ICD-10-CM | POA: Diagnosis not present

## 2014-11-24 DIAGNOSIS — I82890 Acute embolism and thrombosis of other specified veins: Secondary | ICD-10-CM | POA: Insufficient documentation

## 2014-11-24 DIAGNOSIS — C252 Malignant neoplasm of tail of pancreas: Secondary | ICD-10-CM | POA: Insufficient documentation

## 2014-11-24 DIAGNOSIS — Z90710 Acquired absence of both cervix and uterus: Secondary | ICD-10-CM | POA: Insufficient documentation

## 2014-11-24 MED ORDER — IOHEXOL 300 MG/ML  SOLN
100.0000 mL | Freq: Once | INTRAMUSCULAR | Status: AC | PRN
  Administered 2014-11-24: 100 mL via INTRAVENOUS

## 2014-11-28 ENCOUNTER — Other Ambulatory Visit: Payer: Self-pay | Admitting: *Deleted

## 2014-11-28 DIAGNOSIS — C561 Malignant neoplasm of right ovary: Secondary | ICD-10-CM

## 2014-11-28 MED ORDER — PROMETHAZINE HCL 25 MG PO TABS: 25.0000 mg | ORAL_TABLET | Freq: Three times a day (TID) | ORAL | Status: DC | PRN

## 2014-11-30 ENCOUNTER — Other Ambulatory Visit: Payer: Self-pay | Admitting: Oncology

## 2014-12-01 ENCOUNTER — Telehealth: Payer: Self-pay | Admitting: Oncology

## 2014-12-01 ENCOUNTER — Ambulatory Visit (HOSPITAL_BASED_OUTPATIENT_CLINIC_OR_DEPARTMENT_OTHER): Payer: Managed Care, Other (non HMO) | Admitting: Oncology

## 2014-12-01 ENCOUNTER — Ambulatory Visit (HOSPITAL_BASED_OUTPATIENT_CLINIC_OR_DEPARTMENT_OTHER): Payer: Managed Care, Other (non HMO)

## 2014-12-01 VITALS — BP 129/61 | HR 91 | Temp 97.6°F | Resp 18 | Ht 62.0 in | Wt 138.2 lb

## 2014-12-01 DIAGNOSIS — C561 Malignant neoplasm of right ovary: Secondary | ICD-10-CM

## 2014-12-01 DIAGNOSIS — B3731 Acute candidiasis of vulva and vagina: Secondary | ICD-10-CM

## 2014-12-01 DIAGNOSIS — G620 Drug-induced polyneuropathy: Secondary | ICD-10-CM

## 2014-12-01 DIAGNOSIS — T451X5A Adverse effect of antineoplastic and immunosuppressive drugs, initial encounter: Secondary | ICD-10-CM

## 2014-12-01 DIAGNOSIS — C252 Malignant neoplasm of tail of pancreas: Secondary | ICD-10-CM

## 2014-12-01 DIAGNOSIS — N3020 Other chronic cystitis without hematuria: Secondary | ICD-10-CM

## 2014-12-01 DIAGNOSIS — C801 Malignant (primary) neoplasm, unspecified: Secondary | ICD-10-CM

## 2014-12-01 DIAGNOSIS — B373 Candidiasis of vulva and vagina: Secondary | ICD-10-CM

## 2014-12-01 DIAGNOSIS — E119 Type 2 diabetes mellitus without complications: Secondary | ICD-10-CM

## 2014-12-01 LAB — CBC WITH DIFFERENTIAL/PLATELET
BASO%: 0.5 % (ref 0.0–2.0)
Basophils Absolute: 0 10*3/uL (ref 0.0–0.1)
EOS%: 1.3 % (ref 0.0–7.0)
Eosinophils Absolute: 0.1 10*3/uL (ref 0.0–0.5)
HEMATOCRIT: 30.8 % — AB (ref 34.8–46.6)
HGB: 10 g/dL — ABNORMAL LOW (ref 11.6–15.9)
LYMPH#: 2 10*3/uL (ref 0.9–3.3)
LYMPH%: 46.4 % (ref 14.0–49.7)
MCH: 31.4 pg (ref 25.1–34.0)
MCHC: 32.3 g/dL (ref 31.5–36.0)
MCV: 97.4 fL (ref 79.5–101.0)
MONO#: 0.3 10*3/uL (ref 0.1–0.9)
MONO%: 6.2 % (ref 0.0–14.0)
NEUT#: 2 10*3/uL (ref 1.5–6.5)
NEUT%: 45.6 % (ref 38.4–76.8)
Platelets: 167 10*3/uL (ref 145–400)
RBC: 3.17 10*6/uL — ABNORMAL LOW (ref 3.70–5.45)
RDW: 17.3 % — AB (ref 11.2–14.5)
WBC: 4.3 10*3/uL (ref 3.9–10.3)

## 2014-12-01 LAB — COMPREHENSIVE METABOLIC PANEL (CC13)
ALT: 10 U/L (ref 0–55)
AST: 11 U/L (ref 5–34)
Albumin: 3.2 g/dL — ABNORMAL LOW (ref 3.5–5.0)
Alkaline Phosphatase: 74 U/L (ref 40–150)
Anion Gap: 9 mEq/L (ref 3–11)
BUN: 9.1 mg/dL (ref 7.0–26.0)
CALCIUM: 9.8 mg/dL (ref 8.4–10.4)
CHLORIDE: 100 meq/L (ref 98–109)
CO2: 29 mEq/L (ref 22–29)
Creatinine: 0.6 mg/dL (ref 0.6–1.1)
EGFR: >90 mL/min/{1.73_m2} (ref >90)
Glucose: 99 mg/dl (ref 70–140)
Potassium: 4.3 mEq/L (ref 3.5–5.1)
Sodium: 138 mEq/L (ref 136–145)
Total Bilirubin: 0.57 mg/dL (ref 0.20–1.20)
Total Protein: 6.4 g/dL (ref 6.4–8.3)

## 2014-12-01 MED ORDER — LORAZEPAM 0.5 MG PO TABS: ORAL_TABLET | ORAL | Status: DC

## 2014-12-01 MED ORDER — OXYMORPHONE HCL ER 30 MG PO TB12: 30.0000 mg | ORAL_TABLET | Freq: Two times a day (BID) | ORAL | Status: DC

## 2014-12-01 MED ORDER — HYDROMORPHONE HCL 2 MG PO TABS: 2.0000 mg | ORAL_TABLET | ORAL | Status: DC | PRN

## 2014-12-01 MED ORDER — AMITRIPTYLINE HCL 25 MG PO TABS: 25.0000 mg | ORAL_TABLET | Freq: Every day | ORAL | Status: DC

## 2014-12-01 NOTE — Telephone Encounter (Signed)
per pof to sch pt appt-LL made appt @ uriology in [place]. sent MW emailt o sch pt trmt-will call pt once reply

## 2014-12-01 NOTE — Patient Instructions (Signed)
Dr Joie Bimler  Jan 5 @ 1115  Use promethazine only if you actually have nausea, not on regular schedule now  Try elavil (amitriptyline) 25 mg at bedtime for neuropathy  Increase laxatives for next few days to try to get bowels moving very well

## 2014-12-02 ENCOUNTER — Telehealth: Payer: Self-pay | Admitting: *Deleted

## 2014-12-02 ENCOUNTER — Telehealth: Payer: Self-pay | Admitting: Oncology

## 2014-12-02 LAB — CANCER ANTIGEN 19-9: CA 19-9: 66.3 U/mL — ABNORMAL HIGH (ref <35.0)

## 2014-12-02 LAB — CA 125: CA 125: 79 U/mL — AB (ref <35)

## 2014-12-02 NOTE — Telephone Encounter (Signed)
per pof to sch pt appt-per reply sch trmt-cld & spoke to pty in re to time & date-pt understood

## 2014-12-02 NOTE — Telephone Encounter (Signed)
Per staff message and POF I have scheduled appts. Advised scheduler of appts. JMW  

## 2014-12-06 ENCOUNTER — Encounter: Payer: Self-pay | Admitting: Oncology

## 2014-12-06 DIAGNOSIS — N3020 Other chronic cystitis without hematuria: Secondary | ICD-10-CM | POA: Insufficient documentation

## 2014-12-06 NOTE — Progress Notes (Signed)
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 She has had 4 cycles + day 1 cycle 5 dose dense carbo taxol from 08-08-14 thru 10-31-14.  CT CAP 11-24-14 showed decrease in lesion adjacent to right atrium, stranding at pancreatic tail region, question of bladder wall thickening, no ureteral obstruction,  no AP adenopathy, small scattered ascites, some constipation  Patient complains of low abdominal/ pelvic discomfort and occasional nausea in past week.  The pelvic discomfort seems similar to chronic cystitis, this treated with macrobid by urology for 3 months in ~ summer 2013.  She denies hematuria or fever. Peripheral neuropathy in feet is not improved with break off of taxol thus far, less bothersome in hands. Bowels are moving and patient recalls "good" BM just prior to CT (note significant stool seen on CT). Taste changes ongoing "junk foods taste best". I do not believe that she has checked blood sugars at home.   She transferred to another urology group in [place] after treatment for cystitis in 2013, now established with Dr Joie Bimler. Per my phone conversation with that office now, she was last seen in June 2015 by Dr Nila Nephew. Appointment made for Jan 5 @ 1115, records to be faxed and disc of CT AP to be available for husband to pick up afternoon 12-02-14. Last urine culture 12-4 had insignificant growth.  PAC in Anaphylaxis with flu vaccine in past  ONCOLOGIC  HISTORY Patient has history of cystitis 2 years ago which presented with suprapubic and back pain, resolved with macrobid x 3 months; CT then was not remarkable. She developed similar symptoms over several months recently and was seen by Dr Nila Nephew with finding of urinary retention. CT AP without contrast at [facility] 06-09-14 showed 9.5 x 14.8 x 10.5 cm complex cystic mass in central pelvis with uterus surgically absent, no adenopathy abdomen or pelvic sidewall, apparent thickening tail of pancreas and question of peripancreatic stranding, 2.5 x 1.8 cm low density structure adjacent to right atrium. She was seen by Dr Ellouise Newer on 06-11-14, with pelvic mass on exam, CA 125 152 and CEA 3.28 both on 06-11-14. She was seen by Dr Denman George on 06-16-14, at which time she reported 10 lb weight loss in 2 weeks, early satiety, increased constipation and pain; her exam revealed large smooth pelvic mass filling pelvis, adherent to vagina and in close approximation to rectum, minimally mobile. CA 19-9 from 06-16-14 was 224.7. She had MRI abdomen at [facility] on 06-26-14 with findings of abnormal rounded/ truncated appearance of distal pancreatic body/tail with peripancreatic fluid/ inflammatory changes new compared with scans of 2013, associated splenic vein thrombosis, small volume ascites, no suspicious adenopathy, and probable benign pericardial cyst. She had exploratory laparotomy with right salpingo-oophorectomy, omentectomy and radical debulking of ovarian cancer by Dr Denman George at [facility] on 07-01-14. Intraoperative findings were of 20 cm cystic mass arising from right ovary, filling pelvis centrally and adherent to sigmoid colon, 200 cc ascites, peritoneal tumor plaques anterior pelvis and right diaphragm with TNTC 1 cm nodules small bowel mesentery and bowel wall; enlarged pancreatic  body was described as rigid and abnormally firm to palpation. At completion of surgery there was residual tumor at the mesentery of the  small and large intestine, thin tumor plaques on the diaphragm and pelvic peritoneum, and gallbladder serosa and pouch of Randol Kern representing an incomplete and suboptimal cytoreduction.  Pathology [phone] from 07-01-14 showed 16 x 11 x 7 cm mucinous cystadenocarcinoma of right ovary with capsule intact, tumor on surface of ovary and identical tumor involving omentum and peritoneal biopsies, no nodes evaluated, for [id number]/ FIGO IIIB. This pathology was reviewed by 3 pathologists in [facility] system. Patient was stable for DC home on 07-03-14. Within 24 hours after discharge she developed nausea, vomiting and abdominal pain, requiring readmission from 7-25 thru 07-15-14 for SBO and mild pancreatitis. She was managed conservatively in hospital, followed by Dr Denman George, did require NG decompression and support with TNA via PICC. Repeat CT AP was negative for high grade bowel obstruction or transition point; PICC was removed prior to DC home and diabetes was managed with SS insulin. Lovenox was continued in hospital but was not resumed at DC (initially planned x 4 weeks post op). She had endoscopic [place] with biopsy of mass in tail of pancreas by Dr Paulita Fujita on 07-30-14, path confirming mucinous cystadenocarcinoma involvement there. She began dose dense carboplatin taxol on 08-08-14, with improvement in symptoms and in CA 19-9 and CA 125 . By 10-31-14 CA 19-9 was down to 19.8 and CA 125 down to 33. CT CAP showed decrease in lesion adjacent to right atrium, stranding at pancreatic tail region, question of bladder wall thickening, no ureteral obstruction,  no AP adenopathy, small scattered ascites, some constipation.    Review of systems as above, also: Energy not great, may be over sedation from regular phenergan. No other symptoms of infection. No problems with PAC. No bleeding. Remainder of 10 point Review of Systems negative.  Objective:  Vital signs in last 24 hours:  BP 129/61 mmHg  Pulse 91  Temp(Src) 97.6  F (36.4 C) (Oral)  Resp 18  Ht _0  (1.575 m)  Wt 138 lb 3.2 oz (62.687 kg)  BMI 25.27 kg/m2 Weight down 3 lbs Alert, oriented and appropriate. Ambulatory without assistance. Anxious. Partial alopecia HEENT:PERRL, sclerae not icteric. Oral mucosa moist without lesions, posterior pharynx clear.  Neck supple. No JVD.  Lymphatics:no cervical,supraclavicular, or inguinal adenopathy Resp: clear to auscultation bilaterally and normal percussion bilaterally Cardio: regular rate and rhythm. No gallop. GI: soft, nontender, not distended, no mass or organomegaly. Normally active bowel sounds. Surgical incision not remarkable. Musculoskeletal/ Extremities: without pitting edema, cords, tenderness Neuro: peripheral neuropathy distal feet bilaterally and tips of fingers. Otherwise nonfocal. PSYCH: seems less anxious at completion of visit Skin without rash, ecchymosis, petechiae Portacath-without erythema or tenderness  Lab Results:  Results for orders placed or performed in visit on 12/01/14  CBC with Differential  Result Value Ref Range   WBC 4.3 3.9 - 10.3 10e3/uL   NEUT# 2.0 1.5 - 6.5 10e3/uL   HGB 10.0 (L) 11.6 - 15.9 g/dL   HCT 30.8 (L) 34.8 - 46.6 %   Platelets 167 145 - 400 10e3/uL   MCV 97.4 79.5 - 101.0 fL   MCH 31.4 25.1 - 34.0 pg   MCHC 32.3 31.5 - 36.0 g/dL   RBC 3.17 (L) 3.70 - 5.45 10e6/uL   RDW 17.3 (H) 11.2 - 14.5 %   lymph# 2.0 0.9 - 3.3 10e3/uL   MONO# 0.3 0.1 - 0.9 10e3/uL  Eosinophils Absolute 0.1 0.0 - 0.5 10e3/uL   Basophils Absolute 0.0 0.0 - 0.1 10e3/uL   NEUT% 45.6 38.4 - 76.8 %   LYMPH% 46.4 14.0 - 49.7 %   MONO% 6.2 0.0 - 14.0 %   EOS% 1.3 0.0 - 7.0 %   BASO% 0.5 0.0 - 2.0 %  Comprehensive metabolic panel (Cmet) - [facility]  Result Value Ref Range   Sodium 138 136 - 145 mEq/L   Potassium 4.3 3.5 - 5.1 mEq/L   Chloride 100 98 - 109 mEq/L   CO2 29 22 - 29 mEq/L   Glucose 99 70 - 140 mg/dl   BUN 9.1 7.0 - 26.0 mg/dL   Creatinine 0.6 0.6 - 1.1 mg/dL    Total Bilirubin 0.57 0.20 - 1.20 mg/dL   Alkaline Phosphatase 74 40 - 150 U/L   AST 11 5 - 34 U/L   ALT 10 0 - 55 U/L   Total Protein 6.4 6.4 - 8.3 g/dL   Albumin 3.2 (L) 3.5 - 5.0 g/dL   Calcium 9.8 8.4 - 10.4 mg/dL   Anion Gap 9 3 - 11 mEq/L   EGFR >90 >90 ml/min/1.73 m2  CA 125  Result Value Ref Range   CA 125 79 (H) <35 U/mL  CA 19.9  Result Value Ref Range   CA 19-9 66.3 (H) <35.0 U/mL    Markers above had been 33 and 19.8 respectively on day of last chemo 10-31-14. These resulted after appointment today.  Studies/Results: CT CHEST, ABDOMEN, AND PELVIS WITH CONTRAST  11-24-14  COMPARISON: CT abdomen pelvis 07/11/2014, 07/05/2014, MR abdomen 06/26/2014, CT abdomen pelvis 06/09/2014 and 03/23/2012.  FINDINGS: CT CHEST FINDINGS  Right IJ Port-A-Cath terminates at the SVC RA junction. No pathologically enlarged mediastinal, hilar or axillary lymph nodes. Heart size normal. No pericardial effusion.  Trace right pleural fluid, decreased. Loculated low density along the right atrium measures 1.4 x 2.4 cm (series 2, image 2), previously 2.3 x 3.3 cm. Left pleural effusion has resolved. Image quality is somewhat degraded by respiratory motion. Lungs are otherwise clear. Airway is unremarkable.  CT ABDOMEN AND PELVIS FINDINGS  Hepatobiliary: Hyper attenuating lesion in the right hepatic lobe measures 1.4 cm (series 2, image 38), stable from 03/23/2012 and therefore benign. Liver is otherwise unremarkable. Question high attenuation sludge in the gallbladder. No biliary ductal dilatation.  Pancreas: A truncated appearance is again seen in the pancreatic tail with haziness and stranding in the adjacent soft tissues. A discrete measurable mass is not readily identified. Findings are stable or minimally less prominent than on 07/11/2014. No ductal dilatation. Pancreas is otherwise unremarkable.  Spleen: Negative.  Adrenals/Urinary Tract: Adrenal glands and kidneys  are unremarkable. Ureters are decompressed. There may be slight bladder wall thickening but the bladder is under distended.  Stomach/Bowel: Stomach and small bowel are unremarkable. A fair amount of stool is seen in the colon. Patient is reportedly status post appendectomy.  Vascular/Lymphatic: Atherosclerotic calcification of the arterial vasculature without abdominal aortic aneurysm. Splenic vein thrombosis with vascular collaterals, as before. No pathologically enlarged lymph nodes.  Reproductive: Hysterectomy. The ovaries are not readily identified.  Other: Small scattered ascites. Trace omental thickening/haziness, stable. Postoperative changes in the ventral abdominal and pelvic wall.  Musculoskeletal: No worrisome lytic or sclerotic lesions.  IMPRESSION: 1. Truncated appearance of the pancreatic tail with mild surrounding stranding and associated splenic vein thrombosis. Findings are unchanged from prior exams and may be due to prior pancreatitis. 2. Trace omental thickening/haziness, unchanged and possibly  postoperative in etiology. Difficult to exclude metastatic disease. 3. Interval decrease in size of a pericardial cyst versus loculated pleural fluid in the right hemi thorax. 4. Trace residual white pleural fluid. 5. Fair amount of stool in the colon is indicative of constipation. 6. Small scattered ascites.  PACs images reviewed and CT information as above discussed with patient and husband now.    Medications: I have reviewed the patient's current medications. For neuropathy that preceded taxol, she recalls that elavil and lyrica were not helpful and that she had diplopia with gabapentin. As she now has component of taxol peripheral neuropathy, she agrees to trying elavil again, will start with 25 mg at hs. She is informed of drowsiness and dry mouth from elavil. Recommend change phenergan to prn for nausea and laxative daily  DISCUSSION: CT as above. With  significant peripheral neuropathy, I have suggested continuing single agent carboplatin for now.  Urologic history reviewed in detail with patient as above. As no clear progression of cancer in pelvis and with this history/ possible bladder wall thickening on CT, will as Dr Nila Nephew to see, next available 12-16-13 per my discussion with his office now. Information to be faxed and husband will pick up disc of CT AP to take to that appointment.  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 Dose dense carbo taxol x 4 cycles + day 1 cycle 5 from 8-28 thru 10-31-14, with neupogen used at home. Good partial response by scans, peripheral neuropathy contraindicates further taxol. Markers increased during 4 week chemo break. Will resume with [place] only next week. 2. Diabetes: on oral agent prior to this diagnosis. She will not check blood sugars at home despite requests. 3.SBO postoperatively: resolved with conservative care. Constipation due to narcotics and chemo may be contributing to abdominal discomfort, increase laxatives. 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 Significant progression in feet by last taxol. Not appropriate to continue taxol. 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 Symptoms now seem similar. Appointment scheduled now with Dr Nila Nephew for early Jan. Patient may want to contact that office in case any cancellations prior to Jan 5 would let her be seen sooner. 11.Does not have advance directives   12. PAC in   Consent for [place] obtained. Has antiemetics at home. Chemo orders placed, EMEND added to antiemetics and IVF increased. Will need skin tests starting cycle 7, not yet discussed. Message to RN for records to Dr Nila Nephew. She will give neupogen 300 at home days 2 and 3 after each carboplatin. Patient and husband understand all of discussion and are in agreement with plans above; they will call prior to next scheduled appointment if needed. Time spent 40 min including >50% counseling and coordination of care.    Gordy Levan, MD

## 2014-12-09 ENCOUNTER — Ambulatory Visit (HOSPITAL_BASED_OUTPATIENT_CLINIC_OR_DEPARTMENT_OTHER): Payer: Managed Care, Other (non HMO)

## 2014-12-09 ENCOUNTER — Other Ambulatory Visit (HOSPITAL_BASED_OUTPATIENT_CLINIC_OR_DEPARTMENT_OTHER): Payer: Managed Care, Other (non HMO)

## 2014-12-09 ENCOUNTER — Telehealth: Payer: Self-pay

## 2014-12-09 DIAGNOSIS — C252 Malignant neoplasm of tail of pancreas: Secondary | ICD-10-CM

## 2014-12-09 DIAGNOSIS — C561 Malignant neoplasm of right ovary: Secondary | ICD-10-CM

## 2014-12-09 DIAGNOSIS — Z5111 Encounter for antineoplastic chemotherapy: Secondary | ICD-10-CM

## 2014-12-09 LAB — COMPREHENSIVE METABOLIC PANEL (CC13)
ALK PHOS: 69 U/L (ref 40–150)
ALT: <6 U/L (ref 0–55)
AST: 9 U/L (ref 5–34)
Albumin: 3.2 g/dL — ABNORMAL LOW (ref 3.5–5.0)
Anion Gap: 10 mEq/L (ref 3–11)
BILIRUBIN TOTAL: 0.59 mg/dL (ref 0.20–1.20)
BUN: 9.6 mg/dL (ref 7.0–26.0)
CO2: 28 meq/L (ref 22–29)
CREATININE: 0.7 mg/dL (ref 0.6–1.1)
Calcium: 9.2 mg/dL (ref 8.4–10.4)
Chloride: 98 mEq/L (ref 98–109)
EGFR: >90 mL/min/{1.73_m2} (ref >90)
GLUCOSE: 146 mg/dL — AB (ref 70–140)
Potassium: 4.3 mEq/L (ref 3.5–5.1)
Sodium: 137 mEq/L (ref 136–145)
Total Protein: 6 g/dL — ABNORMAL LOW (ref 6.4–8.3)

## 2014-12-09 LAB — CBC WITH DIFFERENTIAL/PLATELET
BASO%: 0.2 % (ref 0.0–2.0)
Basophils Absolute: 0 10*3/uL (ref 0.0–0.1)
EOS%: 1.5 % (ref 0.0–7.0)
Eosinophils Absolute: 0.1 10*3/uL (ref 0.0–0.5)
HCT: 31.9 % — ABNORMAL LOW (ref 34.8–46.6)
HGB: 10.4 g/dL — ABNORMAL LOW (ref 11.6–15.9)
LYMPH%: 33.1 % (ref 14.0–49.7)
MCH: 32.2 pg (ref 25.1–34.0)
MCHC: 32.6 g/dL (ref 31.5–36.0)
MCV: 98.8 fL (ref 79.5–101.0)
MONO#: 0.3 10*3/uL (ref 0.1–0.9)
MONO%: 6.1 % (ref 0.0–14.0)
NEUT%: 59.1 % (ref 38.4–76.8)
NEUTROS ABS: 3.2 10*3/uL (ref 1.5–6.5)
Platelets: 146 10*3/uL (ref 145–400)
RBC: 3.23 10*6/uL — ABNORMAL LOW (ref 3.70–5.45)
RDW: 14.8 % — ABNORMAL HIGH (ref 11.2–14.5)
WBC: 5.4 10*3/uL (ref 3.9–10.3)
lymph#: 1.8 10*3/uL (ref 0.9–3.3)

## 2014-12-09 MED ORDER — ONDANSETRON 16 MG/50ML IVPB (CHCC)
16.0000 mg | Freq: Once | INTRAVENOUS | Status: AC
  Administered 2014-12-09: 16 mg via INTRAVENOUS

## 2014-12-09 MED ORDER — SODIUM CHLORIDE 0.9 % IV SOLN
403.6000 mg | Freq: Once | INTRAVENOUS | Status: AC
  Administered 2014-12-09: 400 mg via INTRAVENOUS
  Filled 2014-12-09: qty 40

## 2014-12-09 MED ORDER — SODIUM CHLORIDE 0.9 % IJ SOLN
10.0000 mL | INTRAMUSCULAR | Status: DC | PRN
Start: 2014-12-09 — End: 2014-12-09
  Administered 2014-12-09: 10 mL
  Filled 2014-12-09: qty 10

## 2014-12-09 MED ORDER — DEXAMETHASONE SODIUM PHOSPHATE 10 MG/ML IJ SOLN
10.0000 mg | Freq: Once | INTRAMUSCULAR | Status: AC
  Administered 2014-12-09: 10 mg via INTRAVENOUS

## 2014-12-09 MED ORDER — SODIUM CHLORIDE 0.9 % IV SOLN
Freq: Once | INTRAVENOUS | Status: AC
  Administered 2014-12-09: 16:00:00 via INTRAVENOUS

## 2014-12-09 MED ORDER — DEXAMETHASONE SODIUM PHOSPHATE 10 MG/ML IJ SOLN
INTRAMUSCULAR | Status: AC
  Filled 2014-12-09: qty 1

## 2014-12-09 MED ORDER — ONDANSETRON 16 MG/50ML IVPB (CHCC)
INTRAVENOUS | Status: AC
Start: 2014-12-09 — End: 2014-12-09
  Filled 2014-12-09: qty 16

## 2014-12-09 MED ORDER — SODIUM CHLORIDE 0.9 % IV SOLN
150.0000 mg | Freq: Once | INTRAVENOUS | Status: AC
  Administered 2014-12-09: 150 mg via INTRAVENOUS
  Filled 2014-12-09: qty 5

## 2014-12-09 MED ORDER — HEPARIN SOD (PORK) LOCK FLUSH 100 UNIT/ML IV SOLN
500.0000 [IU] | Freq: Once | INTRAVENOUS | Status: AC | PRN
  Administered 2014-12-09: 500 [IU]
  Filled 2014-12-09: qty 5

## 2014-12-09 NOTE — Telephone Encounter (Signed)
Faxed office notes and labs as noted below by Dr. Marko Plume  to Dr. Nila Nephew. Ms. Pridgen has 2 neupogen injections at home and a refill remaining.  She verbalized understanding to take x 2 days after [place] treatments. Discussed the addition of Emend to antiemetics as noted below by Dr. Marko Plume,

## 2014-12-09 NOTE — Telephone Encounter (Signed)
-----   Message from Gordy Levan, MD sent at 12/06/2014 12:04 PM EST ----- Please fax my note 12-01-14 + CT AP 11-24-14 + latest CBC, CMET , UA C&S to attn Dr Joie Bimler fax [phone] (phone [phone] urology [place]) for visit 12-16-14.  Please be sure she has neupogen, may need refill. 300 x2 days after each [place].  Please let her know that I have added emend to chemo premeds here, which may help nausea for the next few days after chemo a little better.  thanks

## 2014-12-09 NOTE — Patient Instructions (Signed)
[facility] Discharge Instructions for Patients Receiving Chemotherapy  Today you received the following chemotherapy agents carboplatin  To help prevent nausea and vomiting after your treatment, we encourage you to take your nausea medication  Promethazine  If you develop nausea and vomiting that is not controlled by your nausea medication, call the clinic.   BELOW ARE SYMPTOMS THAT SHOULD BE REPORTED IMMEDIATELY:  *FEVER GREATER THAN 100.5 F  *CHILLS WITH OR WITHOUT FEVER  NAUSEA AND VOMITING THAT IS NOT CONTROLLED WITH YOUR NAUSEA MEDICATION  *UNUSUAL SHORTNESS OF BREATH  *UNUSUAL BRUISING OR BLEEDING  TENDERNESS IN MOUTH AND THROAT WITH OR WITHOUT PRESENCE OF ULCERS  *URINARY PROBLEMS  *BOWEL PROBLEMS  UNUSUAL RASH Items with * indicate a potential emergency and should be followed up as soon as possible.  Feel free to call the clinic you have any questions or concerns. The clinic phone number is [phone].

## 2014-12-10 ENCOUNTER — Encounter: Payer: Self-pay | Admitting: Oncology

## 2014-12-11 ENCOUNTER — Telehealth: Payer: Self-pay

## 2014-12-11 ENCOUNTER — Other Ambulatory Visit: Payer: Self-pay | Admitting: Oncology

## 2014-12-11 ENCOUNTER — Other Ambulatory Visit: Payer: Self-pay

## 2014-12-11 DIAGNOSIS — C561 Malignant neoplasm of right ovary: Secondary | ICD-10-CM

## 2014-12-11 MED ORDER — FILGRASTIM 300 MCG/ML IJ SOLN: INTRAMUSCULAR | Status: DC

## 2014-12-11 NOTE — Telephone Encounter (Signed)
In addition to financial issues,Ms. Keeny stated that she notice a mass in the LLQ of her abdomen last evening the size of a tennis ball.  It is firm.  She is afebrile.  Her abdomen is more sore today as she was pressing on the abdomen a lot last evening. Told her if she runs a fever of 101.5, has significant increase in pain or nausea or vomiting to go the ED.  Patient verbalized understanding. Her next appointment is 12-25-13 with Dr. Marko Plume.  She would like to see Dr. Marko Plume sooner with the development of this mass. Told her that this information would be reviewed with Dr. Marko Plume 12-15-14.

## 2014-12-11 NOTE — Telephone Encounter (Signed)
Kristin Meadows called last evening stating that she had to return to work 12-15-13 or she would be terminated.  Dr. Marko Plume wrote a letter stating that Kristin Meadows could go back to light duty but thought she should lok into long term disability at this time.

## 2014-12-11 NOTE — Telephone Encounter (Signed)
Ms. Eke called this am stating that she is not up to returning to work.  Her husband is looking in to insurance under the affordable healthcare act to see if they would qualify for a subsidy.   Her current insurance will terminate on January 11, 2015.  They are working with someone in [place] who works on disability claims.  They are also trying to see if they qualify for any financial assistance for mortage and utilities. They would be open to speaking with [place] regarding assistance.  Told them that this nurse would inform  Lauren of the situation and request she contact them on home phone or cell phone. They are appreciative of any assistance.

## 2014-12-15 ENCOUNTER — Encounter: Payer: Self-pay | Admitting: Oncology

## 2014-12-15 ENCOUNTER — Observation Stay (HOSPITAL_COMMUNITY)
Admission: EM | Admit: 2014-12-15 | Discharge: 2014-12-15 | Disposition: A | Discharge status: Home / Self Care | Payer: Managed Care, Other (non HMO) | Attending: Emergency Medicine | Admitting: Emergency Medicine

## 2014-12-15 ENCOUNTER — Emergency Department (HOSPITAL_COMMUNITY): Payer: Managed Care, Other (non HMO)

## 2014-12-15 ENCOUNTER — Encounter (HOSPITAL_COMMUNITY): Payer: Self-pay | Admitting: Emergency Medicine

## 2014-12-15 ENCOUNTER — Telehealth: Payer: Self-pay | Admitting: Oncology

## 2014-12-15 ENCOUNTER — Ambulatory Visit: Payer: Managed Care, Other (non HMO) | Admitting: Oncology

## 2014-12-15 DIAGNOSIS — M1990 Unspecified osteoarthritis, unspecified site: Secondary | ICD-10-CM | POA: Insufficient documentation

## 2014-12-15 DIAGNOSIS — Z90710 Acquired absence of both cervix and uterus: Secondary | ICD-10-CM | POA: Diagnosis not present

## 2014-12-15 DIAGNOSIS — J90 Pleural effusion, not elsewhere classified: Secondary | ICD-10-CM | POA: Insufficient documentation

## 2014-12-15 DIAGNOSIS — E119 Type 2 diabetes mellitus without complications: Secondary | ICD-10-CM | POA: Insufficient documentation

## 2014-12-15 DIAGNOSIS — N390 Urinary tract infection, site not specified: Principal | ICD-10-CM | POA: Insufficient documentation

## 2014-12-15 DIAGNOSIS — C561 Malignant neoplasm of right ovary: Secondary | ICD-10-CM | POA: Diagnosis not present

## 2014-12-15 DIAGNOSIS — R1030 Lower abdominal pain, unspecified: Secondary | ICD-10-CM | POA: Diagnosis present

## 2014-12-15 DIAGNOSIS — N3020 Other chronic cystitis without hematuria: Secondary | ICD-10-CM

## 2014-12-15 DIAGNOSIS — Z7982 Long term (current) use of aspirin: Secondary | ICD-10-CM | POA: Insufficient documentation

## 2014-12-15 DIAGNOSIS — F419 Anxiety disorder, unspecified: Secondary | ICD-10-CM | POA: Diagnosis not present

## 2014-12-15 DIAGNOSIS — I10 Essential (primary) hypertension: Secondary | ICD-10-CM | POA: Insufficient documentation

## 2014-12-15 DIAGNOSIS — G8929 Other chronic pain: Secondary | ICD-10-CM

## 2014-12-15 DIAGNOSIS — K219 Gastro-esophageal reflux disease without esophagitis: Secondary | ICD-10-CM | POA: Diagnosis not present

## 2014-12-15 DIAGNOSIS — C786 Secondary malignant neoplasm of retroperitoneum and peritoneum: Secondary | ICD-10-CM | POA: Diagnosis not present

## 2014-12-15 DIAGNOSIS — C569 Malignant neoplasm of unspecified ovary: Secondary | ICD-10-CM

## 2014-12-15 DIAGNOSIS — R609 Edema, unspecified: Secondary | ICD-10-CM

## 2014-12-15 DIAGNOSIS — K449 Diaphragmatic hernia without obstruction or gangrene: Secondary | ICD-10-CM | POA: Diagnosis not present

## 2014-12-15 DIAGNOSIS — I447 Left bundle-branch block, unspecified: Secondary | ICD-10-CM | POA: Insufficient documentation

## 2014-12-15 DIAGNOSIS — C801 Malignant (primary) neoplasm, unspecified: Secondary | ICD-10-CM

## 2014-12-15 DIAGNOSIS — R109 Unspecified abdominal pain: Secondary | ICD-10-CM | POA: Diagnosis present

## 2014-12-15 HISTORY — DX: Malignant (primary) neoplasm, unspecified: C80.1

## 2014-12-15 LAB — COMPREHENSIVE METABOLIC PANEL
ALT: 16 U/L (ref 0–35)
ANION GAP: 8 (ref 5–15)
AST: 27 U/L (ref 0–37)
Albumin: 4.1 g/dL (ref 3.5–5.2)
Alkaline Phosphatase: 99 U/L (ref 39–117)
BUN: 12 mg/dL (ref 6–23)
CALCIUM: 9.7 mg/dL (ref 8.4–10.5)
CO2: 27 mmol/L (ref 19–32)
CREATININE: 0.54 mg/dL (ref 0.50–1.10)
Chloride: 100 mEq/L (ref 96–112)
GFR calc Af Amer: >90 mL/min (ref >90)
Glucose, Bld: 159 mg/dL — ABNORMAL HIGH (ref 70–99)
Potassium: 4 mmol/L (ref 3.5–5.1)
Sodium: 135 mmol/L (ref 135–145)
Total Bilirubin: 1 mg/dL (ref 0.3–1.2)
Total Protein: 7 g/dL (ref 6.0–8.3)

## 2014-12-15 LAB — URINALYSIS, ROUTINE W REFLEX MICROSCOPIC
GLUCOSE, UA: NEGATIVE mg/dL
HGB URINE DIPSTICK: NEGATIVE
Ketones, ur: 40 mg/dL — AB
Nitrite: NEGATIVE
Protein, ur: 100 mg/dL — AB
Specific Gravity, Urine: 1.022 (ref 1.005–1.030)
Urobilinogen, UA: 1 mg/dL (ref 0.0–1.0)
pH: 7.5 (ref 5.0–8.0)

## 2014-12-15 LAB — CBC WITH DIFFERENTIAL/PLATELET
Basophils Absolute: 0 10*3/uL (ref 0.0–0.1)
Basophils Relative: 0 % (ref 0–1)
EOS ABS: 0 10*3/uL (ref 0.0–0.7)
Eosinophils Relative: 0 % (ref 0–5)
HCT: 37.8 % (ref 36.0–46.0)
HEMOGLOBIN: 12.3 g/dL (ref 12.0–15.0)
Lymphocytes Relative: 13 % (ref 12–46)
Lymphs Abs: 1.3 10*3/uL (ref 0.7–4.0)
MCH: 31.9 pg (ref 26.0–34.0)
MCHC: 32.5 g/dL (ref 30.0–36.0)
MCV: 98.2 fL (ref 78.0–100.0)
MONO ABS: 1 10*3/uL (ref 0.1–1.0)
MONOS PCT: 10 % (ref 3–12)
Neutro Abs: 7.6 10*3/uL (ref 1.7–7.7)
Neutrophils Relative %: 77 % (ref 43–77)
Platelets: 251 10*3/uL (ref 150–400)
RBC: 3.85 MIL/uL — ABNORMAL LOW (ref 3.87–5.11)
RDW: 14.6 % (ref 11.5–15.5)
WBC: 10 10*3/uL (ref 4.0–10.5)

## 2014-12-15 LAB — URINE MICROSCOPIC-ADD ON

## 2014-12-15 LAB — I-STAT CG4 LACTIC ACID, ED: Lactic Acid, Venous: 2.08 mmol/L (ref 0.5–2.2)

## 2014-12-15 LAB — LIPASE, BLOOD: Lipase: 14 U/L (ref 11–59)

## 2014-12-15 LAB — TROPONIN I

## 2014-12-15 MED ORDER — IOHEXOL 300 MG/ML  SOLN
25.0000 mL | Freq: Once | INTRAMUSCULAR | Status: AC | PRN
  Administered 2014-12-15: 25 mL via ORAL

## 2014-12-15 MED ORDER — HYDROMORPHONE HCL 1 MG/ML IJ SOLN
1.0000 mg | Freq: Once | INTRAMUSCULAR | Status: AC
  Administered 2014-12-15: 1 mg via INTRAVENOUS
  Filled 2014-12-15: qty 1

## 2014-12-15 MED ORDER — HYDROMORPHONE HCL 1 MG/ML IJ SOLN
1.0000 mg | Freq: Once | INTRAMUSCULAR | Status: AC
Start: 2014-12-15 — End: 2014-12-15
  Administered 2014-12-15: 1 mg via INTRAVENOUS
  Filled 2014-12-15: qty 1

## 2014-12-15 MED ORDER — SODIUM CHLORIDE 0.9 % IV BOLUS (SEPSIS)
1000.0000 mL | Freq: Once | INTRAVENOUS | Status: AC
  Administered 2014-12-15: 1000 mL via INTRAVENOUS

## 2014-12-15 MED ORDER — ONDANSETRON HCL 4 MG/2ML IJ SOLN
4.0000 mg | Freq: Once | INTRAMUSCULAR | Status: AC
  Administered 2014-12-15: 4 mg via INTRAVENOUS
  Filled 2014-12-15: qty 2

## 2014-12-15 MED ORDER — HYDROMORPHONE HCL 1 MG/ML IJ SOLN
1.0000 mg | Freq: Once | INTRAMUSCULAR | Status: DC
  Filled 2014-12-15: qty 1

## 2014-12-15 MED ORDER — PHENAZOPYRIDINE HCL 100 MG PO TABS
100.0000 mg | ORAL_TABLET | Freq: Three times a day (TID) | ORAL | Status: DC | PRN
  Administered 2014-12-15: 100 mg via ORAL
  Filled 2014-12-15: qty 1

## 2014-12-15 MED ORDER — HYDROMORPHONE HCL 1 MG/ML IJ SOLN
1.0000 mg | Freq: Once | INTRAMUSCULAR | Status: AC
  Administered 2014-12-15: 1 mg via INTRAVENOUS

## 2014-12-15 MED ORDER — DEXTROSE 5 % IV SOLN
1.0000 g | Freq: Once | INTRAVENOUS | Status: AC
  Administered 2014-12-15: 1 g via INTRAVENOUS
  Filled 2014-12-15: qty 10

## 2014-12-15 MED ORDER — IOHEXOL 300 MG/ML  SOLN
80.0000 mL | Freq: Once | INTRAMUSCULAR | Status: AC | PRN
  Administered 2014-12-15: 100 mL via INTRAVENOUS

## 2014-12-15 MED ORDER — ONDANSETRON HCL 4 MG/2ML IJ SOLN
4.0000 mg | Freq: Once | INTRAMUSCULAR | Status: AC
Start: 2014-12-15 — End: 2014-12-15
  Administered 2014-12-15: 4 mg via INTRAVENOUS
  Filled 2014-12-15: qty 2

## 2014-12-15 MED ORDER — CEPHALEXIN 500 MG PO CAPS: 500.0000 mg | ORAL_CAPSULE | Freq: Two times a day (BID) | ORAL | Status: DC

## 2014-12-15 MED ORDER — PHENAZOPYRIDINE HCL 200 MG PO TABS: 200.0000 mg | ORAL_TABLET | Freq: Three times a day (TID) | ORAL | Status: DC

## 2014-12-15 MED ORDER — PROMETHAZINE HCL 25 MG/ML IJ SOLN
12.5000 mg | Freq: Once | INTRAMUSCULAR | Status: AC
  Administered 2014-12-15: 12.5 mg via INTRAVENOUS
  Filled 2014-12-15: qty 1

## 2014-12-15 NOTE — ED Notes (Signed)
Pt with Hx of cancer, last chemo treatment 2 weeks ago, c/o cramping, tender, abdominal pain, nausea, constipation.

## 2014-12-15 NOTE — ED Notes (Signed)
Dr. Marily Memos made aware that patient's nausea is relieved.

## 2014-12-15 NOTE — ED Provider Notes (Addendum)
CSN: [id number]     Arrival date & time 12/15/14  1427 History   First MD Initiated Contact with Patient 12/15/14 1454     Chief Complaint  Patient presents with  . Abdominal Pain    cancer, chemo    Patient is a 59 y.o. female presenting with abdominal pain. The history is provided by the patient. No language interpreter was used.  Abdominal Pain  Kristin Meadows has a history of ovarian cancer currently on chemotherapy here for evaluation of lower abdominal pain. She reports that she started feeling not well last night with decreased ability to pass flatus, this morning she developed sharp and crampy lower abdominal pain and nausea. She recently started vomiting this afternoon. She feels as if the left side of her abdomen is more swollen and is more gurgly than the other side. She states she had a CT scan of her abdomen 2-3 weeks ago that showed she was constipated but nothing else. She has not had similar symptoms previously. She denies any fevers, chest pain, cough. Symptoms are severe, constant, worsening.  Past Medical History  Diagnosis Date  . Hypertension   . Left bundle branch block (LBBB) age 7  . Anxiety   . GERD (gastroesophageal reflux disease)   . H/O hiatal hernia     small  . Arthritis   . Idiopathic neuropathy     biochemical  . Meningioma     x2  . Diabetes mellitus without complication     type 2 metformin  . Cancer     ovarian   Past Surgical History  Procedure Laterality Date  . Abdominal hysterectomy      with left salpingo-oophorectomy, performed via laparotomy for edometriosis at age 38  . Inguinal hernia repair Bilateral 2008  . Appendectomy  early 30's    ruptured for a month before surgery  . Knee surgery Bilateral 2006    x2 on right, x1 left  . Salpingoophorectomy Right 07/01/2014    Procedure: EXPLORATORY LAPAROTOMY/RIGHT SALPINGO OOPHORECTOMY, OMENTECTOMY, TUMOR DEBULKING;  Surgeon: Everitt Amber, MD;  Location: [facility];  Service: Gynecology;   Laterality: Right;  . Abdominal surgery    . Esophagogastroduodenoscopy (egd) with propofol N/A 07/30/2014    Procedure: ESOPHAGOGASTRODUODENOSCOPY (EGD) WITH PROPOFOL;  Surgeon: Arta Silence, MD;  Location: [facility];  Service: Endoscopy;  Laterality: N/A;  . Eus N/A 07/30/2014    Procedure: ESOPHAGEAL ENDOSCOPIC ULTRASOUND (EUS) RADIAL;  Surgeon: Arta Silence, MD;  Location: [facility];  Service: Endoscopy;  Laterality: N/A;   Family History  Problem Relation Age of Onset  . Anesthesia problems Cousin     colon   History  Substance Use Topics  . Smoking status: Never Smoker   . Smokeless tobacco: Never Used  . Alcohol Use: No   OB History    No data available     Review of Systems  Gastrointestinal: Positive for abdominal pain.  All other systems reviewed and are negative.     Allergies  Fluzone; Penicillins; Sulfa antibiotics; and Tape  Home Medications   Prior to Admission medications   Medication Sig Start Date End Date Taking? Authorizing Provider  amitriptyline (ELAVIL) 25 MG tablet Take 1 tablet (25 mg total) by mouth at bedtime. 12/01/14   Lennis Marion Downer, MD  aspirin EC 81 MG tablet Take 81 mg by mouth every morning.     Historical Provider, MD  baclofen (LIORESAL) 10 MG tablet Take 10 mg by mouth 3 (three) times daily as needed for  muscle spasms.     Historical Provider, MD  dexamethasone (DECADRON) 4 MG tablet Take 5 tabs with food 12 hours prior to Taxol Chemotherapy. 09/15/14   Lennis Marion Downer, MD  docusate sodium (COLACE) 100 MG capsule Take 200 mg by mouth 2 (two) times daily.    Historical Provider, MD  ferrous fumarate (HEMOCYTE) 325 (106 FE) MG TABS tablet Take 1 tablet daily on an empty stomach with OJ or 500 mg Vitamin C tablet 10/10/14   Lennis P Livesay, MD  filgrastim (NEUPOGEN) 300 MCG/ML injection Inject 300 mcg SQ daily x 2 days after chemotherapy or as directed. 12/11/14   Lennis Marion Downer, MD  fluconazole (DIFLUCAN) 100 MG tablet Take 1  tablet by mouth daily for 7 days. 11/18/14   Lennis Marion Downer, MD  fluticasone (FLONASE) 50 MCG/ACT nasal spray Place 1 spray into both nostrils daily as needed for allergies.     Historical Provider, MD  furosemide (LASIX) 20 MG tablet Take 20 mg by mouth daily as needed for edema.     Historical Provider, MD  HYDROmorphone (DILAUDID) 2 MG tablet Take 1-2 tablets (2-4 mg total) by mouth every 4 (four) hours as needed for severe pain. 12/01/14   Lennis Marion Downer, MD  lidocaine-prilocaine (EMLA) cream Apply 1 application topically as needed. 08/26/14   Lennis Marion Downer, MD  LORazepam (ATIVAN) 0.5 MG tablet Place 1-2  tablet under the tongue or swallow every 6 as needed for nausea. 12/01/14   Lennis Marion Downer, MD  losartan (COZAAR) 100 MG tablet Take 100 mg by mouth every morning.     Historical Provider, MD  metFORMIN (GLUCOPHAGE-XR) 500 MG 24 hr tablet Take 1,000 mg by mouth 2 (two) times daily.     Historical Provider, MD  NEEDLE, DISP, 25 G (BD DISP NEEDLES) 25G X 5/8" MISC Use SQ as directed for Neupogen injections 09/23/14   Lennis P Livesay, MD  omeprazole (PRILOSEC) 20 MG capsule Take 20 mg by mouth daily.    Historical Provider, MD  ondansetron (ZOFRAN ODT) 8 MG disintegrating tablet Take 1-2 tablets (8-16 mg total) by mouth every 12 (twelve) hours as needed for nausea or vomiting. 08/06/14   Lennis Marion Downer, MD  oseltamivir (TAMIFLU) 75 MG capsule Take 1 capsule by mouth daily as directed for exposure to flu. Patient not taking: Reported on 12/01/2014 09/12/14   Gordy Levan, MD  oxymorphone (OPANA ER) 30 MG 12 hr tablet Take 1 tablet (30 mg total) by mouth every 12 (twelve) hours. 12/01/14   Lennis Marion Downer, MD  polyethylene glycol (MIRALAX / GLYCOLAX) packet Take 17 g by mouth daily.    Historical Provider, MD  promethazine (PHENERGAN) 25 MG tablet Take 1 tablet (25 mg total) by mouth 3 (three) times daily as needed for nausea or vomiting. 11/28/14   Lennis Marion Downer, MD  senna (SENOKOT)  8.6 MG TABS tablet Take 3 tablets by mouth 2 (two) times daily.     Historical Provider, MD  tamsulosin (FLOMAX) 0.4 MG CAPS capsule Take 1 capsule (0.4 mg total) by mouth every evening. 11/18/14   Lennis P Livesay, MD   BP 137/84 mmHg  Pulse 131  Temp(Src) 97.5 F (36.4 C) (Oral)  Resp 12  SpO2 100% Physical Exam  Constitutional: She is oriented to person, place, and time. She appears distressed.  Chronically ill-appearing  HENT:  Head: Normocephalic and atraumatic.  Cardiovascular: Regular rhythm.   No murmur heard. Tachycardic  Pulmonary/Chest: Breath sounds normal.  Abdominal:  Mildly distended abdomen with firm swelling in the left lower midline abdomen. Mild diffuse abdominal tenderness without guarding or rebound tenderness, decreased bowel sounds.  Musculoskeletal: She exhibits no tenderness.  Nonpitting edema of BLE  Neurological: She is alert and oriented to person, place, and time.  Skin: Skin is warm and dry.  Psychiatric: She has a normal mood and affect. Her behavior is normal.  Nursing note and vitals reviewed.   ED Course  Procedures (including critical care time) Labs Review Labs Reviewed  COMPREHENSIVE METABOLIC PANEL - Abnormal; Notable for the following:    Glucose, Bld 159 (*)    All other components within normal limits  CBC WITH DIFFERENTIAL - Abnormal; Notable for the following:    RBC 3.85 (*)    All other components within normal limits  URINALYSIS, ROUTINE W REFLEX MICROSCOPIC - Abnormal; Notable for the following:    Color, Urine AMBER (*)    APPearance TURBID (*)    Bilirubin Urine SMALL (*)    Ketones, ur 40 (*)    Protein, ur 100 (*)    Leukocytes, UA TRACE (*)    All other components within normal limits  URINE MICROSCOPIC-ADD ON - Abnormal; Notable for the following:    Bacteria, UA MANY (*)    All other components within normal limits  URINE CULTURE  TROPONIN I  LIPASE, BLOOD  I-STAT CG4 LACTIC ACID, ED    Imaging Review Ct  Abdomen Pelvis W Contrast  12/15/2014   CLINICAL DATA:  Progressive mid abdominal pain, nausea and vomiting. Known right ovarian cancer with omental metastases. Initial encounter.  EXAM: CT ABDOMEN AND PELVIS WITH CONTRAST  TECHNIQUE: Multidetector CT imaging of the abdomen and pelvis was performed using the standard protocol following bolus administration of intravenous contrast.  CONTRAST:  160mL OMNIPAQUE IOHEXOL 300 MG/ML SOLN, 34mL OMNIPAQUE IOHEXOL 300 MG/ML SOLN  COMPARISON:  CT of the abdomen and pelvis performed 07/11/2014, and CT of the abdomen performed 11/24/2014  FINDINGS: A trace left pleural effusion is noted. A tiny hiatal hernia is seen. 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 A 2.8 cm cystic structure adjacent to the right atrium again may reflect a pericardial cyst or foregut duplication cyst.  Small volume ascites is again noted surrounding the liver and spleen, with trace fluid tracking into the pelvis. This is worsened from the prior study, though similar in appearance to 07/11/2014.  The liver and spleen are unremarkable in appearance. Mildly increased attenuation within the gallbladder is stable from prior studies and may reflect remote vicarious excretion of contrast. The pancreas is stable in appearance; prior enlargement of the distal pancreatic body has resolved and may have reflected pancreatitis, as previously noted. The adrenal glands are grossly unremarkable in appearance.  There is worsened clumping of the small bowel at the mid and lower abdomen, extending into the pelvis, with vague soft tissue density surrounding the small bowel, raising concern for recurrent omental metastases. This is similar to the appearance on 07/11/2014, though perhaps slightly worsened, and appears worsened from the recent prior study performed 11/24/2014. There is mild associated clumping of the adjacent sigmoid colon, more prominent than on  prior studies.  There is mild distention of small-bowel loops to 3.7 cm in maximal diameter, without definite evidence of distal obstruction. This may reflect some degree of small bowel dysmotility due to the clumping seen at the lower abdomen and pelvis.  Minimal right-sided hydronephrosis is noted,  with mild left-sided pelvicaliectasis. This may be transient in nature, as there is no evidence of distal obstruction. The kidneys are otherwise unremarkable. No perinephric stranding is seen. No renal or ureteral stones are identified.  There is perhaps mildly increased stranding within the omental fat.  The stomach is otherwise within normal limits. No acute vascular abnormalities are seen.  The patient is status post appendectomy. The colon is partially filled with stool. As described above, there is slightly increased clumping of the colon at the mid sigmoid colon, with question of associated wall thickening.  The bladder is mildly distended and grossly unremarkable. The patient is status post hysterectomy. No inguinal lymphadenopathy is seen.  No acute osseous abnormalities are identified.  IMPRESSION: 1. Worsened clumping of the small bowel at the mid and lower abdomen, extending into the pelvis, with mildly worsened clumping of the adjacent sigmoid colon and mild apparent wall thickening. Given the patient's history of omental metastases, this may reflect recurrent omental metastasis, difficult to differentiate from adjacent small bowel. Mildly increased stranding suggested within the omental fat. No definite dominant masses identified. 2. Mild distention of small-bowel loops to 3.7 cm in maximal diameter, without definite evidence of distal obstruction. This may reflect some degree of associated small bowel dysmotility. 3. Small volume ascites within the abdomen and pelvis, worsened from the prior study, though similar in appearance to 07/11/2014. 4. Minimal right-sided hydronephrosis; this may be transient in  nature, as there is no definite evidence of distal obstruction. 5. Trace left pleural effusion noted. 6. Prominent gastric and splenic varices, with underlying chronic splenic vein occlusion. 7. Stable appearance to the pancreas; prior enlargement of the distal pancreatic body has resolved and may have reflected pancreatitis, as previously noted. 8. Tiny hiatal hernia noted. 9. 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   Electronically Signed   By: Garald Balding M.D.   On: 12/15/2014 18:41     EKG Interpretation   Date/Time:  Monday December 15 2014 14:49:41 EST Ventricular Rate:  129 PR Interval:  101 QRS Duration: 121 QT Interval:  415 QTC Calculation: 608 R Axis:   27 Text Interpretation:  Sinus tachycardia Left bundle branch block No  significant change was found Confirmed by CAMPOS  MD, Lennette Bihari ([id number]) on  12/15/2014 2:51:54 PM      MDM   Final diagnoses:  Acute UTI  Lower abdominal pain  Ovarian cancer, unspecified laterality     Recheck at 1600. Patient reports partial improvement in symptoms after pain medications. Abdomen is soft, there is mild lower abdominal tenderness diffusely without guarding or rebound tenderness.  Patient with history of ovarian cancer here with abdominal pain and decreased ability to pass flatus. UA is consistent with UTI, treated with Rocephin. CT scan demonstrates progression of her ovarian cancer with omental caking. There is no evidence of obstruction at this time. Discussed case with Dr. Jana Hakim - if patient's pain is controled and tolerating oral fluids she can follow up in the office tomorrow.  On recheck pt states her pain is not controlled and she cannot tolerate oral fluids, pt does not feel comfortable with d/c at this time.  D/w medicine regarding admission for further symptomatic control.    Quintella Reichert, MD 12/15/14 2259  Pt seen by Dr. Marily Memos, feeling improved on recheck and tolerating oral fluids.  Plan to d/c home  with close Oncology and Urology followup.    Quintella Reichert, MD 12/15/14 [phone]

## 2014-12-15 NOTE — Discharge Instructions (Signed)
Call the Oncology office first thing in the morning to let them know that you were seen in the Emergency Department and need to be seen.  Drink plenty of fluids.

## 2014-12-15 NOTE — Telephone Encounter (Signed)
RECEIVED A FAX FROM [facility] CONCERNING A PRIOR AUTHORIZATION FOR NEUPOGEN. THIS REQUEST WAS PLACED IN THE MANAGED CARE BIN.

## 2014-12-15 NOTE — ED Notes (Signed)
Patient tearful.  States, "I just got some bad news."  Emotional support provided to patient and family.

## 2014-12-15 NOTE — ED Notes (Addendum)
Dr. Marily Memos instructed staff to offer patient something to eat and drink.  If patient tolerates, she will likely be discharged home.  When staff offered patient something to eat, she had already eaten several peanut butter crackers and drank a 20-ounce bottle of Diet Coke.

## 2014-12-15 NOTE — H&P (Signed)
[facility] History and Physical  Quinteria Chisum MEQ:[id number] DOB: 01/27/1956 DOA: 12/15/2014  Referring physician: Dr Ayesha Rumpf Gabriel Cirri PCP: Ernestene Kiel, MD   Chief Complaint: ABD pain  HPI: Kristin Meadows is a 59 y.o. female  Abd pain started last night. Described as crampy. LLQ w/ radiation to RLQ and RUQ. Unable to pass flatus. Daily BMs. Associated w/ nausea, w/ 3-4 bouts of bilious emesis and some undigested food. Last emesis around 15:00. permethizine w/o benefit. Denies CP, SOB, palpitations, syncope. H/o intermittent episodes of emesis of undigested foods over past few months.   Dysuria for past 4 wks. Unchanged recently. Denies frequency, fevers.    Review of Systems:  Constitutional:  No night sweats, Fevers, chills,   HEENT:  No headaches, Difficulty swallowing,Tooth/dental problems,Sore throat,  No sneezing, itching, ear ache, nasal congestion, post nasal drip,  Cardio-vascular:  No chest pain, Orthopnea, PND, swelling in lower extremities, anasarca, dizziness, palpitations  GI:  Per HPI Resp:  No shortness of breath with exertion or at rest. No excess mucus, no productive cough, No non-productive cough, No coughing up of blood.No change in color of mucus.No wheezing.No chest wall deformity  Skin:  no rash or lesions.  GU:  no , change in color of urine, no urgency or frequency. No flank pain.  Musculoskeletal:  No joint pain or swelling. No decreased range of motion. No back pain.  Psych:  No change in mood or affect. No depression or anxiety. No memory loss.   Past Medical History  Diagnosis Date  . Hypertension   . Left bundle branch block (LBBB) age 58  . Anxiety   . GERD (gastroesophageal reflux disease)   . H/O hiatal hernia     small  . Arthritis   . Idiopathic neuropathy     biochemical  . Meningioma     x2  . Diabetes mellitus without complication     type 2 metformin  . Cancer     ovarian   Past Surgical History  Procedure  Laterality Date  . Abdominal hysterectomy      with left salpingo-oophorectomy, performed via laparotomy for edometriosis at age 43  . Inguinal hernia repair Bilateral 2008  . Appendectomy  early 30's    ruptured for a month before surgery  . Knee surgery Bilateral 2006    x2 on right, x1 left  . Salpingoophorectomy Right 07/01/2014    Procedure: EXPLORATORY LAPAROTOMY/RIGHT SALPINGO OOPHORECTOMY, OMENTECTOMY, TUMOR DEBULKING;  Surgeon: Everitt Amber, MD;  Location: [facility];  Service: Gynecology;  Laterality: Right;  . Abdominal surgery    . Esophagogastroduodenoscopy (egd) with propofol N/A 07/30/2014    Procedure: ESOPHAGOGASTRODUODENOSCOPY (EGD) WITH PROPOFOL;  Surgeon: Arta Silence, MD;  Location: [facility];  Service: Endoscopy;  Laterality: N/A;  . Eus N/A 07/30/2014    Procedure: ESOPHAGEAL ENDOSCOPIC ULTRASOUND (EUS) RADIAL;  Surgeon: Arta Silence, MD;  Location: [facility];  Service: Endoscopy;  Laterality: N/A;   Social History:  reports that she has never smoked. She has never used smokeless tobacco. She reports that she does not drink alcohol or use illicit drugs.  Allergies  Allergen Reactions  . Fluzone [Flu Virus Vaccine] Anaphylaxis  . Penicillins     "Paralyzed me"  . Sulfa Antibiotics     Rash  . Tape Rash    Silk tape    Family History  Problem Relation Age of Onset  . Anesthesia problems Cousin     colon     Prior to Admission medications  Medication Sig Start Date End Date Taking? Authorizing Provider  amitriptyline (ELAVIL) 25 MG tablet Take 1 tablet (25 mg total) by mouth at bedtime. 12/01/14  Yes Lennis Marion Downer, MD  aspirin EC 81 MG tablet Take 81 mg by mouth every morning.    Yes Historical Provider, MD  baclofen (LIORESAL) 10 MG tablet Take 10 mg by mouth 3 (three) times daily as needed for muscle spasms.    Yes Historical Provider, MD  dexamethasone (DECADRON) 4 MG tablet Take 5 tabs with food 12 hours prior to Taxol Chemotherapy. 09/15/14  Yes  Lennis Marion Downer, MD  docusate sodium (COLACE) 100 MG capsule Take 200 mg by mouth 2 (two) times daily.   Yes Historical Provider, MD  ferrous fumarate (HEMOCYTE) 325 (106 FE) MG TABS tablet Take 1 tablet daily on an empty stomach with OJ or 500 mg Vitamin C tablet 10/10/14  Yes Lennis P Livesay, MD  filgrastim (NEUPOGEN) 300 MCG/ML injection Inject 300 mcg SQ daily x 2 days after chemotherapy or as directed. 12/11/14  Yes Lennis Marion Downer, MD  fluticasone (FLONASE) 50 MCG/ACT nasal spray Place 1 spray into both nostrils daily as needed for allergies.    Yes Historical Provider, MD  furosemide (LASIX) 20 MG tablet Take 20 mg by mouth daily as needed for edema.    Yes Historical Provider, MD  HYDROmorphone (DILAUDID) 2 MG tablet Take 1-2 tablets (2-4 mg total) by mouth every 4 (four) hours as needed for severe pain. 12/01/14  Yes Lennis Marion Downer, MD  lidocaine-prilocaine (EMLA) cream Apply 1 application topically as needed. 08/26/14  Yes Lennis Marion Downer, MD  LORazepam (ATIVAN) 0.5 MG tablet Place 1-2  tablet under the tongue or swallow every 6 as needed for nausea. 12/01/14  Yes Lennis Marion Downer, MD  losartan (COZAAR) 100 MG tablet Take 100 mg by mouth every morning.    Yes Historical Provider, MD  metFORMIN (GLUCOPHAGE-XR) 500 MG 24 hr tablet Take 1,000 mg by mouth 2 (two) times daily.    Yes Historical Provider, MD  omeprazole (PRILOSEC) 20 MG capsule Take 20 mg by mouth daily.   Yes Historical Provider, MD  ondansetron (ZOFRAN ODT) 8 MG disintegrating tablet Take 1-2 tablets (8-16 mg total) by mouth every 12 (twelve) hours as needed for nausea or vomiting. 08/06/14  Yes Lennis P Livesay, MD  oxymorphone (OPANA ER) 30 MG 12 hr tablet Take 1 tablet (30 mg total) by mouth every 12 (twelve) hours. 12/01/14  Yes Lennis Marion Downer, MD  polyethylene glycol (MIRALAX / GLYCOLAX) packet Take 17 g by mouth daily.   Yes Historical Provider, MD  [place]   Yes Historical Provider, MD    promethazine (PHENERGAN) 25 MG tablet Take 1 tablet (25 mg total) by mouth 3 (three) times daily as needed for nausea or vomiting. 11/28/14  Yes Lennis Marion Downer, MD  senna (SENOKOT) 8.6 MG TABS tablet Take 3 tablets by mouth 2 (two) times daily.    Yes Historical Provider, MD  tamsulosin (FLOMAX) 0.4 MG CAPS capsule Take 1 capsule (0.4 mg total) by mouth every evening. 11/18/14  Yes Lennis Marion Downer, MD  fluconazole (DIFLUCAN) 100 MG tablet Take 1 tablet by mouth daily for 7 days. Patient not taking: Reported on 12/15/2014 11/18/14   Gordy Levan, MD  NEEDLE, DISP, 25 G (BD DISP NEEDLES) 25G X 5/8" MISC Use SQ as directed for Neupogen injections Patient not taking: Reported on 12/15/2014 09/23/14   Lennis P Livesay,  MD  oseltamivir (TAMIFLU) 75 MG capsule Take 1 capsule by mouth daily as directed for exposure to flu. Patient not taking: Reported on 12/15/2014 09/12/14   Gordy Levan, MD   Physical Exam: Filed Vitals:   12/15/14 1609 12/15/14 1704 12/15/14 1824 12/15/14 1848  BP: 129/74 125/80  140/80  Pulse: 99 107 100 106  Temp:      TempSrc:      Resp: 15 12 21 14   SpO2: 100% 100% 97% 100%    Wt Readings from Last 3 Encounters:  12/01/14 62.687 kg (138 lb 3.2 oz)  11/14/14 64.365 kg (141 lb 14.4 oz)  10/24/14 63.685 kg (140 lb 6.4 oz)    General:  Appears calm and comfortable Eyes:  PERRL, normal lids, irises & conjunctiva ENT:  grossly normal hearing, lips & tongue Neck:  no LAD, masses or thyromegaly Cardiovascular:  RRR, no m/r/g. Trace LE edema. Telemetry: Tachy, SR, no arrhythmias  Respiratory:  CTA bilaterally, no w/r/r. Normal respiratory effort. Abdomen: mild diffuse tendernes, NABS, soft, no masses  Skin:  no rash or induration seen on limited exam Musculoskeletal:  grossly normal tone BUE/BLE Psychiatric:  grossly normal mood and affect, speech fluent and appropriate Neurologic:  grossly non-focal.          Labs on Admission:  Basic Metabolic Panel:  Recent  Labs Lab 12/09/14 1526 12/15/14 1558  NA 137 135  K 4.3 4.0  CL  --  100  CO2 28 27  GLUCOSE 146* 159*  BUN 9.6 12  CREATININE 0.7 0.54  CALCIUM 9.2 9.7   Liver Function Tests:  Recent Labs Lab 12/09/14 1526 12/15/14 1558  AST 9 27  ALT <6 16  ALKPHOS 69 99  BILITOT 0.59 1.0  PROT 6.0* 7.0  ALBUMIN 3.2* 4.1    Recent Labs Lab 12/15/14 1558  LIPASE 14   No results for input(s): AMMONIA in the last 168 hours. CBC:  Recent Labs Lab 12/09/14 1526 12/15/14 1558  WBC 5.4 10.0  NEUTROABS 3.2 7.6  HGB 10.4* 12.3  HCT 31.9* 37.8  MCV 98.8 98.2  PLT 146 251   Cardiac Enzymes:  Recent Labs Lab 12/15/14 1558  TROPONINI <0.03    BNP (last 3 results) No results for input(s): PROBNP in the last 8760 hours. CBG: No results for input(s): GLUCAP in the last 168 hours.  Radiological Exams on Admission: Ct Abdomen Pelvis W Contrast  12/15/2014   CLINICAL DATA:  Progressive mid abdominal pain, nausea and vomiting. Known right ovarian cancer with omental metastases. Initial encounter.  EXAM: CT ABDOMEN AND PELVIS WITH CONTRAST  TECHNIQUE: Multidetector CT imaging of the abdomen and pelvis was performed using the standard protocol following bolus administration of intravenous contrast.  CONTRAST:  132mL OMNIPAQUE IOHEXOL 300 MG/ML SOLN, 42mL OMNIPAQUE IOHEXOL 300 MG/ML SOLN  COMPARISON:  CT of the abdomen and pelvis performed 07/11/2014, and CT of the abdomen performed 11/24/2014  FINDINGS: A trace left pleural effusion is noted. A tiny hiatal hernia is seen. 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 A 2.8 cm cystic structure adjacent to the right atrium again may reflect a pericardial cyst or foregut duplication cyst.  Small volume ascites is again noted surrounding the liver and spleen, with trace fluid tracking into the pelvis. This is worsened from the prior study, though similar in appearance to 07/11/2014.  The  liver and spleen are unremarkable in appearance. Mildly increased attenuation within the gallbladder is  stable from prior studies and may reflect remote vicarious excretion of contrast. The pancreas is stable in appearance; prior enlargement of the distal pancreatic body has resolved and may have reflected pancreatitis, as previously noted. The adrenal glands are grossly unremarkable in appearance.  There is worsened clumping of the small bowel at the mid and lower abdomen, extending into the pelvis, with vague soft tissue density surrounding the small bowel, raising concern for recurrent omental metastases. This is similar to the appearance on 07/11/2014, though perhaps slightly worsened, and appears worsened from the recent prior study performed 11/24/2014. There is mild associated clumping of the adjacent sigmoid colon, more prominent than on prior studies.  There is mild distention of small-bowel loops to 3.7 cm in maximal diameter, without definite evidence of distal obstruction. This may reflect some degree of small bowel dysmotility due to the clumping seen at the lower abdomen and pelvis.  Minimal right-sided hydronephrosis is noted, with mild left-sided pelvicaliectasis. This may be transient in nature, as there is no evidence of distal obstruction. The kidneys are otherwise unremarkable. No perinephric stranding is seen. No renal or ureteral stones are identified.  There is perhaps mildly increased stranding within the omental fat.  The stomach is otherwise within normal limits. No acute vascular abnormalities are seen.  The patient is status post appendectomy. The colon is partially filled with stool. As described above, there is slightly increased clumping of the colon at the mid sigmoid colon, with question of associated wall thickening.  The bladder is mildly distended and grossly unremarkable. The patient is status post hysterectomy. No inguinal lymphadenopathy is seen.  No acute osseous abnormalities  are identified.  IMPRESSION: 1. Worsened clumping of the small bowel at the mid and lower abdomen, extending into the pelvis, with mildly worsened clumping of the adjacent sigmoid colon and mild apparent wall thickening. Given the patient's history of omental metastases, this may reflect recurrent omental metastasis, difficult to differentiate from adjacent small bowel. Mildly increased stranding suggested within the omental fat. No definite dominant masses identified. 2. Mild distention of small-bowel loops to 3.7 cm in maximal diameter, without definite evidence of distal obstruction. This may reflect some degree of associated small bowel dysmotility. 3. Small volume ascites within the abdomen and pelvis, worsened from the prior study, though similar in appearance to 07/11/2014. 4. Minimal right-sided hydronephrosis; this may be transient in nature, as there is no definite evidence of distal obstruction. 5. Trace left pleural effusion noted. 6. Prominent gastric and splenic varices, with underlying chronic splenic vein occlusion. 7. Stable appearance to the pancreas; prior enlargement of the distal pancreatic body has resolved and may have reflected pancreatitis, as previously noted. 8. Tiny hiatal hernia noted. 9. 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   Electronically Signed   By: Garald Balding M.D.   On: 12/15/2014 18:41    EKG: Independently reviewed. Sinus tach, LBBB, no sign of ACS (LBBB previously noted.)   Assessment/Plan Principal Problem:   Abdominal pain Active Problems:   Hypertension   Right ovarian epithelial cancer   Chronic cystitis   Dependent edema   Diabetes mellitus type 2 in nonobese   Chronic pain   Cystadenocarcinoma  Abdominal pain: CT negative for acute pathology including SBO. 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 Much improved once given IV hydration and Zofran.  Tolerating PO in ED. Discussed case w/ Dr. Jana Hakim who recommended pt be seen in  their office tomorrow. Pts primary oncologist is Dr. Marko Plume. Pt has had these episodes off and on for several months. Pt able to tolerate po in the ED and feeling much better after zofran and phenergan. - Pt to call oncology dept in am for a same day appt - unfortunately this is likely the beginning of several more identical episodes given her disease process.   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 EKG w/ old LBBB. Pt baseline over last several office visits around 90-low 100s. IV bolus w/ improvement and w/ pain control.  CYstitis vs UTI: pts urine sample contaminated. Given CTX x1. Pt w/ ongoing h/o cystitis - pyridium - f/u w/ urology at pre-scheduled appt on 12/16/13. - UCX   HTN: normotensive at time of consult. Pt not taking hypertensive medications due to normal pressures at home. Continue w/o medications   Disposition Plan: DIscussed plan w/ Dr. Jana Hakim and Dr. Ralene Bathe and will discharge pt home w/ pyridium for cystitis and Keflex for questionable UTI. Pt w/ adequate zofran and phenergan at home       [place] [email or web address] Password [email or web address]

## 2014-12-15 NOTE — Telephone Encounter (Signed)
, °

## 2014-12-15 NOTE — Progress Notes (Signed)
Put [employer] neupogen pa form on nurse's desk

## 2014-12-16 ENCOUNTER — Telehealth: Payer: Self-pay | Admitting: Nurse Practitioner

## 2014-12-16 ENCOUNTER — Ambulatory Visit: Payer: Managed Care, Other (non HMO) | Attending: Gynecologic Oncology | Admitting: Gynecologic Oncology

## 2014-12-16 ENCOUNTER — Telehealth: Payer: Self-pay | Admitting: *Deleted

## 2014-12-16 ENCOUNTER — Encounter: Payer: Self-pay | Admitting: Gynecologic Oncology

## 2014-12-16 VITALS — BP 129/73 | HR 104 | Temp 98.0°F | Resp 16 | Ht 62.0 in | Wt 135.6 lb

## 2014-12-16 DIAGNOSIS — Z79899 Other long term (current) drug therapy: Secondary | ICD-10-CM | POA: Diagnosis not present

## 2014-12-16 DIAGNOSIS — Z90710 Acquired absence of both cervix and uterus: Secondary | ICD-10-CM | POA: Diagnosis not present

## 2014-12-16 DIAGNOSIS — R109 Unspecified abdominal pain: Secondary | ICD-10-CM | POA: Diagnosis present

## 2014-12-16 DIAGNOSIS — Z9221 Personal history of antineoplastic chemotherapy: Secondary | ICD-10-CM | POA: Diagnosis not present

## 2014-12-16 DIAGNOSIS — I10 Essential (primary) hypertension: Secondary | ICD-10-CM | POA: Diagnosis not present

## 2014-12-16 DIAGNOSIS — R1032 Left lower quadrant pain: Secondary | ICD-10-CM

## 2014-12-16 DIAGNOSIS — Z90721 Acquired absence of ovaries, unilateral: Secondary | ICD-10-CM | POA: Diagnosis not present

## 2014-12-16 DIAGNOSIS — C561 Malignant neoplasm of right ovary: Secondary | ICD-10-CM

## 2014-12-16 DIAGNOSIS — Z7982 Long term (current) use of aspirin: Secondary | ICD-10-CM | POA: Insufficient documentation

## 2014-12-16 DIAGNOSIS — E119 Type 2 diabetes mellitus without complications: Secondary | ICD-10-CM | POA: Diagnosis not present

## 2014-12-16 DIAGNOSIS — C569 Malignant neoplasm of unspecified ovary: Secondary | ICD-10-CM | POA: Insufficient documentation

## 2014-12-16 NOTE — Progress Notes (Signed)
ER Followup Note: Gyn-Onc  CC:  Chief Complaint  Patient presents with  . Abdominal Pain  ovarian cancer  Assessment/Plan:  Kristin. Kristin Meadows  is a 59 y.o.  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 She is s/p upfront debulking surgery followed by 5 cycles of adjuvant chemotherapy (4 with paclitaxel and carboplatin and 1 with single agent carboplatin). She has apparent progression of disease on CT imaging and tumor marker assessment.   I discussed with Kristin Meadows that there is evidence of disease progression on scans and blood work. I discussed that her GI symptoms and abdominal pain are likely secondary to carcinomatosis. There is no apparent obstruction on imaging that is amenable to surgical resection. I personally reviewed the patient's imaging films including the CT scan of the abdomen and pelvis from 12/15/14. 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  1/ I am recommending discontinuing carboplatin. I am recommending considering Gemcitabine chemotherapy for second line therapy for platinum resistant disease, as this is an active agent for both pancreatic and ovarian malignancies. I discussed with Kristin Meadows that hospice and palliative care are options if she does not feel that her performance status or quality of life are adequate for therapy. She is interested in pursuing chemotherapy. Her performance status is 0-1.   2/ I discussed that surgery is not an option for her GI symptoms. I counseled her about the etiology of bowel impairment with advancing peritoneal carcinomatosis. I discussed the importance of minimizing high fibre or particulate matter in foods as this may exacerbate her symptoms of SBO.   She has an appointment  with Dr Marko Plume next week. I will pass on my recommendations to Dr Marko Plume with respect to changing therapy.   with an elevated CA 125 and a complex cystic and solid adnexal mass, likely arising from the right ovary (she has a history of a supracervical hysterectomy and LSO for endometriosis at age 77.  I discussed with Kristin Meadows that I recommend the intact removal of this mass to make a definitive diagnosis regarding benign vs malignant pathology. I discussed that due to its size ( 15cm in largest dimension), I am recommending ex lap as an approach in order to maximize liklihood of an intact removal. We would send the specimen for frozen section pathology to evaluate for malignancy intraop, and if found, additional staging would be performed as indicated. She has a mildly elevated CEA (within normal limits but high for a non-smoker) and some stranding and prominence of the pancreatic tail, and therefore we will order a CA 19-9 preop. If elevated, we will order an MRI of the pancreas.  I discussed that because the mass is in close approximation with the rectum on RV exam, there is a possibility for rectal resection (or other GI resection/reanastamosis) and we discussed the possibility of colostomy/ileostomy and surgical risk associated with this. We also discussed the risks of surgery including  bleeding, infection, damage to internal organs (such as bladder,ureters, bowels), blood clot, reoperation and rehospitalization. She will hold her ASA preoperatively to reduce the risk of bleeding.  Kristin Meadows is eager to proceed as soon as possible with the scheduled exploratory laparotomy, RSO, possible staging, and we have scheduled her at our soonest available spot, however, we will also  enquire if earlier surgery is available at [facility] in [place] at her request.   HPI: Kristin Meadows is a 59 year old woman (P3) who is seen in consultation at the request of Dr Marvel Plan for a complex cystic 15cmx 8cm x11cm  pelvic mass adjacent to the right ovary. She has an elevated CA 125 (152 on 06/11/14) and  Her CEA is 3.28. This mass was found after the patient has several month history of suprapubic discomfort and negative urine cultures and failed empiric treatmetn for UTI's (with bactrim and cipro). She had imaging of the abdomen and pelvis 2 years ago that was negative for abnormalities. She has a remote history of a TAH and LSO for endometriosis at age 26.  CT of the abdomen adn pelvis (without contrast) on 06/09/14 as part of workup of this pain showed the 15cm complex cystic and solid mass, but no other symptoms concerning for metastatic ovarian cancer (no adenopathy, no ascites, no omental or peritoneal disease). There was apparent pancreatic tail "thickening" and stranding which was felt to be likely to be from vascular anatomy. There was no discrete pancreatic mass seen. Preoperative CA 125 and CA 19-9 were both elevated with CA 19-9 more greatly elevated (proportionately).  She was taken to the OR on 07/01/14 for an exploratory laparotomy, RSO, omentectomy and suboptimal tumor debulking surgery. At the time of surgery an enlarged, nodular, hard pancreatic body and tail was appreciated (concerning for malignancy). Pathology revealed mucinous cystadenocarcinoma.  In August, 2015 a biopsy of the pancreas was performed which confirmed mucinous adenocarcinoma (it was unclear if this was primary or metastatic).  She began dose dense Carboplatin and Taxol on 08/08/14 with dose limiting toxicities of peripheral neuropathy and neutropenia. She tolerated only 4 cycles of doublet therapy, before receiving her 5th cycle as single agent carboplatin. Her CA 19-9 reduced to 19.8 (10/31/14), and CA 125 reduced to 33 (10/31/14). On imaging in December 2015 she had shown incomplete response throughout the peritoneal cavity.   On 12/15/14 she began experiencing increasing cramping abdominal pain and constipation. She was seen in the [facility]  ER where a CT scan was performed which confirmed development of new carcinomatosis in the peritoneal cavity suggesting progression. There was no frank SBO or transition point. Of note, CA 125 and CA 19-9 were drawn on day 1 of cycle 5 of her carboplatin (12/02/15) and were elevated again to 79 and 66 respectively. She received IV hydration and began feeling better after taking in oral contrast from the CT scan. She is now passing flatus and BM's and has less abdominal pain.  She presents today for discussion of her test results from the ER and formation of the next treatment plan.  Current Meds:  Outpatient Encounter Prescriptions as of 12/16/2014  Medication Sig  . amitriptyline (ELAVIL) 25 MG tablet Take 1 tablet (25 mg total) by mouth at bedtime.  Marland Kitchen aspirin EC 81 MG tablet Take 81 mg by mouth every morning.   . baclofen (LIORESAL) 10 MG tablet Take 10 mg by mouth 3 (three) times daily as needed for muscle spasms.   . cephALEXin (KEFLEX) 500 MG capsule Take 1 capsule (500 mg total) by mouth 2 (two) times daily.  . ciprofloxacin (CIPRO) 250 MG tablet   . dexamethasone (DECADRON) 4 MG tablet Take 5 tabs with food 12 hours prior to Taxol Chemotherapy.  . docusate sodium (COLACE) 100 MG capsule Take 200 mg by mouth 2 (two) times daily.  . ferrous fumarate (HEMOCYTE)  325 (106 FE) MG TABS tablet Take 1 tablet daily on an empty stomach with OJ or 500 mg Vitamin C tablet  . HYDROmorphone (DILAUDID) 2 MG tablet Take 1-2 tablets (2-4 mg total) by mouth every 4 (four) hours as needed for severe pain.  Marland Kitchen lidocaine-prilocaine (EMLA) cream Apply 1 application topically as needed.  Marland Kitchen LORazepam (ATIVAN) 0.5 MG tablet Place 1-2  tablet under the tongue or swallow every 6 as needed for nausea.  . metFORMIN (GLUCOPHAGE-XR) 500 MG 24 hr tablet Take 1,000 mg by mouth 2 (two) times daily.   Marland Kitchen omeprazole (PRILOSEC) 20 MG capsule Take 20 mg by mouth daily.  . ondansetron (ZOFRAN ODT) 8 MG disintegrating tablet Take 1-2  tablets (8-16 mg total) by mouth every 12 (twelve) hours as needed for nausea or vomiting.  Marland Kitchen oxymorphone (OPANA ER) 30 MG 12 hr tablet Take 1 tablet (30 mg total) by mouth every 12 (twelve) hours.  . promethazine (PHENERGAN) 25 MG tablet Take 1 tablet (25 mg total) by mouth 3 (three) times daily as needed for nausea or vomiting.  . senna (SENOKOT) 8.6 MG TABS tablet Take 3 tablets by mouth 2 (two) times daily.   . tamsulosin (FLOMAX) 0.4 MG CAPS capsule Take 1 capsule (0.4 mg total) by mouth every evening.  . filgrastim (NEUPOGEN) 300 MCG/ML injection Inject 300 mcg SQ daily x 2 days after chemotherapy or as directed.  . fluconazole (DIFLUCAN) 100 MG tablet Take 1 tablet by mouth daily for 7 days. (Patient not taking: Reported on 12/15/2014)  . fluticasone (FLONASE) 50 MCG/ACT nasal spray Place 1 spray into both nostrils daily as needed for allergies.   . furosemide (LASIX) 20 MG tablet Take 20 mg by mouth daily as needed for edema.   Marland Kitchen losartan (COZAAR) 100 MG tablet Take 100 mg by mouth every morning.   Marland Kitchen NEEDLE, DISP, 25 G (BD DISP NEEDLES) 25G X 5/8" MISC Use SQ as directed for Neupogen injections (Patient not taking: Reported on 12/15/2014)  . oseltamivir (TAMIFLU) 75 MG capsule Take 1 capsule by mouth daily as directed for exposure to flu. (Patient not taking: Reported on 12/15/2014)  . phenazopyridine (PYRIDIUM) 200 MG tablet Take 1 tablet (200 mg total) by mouth 3 (three) times daily.  . polyethylene glycol (MIRALAX / GLYCOLAX) packet Take 17 g by mouth daily.  Marland Kitchen PRESCRIPTION MEDICATION Chemo [facility]    Allergy:  Allergies  Allergen Reactions  . Fluzone [Flu Virus Vaccine] Anaphylaxis  . Penicillins     "Paralyzed me"  . Sulfa Antibiotics     Rash  . Tape Rash    Silk tape    Social Hx:   History   Social History  . Marital Status: Married    Spouse Name: N/A    Number of Children: N/A  . Years of Education: N/A   Occupational History  . Not on file.   Social History Main  Topics  . Smoking status: Never Smoker   . Smokeless tobacco: Never Used  . Alcohol Use: No  . Drug Use: No  . Sexual Activity: Yes   Other Topics Concern  . Not on file   Social History Narrative    Past Surgical Hx:  Past Surgical History  Procedure Laterality Date  . Abdominal hysterectomy      with left salpingo-oophorectomy, performed via laparotomy for edometriosis at age 79  . Inguinal hernia repair Bilateral 2008  . Appendectomy  early 30's    ruptured for a month before  surgery  . Knee surgery Bilateral 2006    x2 on right, x1 left  . Salpingoophorectomy Right 07/01/2014    Procedure: EXPLORATORY LAPAROTOMY/RIGHT SALPINGO OOPHORECTOMY, OMENTECTOMY, TUMOR DEBULKING;  Surgeon: Everitt Amber, MD;  Location: [facility] ORS;  Service: Gynecology;  Laterality: Right;  . Abdominal surgery    . Esophagogastroduodenoscopy (egd) with propofol N/A 07/30/2014    Procedure: ESOPHAGOGASTRODUODENOSCOPY (EGD) WITH PROPOFOL;  Surgeon: Arta Silence, MD;  Location: [facility];  Service: Endoscopy;  Laterality: N/A;  . Eus N/A 07/30/2014    Procedure: ESOPHAGEAL ENDOSCOPIC ULTRASOUND (EUS) RADIAL;  Surgeon: Arta Silence, MD;  Location: [facility];  Service: Endoscopy;  Laterality: N/A;    Past Medical Hx:  Past Medical History  Diagnosis Date  . Hypertension   . Left bundle branch block (LBBB) age 41  . Anxiety   . GERD (gastroesophageal reflux disease)   . H/O hiatal hernia     small  . Arthritis   . Idiopathic neuropathy     biochemical  . Meningioma     x2  . Diabetes mellitus without complication     type 2 metformin  . Cancer     ovarian    Past Gynecological History:  LMP at time of hyst at age 80  No LMP recorded. Patient has had a hysterectomy.  Family Hx:  Family History  Problem Relation Age of Onset  . Anesthesia problems Cousin     colon    Review of Systems:  Constitutional  Feels generally unwell with nausea and bloating  ENT Normal appearing ears and  nares bilaterally Skin/Breast  No rash, sores, jaundice, itching, dryness Cardiovascular  No chest pain, shortness of breath, or edema  Pulmonary  No cough or wheeze.  Gastro Intestinal  + constipation, + nausea, + abdo pain. No bright red blood per rectum + constipation.  Genito Urinary  No frequency, urgency, dysuria, + suprapubic pain and left LLQ pain Musculo Skeletal  No myalgia, arthralgia, joint swelling or pain  Neurologic  No weakness, numbness, change in gait,  Psychology  No depression, anxiety, insomnia.   Vitals:  Blood pressure 129/73, pulse 104, temperature 98 F (36.7 C), temperature source Oral, resp. rate 16, height 5\' 2"  (1.575 m), weight 135 lb 9.6 oz (61.508 kg).  Physical Exam: WD in NAD Neck  Supple NROM, without any enlargements.  Lymph Node Survey No cervical supraclavicular or inguinal adenopathy Cardiovascular  Pulse normal rate, regularity and rhythm. S1 and S2 normal.  Lungs  Clear to auscultation bilateraly, without wheezes/crackles/rhonchi. Good air movement.  Skin  No rash/lesions/breakdown  Psychiatry  Alert and oriented to person, place, and time  Abdomen  Normoactive bowel sounds, abdomen soft, non-tender and obese without evidence of hernia.  There is a well healed vertical midline incision. Back No CVA tenderness Genito Urinary  Vulva/vagina: Normal external female genitalia.  No lesions. No discharge or bleeding.  Bladder/urethra:  No lesions or masses, well supported bladder  Vagina: nodularity at vaginal apex consistent with cul de sac disease.  Cervix: Partially surgically absent, but with no visible abnormalities.  Uterus: surgically absent   Adnexa: cul de sac nodularity. Rectal  deferred  Extremities  No bilateral cyanosis, clubbing or edema.   Donaciano Eva, MD   12/16/2014, 3:37 PM

## 2014-12-16 NOTE — Patient Instructions (Signed)
Plan to follow up with Dr. Marko Plume.  Please call for any questions or concerns.

## 2014-12-16 NOTE — Telephone Encounter (Signed)
Patient called this am.  She had been to ED and had a CT scan.  Patient states "scan shows I need a new plan for chemotherapy.   I want to see Dr. Benay Spice or Dr. Marko Plume ASAP"   Her next appt is 12/25/14 with Dr. Marko Plume.  Let her know that neither Dr. Benay Spice nor Dr. Marko Plume is here today.  I will send a message to Dr. Marko Plume about her request to be seen ASAP and when we hear back from here we will let her know.  Patient is aware that neither of these physcians is physcially in the office today, so it will not be today and she understands and verbalized this.

## 2014-12-16 NOTE — Telephone Encounter (Signed)
Per Joylene John, NP, patient scheduled to see Dr. Denman George in clinic today at 12:45 for f/u from ED visit on 12/15/13. Patient aware of appt date and time.

## 2014-12-17 LAB — URINE CULTURE

## 2014-12-18 ENCOUNTER — Telehealth: Payer: Self-pay | Admitting: *Deleted

## 2014-12-18 ENCOUNTER — Encounter: Payer: Self-pay | Admitting: *Deleted

## 2014-12-18 ENCOUNTER — Other Ambulatory Visit: Payer: Self-pay | Admitting: Oncology

## 2014-12-18 NOTE — Telephone Encounter (Signed)
Reviewed below with Darlyne. Verbalized understanding. Available anyday for chemo

## 2014-12-18 NOTE — Telephone Encounter (Signed)
Pt left a message stating she would like to see Dr Marko Plume sooner than Thursday 12/25/14. Is "feeling more distended and a lot of discomfort". Saw Dr Denman George on 1/5

## 2014-12-18 NOTE — Telephone Encounter (Signed)
-----   Message from Gordy Levan, MD sent at 12/18/2014  1:59 PM EST ----- Miralax one capful once or twice daily. If this is not enough to keep bowels moving daily, can add mag citrate daily prn. Continue colace.  CT 1-4 did not show much ascites, so lets wait on this  Please do gemzar teaching by phone.  Please set up gemzar next week, either before or day of my visit (1-14). I will put orders in so can get precert (treatment needs to be at least early next week for precert to happen)  Continue bland diet.  thanks   ----- Message -----    From: Patton Salles, RN    Sent: 12/18/2014   1:33 PM      To: Lennis Marion Downer, MD  Johnathon is anxious to get chemo started sooner than currently scheduled.   Abd distended:  1. took 1/2 bottle mag citrate yesterday with relief today. Wants to know if it is ok to continue Mag citrate or do you have other suggestions to keep her bowels moving?   2. Feels like she has more ascities than before. Wants to know if aldactone would help pull off fluid ?  She is on a bland diet per Dr Denman George suggestion on Tuesday- is more distended now.

## 2014-12-19 ENCOUNTER — Telehealth: Payer: Self-pay | Admitting: Oncology

## 2014-12-19 NOTE — Telephone Encounter (Signed)
per pof to sch pt trmt-sent MW email to sch trmt-pt aware ofg 12/25/14 appt time & date per prev notes

## 2014-12-21 ENCOUNTER — Other Ambulatory Visit: Payer: Self-pay | Admitting: Oncology

## 2014-12-21 DIAGNOSIS — C252 Malignant neoplasm of tail of pancreas: Secondary | ICD-10-CM

## 2014-12-21 DIAGNOSIS — C561 Malignant neoplasm of right ovary: Secondary | ICD-10-CM

## 2014-12-22 ENCOUNTER — Telehealth: Payer: Self-pay

## 2014-12-22 NOTE — Telephone Encounter (Signed)
Spoke with Receptionist at Dr. Ara Kussmaul office.  Kristin Meadows called and cancelled appointment for 12-16-14.

## 2014-12-22 NOTE — Telephone Encounter (Signed)
-----   Message from Gordy Levan, MD sent at 12/21/2014  6:25 PM EST ----- I believe she was to see urologist Dr Joie Bimler in [place] on ~ Jan 5. Please request office note in time for my visit 1-14 (copy to HIM and copy to my desk please). Office phone [phone]  thanks

## 2014-12-23 ENCOUNTER — Other Ambulatory Visit (HOSPITAL_BASED_OUTPATIENT_CLINIC_OR_DEPARTMENT_OTHER): Payer: Managed Care, Other (non HMO)

## 2014-12-23 ENCOUNTER — Telehealth: Payer: Self-pay | Admitting: *Deleted

## 2014-12-23 ENCOUNTER — Ambulatory Visit (HOSPITAL_BASED_OUTPATIENT_CLINIC_OR_DEPARTMENT_OTHER): Payer: Managed Care, Other (non HMO)

## 2014-12-23 DIAGNOSIS — C561 Malignant neoplasm of right ovary: Secondary | ICD-10-CM

## 2014-12-23 DIAGNOSIS — Z5111 Encounter for antineoplastic chemotherapy: Secondary | ICD-10-CM

## 2014-12-23 DIAGNOSIS — C252 Malignant neoplasm of tail of pancreas: Secondary | ICD-10-CM

## 2014-12-23 LAB — COMPREHENSIVE METABOLIC PANEL (CC13)
ALT: 8 U/L (ref 0–55)
AST: 9 U/L (ref 5–34)
Albumin: 3.3 g/dL — ABNORMAL LOW (ref 3.5–5.0)
Alkaline Phosphatase: 82 U/L (ref 40–150)
Anion Gap: 8 mEq/L (ref 3–11)
BUN: 11.7 mg/dL (ref 7.0–26.0)
CALCIUM: 9.2 mg/dL (ref 8.4–10.4)
CO2: 31 meq/L — AB (ref 22–29)
CREATININE: 0.6 mg/dL (ref 0.6–1.1)
Chloride: 98 mEq/L (ref 98–109)
EGFR: >90 mL/min/{1.73_m2} (ref >90)
Glucose: 125 mg/dl (ref 70–140)
Potassium: 3.7 mEq/L (ref 3.5–5.1)
Sodium: 137 mEq/L (ref 136–145)
Total Bilirubin: 0.37 mg/dL (ref 0.20–1.20)
Total Protein: 6.1 g/dL — ABNORMAL LOW (ref 6.4–8.3)

## 2014-12-23 LAB — CBC WITH DIFFERENTIAL/PLATELET
BASO%: 0.9 % (ref 0.0–2.0)
Basophils Absolute: 0 10*3/uL (ref 0.0–0.1)
EOS%: 1.1 % (ref 0.0–7.0)
Eosinophils Absolute: 0 10*3/uL (ref 0.0–0.5)
HEMATOCRIT: 29.9 % — AB (ref 34.8–46.6)
HGB: 9.8 g/dL — ABNORMAL LOW (ref 11.6–15.9)
LYMPH%: 29.5 % (ref 14.0–49.7)
MCH: 31.6 pg (ref 25.1–34.0)
MCHC: 32.8 g/dL (ref 31.5–36.0)
MCV: 96.6 fL (ref 79.5–101.0)
MONO#: 0.3 10*3/uL (ref 0.1–0.9)
MONO%: 6.1 % (ref 0.0–14.0)
NEUT#: 2.7 10*3/uL (ref 1.5–6.5)
NEUT%: 62.4 % (ref 38.4–76.8)
Platelets: 126 10*3/uL — ABNORMAL LOW (ref 145–400)
RBC: 3.1 10*6/uL — AB (ref 3.70–5.45)
RDW: 14.8 % — ABNORMAL HIGH (ref 11.2–14.5)
WBC: 4.3 10*3/uL (ref 3.9–10.3)
lymph#: 1.3 10*3/uL (ref 0.9–3.3)

## 2014-12-23 MED ORDER — PROCHLORPERAZINE MALEATE 10 MG PO TABS
10.0000 mg | ORAL_TABLET | Freq: Once | ORAL | Status: AC
  Administered 2014-12-23: 10 mg via ORAL

## 2014-12-23 MED ORDER — HEPARIN SOD (PORK) LOCK FLUSH 100 UNIT/ML IV SOLN
500.0000 [IU] | Freq: Once | INTRAVENOUS | Status: AC | PRN
  Administered 2014-12-23: 500 [IU]
  Filled 2014-12-23: qty 5

## 2014-12-23 MED ORDER — LORAZEPAM 2 MG/ML IJ SOLN
0.5000 mg | Freq: Once | INTRAMUSCULAR | Status: AC
  Administered 2014-12-23: 0.5 mg via INTRAVENOUS

## 2014-12-23 MED ORDER — SODIUM CHLORIDE 0.9 % IJ SOLN
10.0000 mL | INTRAMUSCULAR | Status: DC | PRN
  Administered 2014-12-23: 10 mL
  Filled 2014-12-23: qty 10

## 2014-12-23 MED ORDER — LORAZEPAM 2 MG/ML IJ SOLN
INTRAMUSCULAR | Status: AC
  Filled 2014-12-23: qty 1

## 2014-12-23 MED ORDER — SODIUM CHLORIDE 0.9 % IV SOLN
Freq: Once | INTRAVENOUS | Status: AC
  Administered 2014-12-23: 09:00:00 via INTRAVENOUS

## 2014-12-23 MED ORDER — PROCHLORPERAZINE MALEATE 10 MG PO TABS
ORAL_TABLET | ORAL | Status: AC
  Filled 2014-12-23: qty 1

## 2014-12-23 MED ORDER — SODIUM CHLORIDE 0.9 % IV SOLN
800.0000 mg/m2 | Freq: Once | INTRAVENOUS | Status: AC
  Administered 2014-12-23: 1330 mg via INTRAVENOUS
  Filled 2014-12-23: qty 34.98

## 2014-12-23 NOTE — Telephone Encounter (Signed)
Received disability forms to be filled out by Dr. Marko Plume. Patient's husband, Ed, left them at the desk with a volunteer. Called Ed and let him know I received the forms and will place on Dr. Mariana Kaufman desk for her to fill out.

## 2014-12-23 NOTE — Progress Notes (Signed)
0915: Pt reports sudden onset of nausea, no vomiting.  Pt requesting phenergan.  Selena Lesser, NP notified and verbal order received to give patient 8mg  Zofran IVPB or 0.5 mg Ativan IVP if PO compazine is not effective. Pt verbalized understanding and is in agreement with this plan. Pt instructed to notify RN if compazine does not relieve nausea.  Pt verbalized understanding and is eating sandwhich and drinking sprite.  0998: Pt reports some relief of nausea from IV Ativan.

## 2014-12-23 NOTE — Patient Instructions (Signed)
[facility] Discharge Instructions for Patients Receiving Chemotherapy  Today you received the following chemotherapy agents: Gemzar.  To help prevent nausea and vomiting after your treatment, we encourage you to take your nausea medication as prescribed.   If you develop nausea and vomiting that is not controlled by your nausea medication, call the clinic.   BELOW ARE SYMPTOMS THAT SHOULD BE REPORTED IMMEDIATELY:  *FEVER GREATER THAN 100.5 F  *CHILLS WITH OR WITHOUT FEVER  NAUSEA AND VOMITING THAT IS NOT CONTROLLED WITH YOUR NAUSEA MEDICATION  *UNUSUAL SHORTNESS OF BREATH  *UNUSUAL BRUISING OR BLEEDING  TENDERNESS IN MOUTH AND THROAT WITH OR WITHOUT PRESENCE OF ULCERS  *URINARY PROBLEMS  *BOWEL PROBLEMS  UNUSUAL RASH Items with * indicate a potential emergency and should be followed up as soon as possible.  Feel free to call the clinic you have any questions or concerns. The clinic phone number is [phone].   Gemcitabine injection What is this medicine? GEMCITABINE (jem SIT a been) is a chemotherapy drug. This medicine is used to treat many types of cancer like breast cancer, lung cancer, pancreatic cancer, and ovarian cancer. This medicine may be used for other purposes; ask your health care provider or pharmacist if you have questions. COMMON BRAND NAME(S): Gemzar What should I tell my health care provider before I take this medicine? They need to know if you have any of these conditions: -blood disorders -infection -kidney disease -liver disease -recent or ongoing radiation therapy -an unusual or allergic reaction to gemcitabine, other chemotherapy, other medicines, foods, dyes, or preservatives -pregnant or trying to get pregnant -breast-feeding How should I use this medicine? This drug is given as an infusion into a vein. It is administered in a hospital or clinic by a specially trained health care professional. Talk to your pediatrician  regarding the use of this medicine in children. Special care may be needed. Overdosage: If you think you have taken too much of this medicine contact a poison control center or emergency room at once. NOTE: This medicine is only for you. Do not share this medicine with others. What if I miss a dose? It is important not to miss your dose. Call your doctor or health care professional if you are unable to keep an appointment. What may interact with this medicine? -medicines to increase blood counts like filgrastim, pegfilgrastim, sargramostim -some other chemotherapy drugs like cisplatin -vaccines Talk to your doctor or health care professional before taking any of these medicines: -acetaminophen -aspirin -ibuprofen -ketoprofen -naproxen This list may not describe all possible interactions. Give your health care provider a list of all the medicines, herbs, non-prescription drugs, or dietary supplements you use. Also tell them if you smoke, drink alcohol, or use illegal drugs. Some items may interact with your medicine. What should I watch for while using this medicine? Visit your doctor for checks on your progress. This drug may make you feel generally unwell. This is not uncommon, as chemotherapy can affect healthy cells as well as cancer cells. Report any side effects. Continue your course of treatment even though you feel ill unless your doctor tells you to stop. In some cases, you may be given additional medicines to help with side effects. Follow all directions for their use. Call your doctor or health care professional for advice if you get a fever, chills or sore throat, or other symptoms of a cold or flu. Do not treat yourself. This drug decreases your body's ability to fight infections. Try to  avoid being around people who are sick. This medicine may increase your risk to bruise or bleed. Call your doctor or health care professional if you notice any unusual bleeding. Be careful brushing  and flossing your teeth or using a toothpick because you may get an infection or bleed more easily. If you have any dental work done, tell your dentist you are receiving this medicine. Avoid taking products that contain aspirin, acetaminophen, ibuprofen, naproxen, or ketoprofen unless instructed by your doctor. These medicines may hide a fever. Women should inform their doctor if they wish to become pregnant or think they might be pregnant. There is a potential for serious side effects to an unborn child. Talk to your health care professional or pharmacist for more information. Do not breast-feed an infant while taking this medicine. What side effects may I notice from receiving this medicine? Side effects that you should report to your doctor or health care professional as soon as possible: -allergic reactions like skin rash, itching or hives, swelling of the face, lips, or tongue -low blood counts - this medicine may decrease the number of white blood cells, red blood cells and platelets. You may be at increased risk for infections and bleeding. -signs of infection - fever or chills, cough, sore throat, pain or difficulty passing urine -signs of decreased platelets or bleeding - bruising, pinpoint red spots on the skin, black, tarry stools, blood in the urine -signs of decreased red blood cells - unusually weak or tired, fainting spells, lightheadedness -breathing problems -chest pain -mouth sores -nausea and vomiting -pain, swelling, redness at site where injected -pain, tingling, numbness in the hands or feet -stomach pain -swelling of ankles, feet, hands -unusual bleeding Side effects that usually do not require medical attention (report to your doctor or health care professional if they continue or are bothersome): -constipation -diarrhea -hair loss -loss of appetite -stomach upset This list may not describe all possible side effects. Call your doctor for medical advice about side  effects. You may report side effects to FDA at 1-800-FDA-1088. Where should I keep my medicine? This drug is given in a hospital or clinic and will not be stored at home. NOTE: This sheet is a summary. It may not cover all possible information. If you have questions about this medicine, talk to your doctor, pharmacist, or health care provider.  2015, [employer]. (2008-04-08 18:45:54)

## 2014-12-24 ENCOUNTER — Telehealth: Payer: Self-pay

## 2014-12-24 LAB — CA 125: CA 125: 189 U/mL — ABNORMAL HIGH (ref <35)

## 2014-12-24 LAB — CANCER ANTIGEN 19-9: CA 19-9: 318.7 U/mL — ABNORMAL HIGH (ref <35.0)

## 2014-12-24 NOTE — Telephone Encounter (Signed)
Left message for Polo Riley to see if she could reach out to Ms. Peppard to see if any assistance/community programs would be available to patient.

## 2014-12-24 NOTE — Telephone Encounter (Signed)
-----   Message from Braulio Bosch, RN sent at 12/23/2014 10:01 AM EST ----- Regarding: Chemo Follow Up First time Gemzar; change in treatment plan.  Dr. Marko Plume.

## 2014-12-24 NOTE — Telephone Encounter (Signed)
No fever, vomiting earlier, able to keep a little water down now. Has not eaten. bm yesterday. Pt is gassy and a little bloated. Did use miralax earlier but threw some up. Took medications, no pills seen in vomitus. Is voiding. Pt has compazine and ativan to last until tomorrow. She has appt with LL at 0830. Told to call if any problems tonight.

## 2014-12-25 ENCOUNTER — Encounter: Payer: Self-pay | Admitting: *Deleted

## 2014-12-25 ENCOUNTER — Ambulatory Visit (HOSPITAL_BASED_OUTPATIENT_CLINIC_OR_DEPARTMENT_OTHER): Payer: Managed Care, Other (non HMO)

## 2014-12-25 ENCOUNTER — Other Ambulatory Visit: Payer: Managed Care, Other (non HMO)

## 2014-12-25 ENCOUNTER — Ambulatory Visit: Payer: Managed Care, Other (non HMO) | Admitting: Nutrition

## 2014-12-25 ENCOUNTER — Encounter: Payer: Self-pay | Admitting: Oncology

## 2014-12-25 ENCOUNTER — Ambulatory Visit (HOSPITAL_BASED_OUTPATIENT_CLINIC_OR_DEPARTMENT_OTHER): Payer: Managed Care, Other (non HMO) | Admitting: Oncology

## 2014-12-25 ENCOUNTER — Telehealth: Payer: Self-pay | Admitting: Oncology

## 2014-12-25 ENCOUNTER — Ambulatory Visit (HOSPITAL_COMMUNITY)
Admission: RE | Admit: 2014-12-25 | Discharge: 2014-12-25 | Disposition: A | Discharge status: Home / Self Care | Payer: Managed Care, Other (non HMO) | Source: Ambulatory Visit | Attending: Oncology | Admitting: Oncology

## 2014-12-25 VITALS — BP 132/75 | HR 124 | Temp 98.3°F | Resp 18 | Ht 62.0 in | Wt 134.0 lb

## 2014-12-25 DIAGNOSIS — M79662 Pain in left lower leg: Secondary | ICD-10-CM | POA: Insufficient documentation

## 2014-12-25 DIAGNOSIS — R110 Nausea: Secondary | ICD-10-CM

## 2014-12-25 DIAGNOSIS — C801 Malignant (primary) neoplasm, unspecified: Secondary | ICD-10-CM

## 2014-12-25 DIAGNOSIS — C252 Malignant neoplasm of tail of pancreas: Secondary | ICD-10-CM

## 2014-12-25 DIAGNOSIS — B3731 Acute candidiasis of vulva and vagina: Secondary | ICD-10-CM

## 2014-12-25 DIAGNOSIS — C561 Malignant neoplasm of right ovary: Secondary | ICD-10-CM | POA: Insufficient documentation

## 2014-12-25 DIAGNOSIS — C259 Malignant neoplasm of pancreas, unspecified: Secondary | ICD-10-CM | POA: Diagnosis present

## 2014-12-25 DIAGNOSIS — C7889 Secondary malignant neoplasm of other digestive organs: Secondary | ICD-10-CM

## 2014-12-25 DIAGNOSIS — R609 Edema, unspecified: Secondary | ICD-10-CM | POA: Diagnosis not present

## 2014-12-25 DIAGNOSIS — R109 Unspecified abdominal pain: Secondary | ICD-10-CM

## 2014-12-25 DIAGNOSIS — B373 Candidiasis of vulva and vagina: Secondary | ICD-10-CM

## 2014-12-25 DIAGNOSIS — C7961 Secondary malignant neoplasm of right ovary: Secondary | ICD-10-CM

## 2014-12-25 DIAGNOSIS — E119 Type 2 diabetes mellitus without complications: Secondary | ICD-10-CM

## 2014-12-25 DIAGNOSIS — D630 Anemia in neoplastic disease: Secondary | ICD-10-CM

## 2014-12-25 LAB — CBC WITH DIFFERENTIAL/PLATELET
BASO%: 0.6 % (ref 0.0–2.0)
BASOS ABS: 0 10*3/uL (ref 0.0–0.1)
EOS%: 0.4 % (ref 0.0–7.0)
Eosinophils Absolute: 0 10*3/uL (ref 0.0–0.5)
HCT: 32.6 % — ABNORMAL LOW (ref 34.8–46.6)
HGB: 10.6 g/dL — ABNORMAL LOW (ref 11.6–15.9)
LYMPH#: 1 10*3/uL (ref 0.9–3.3)
LYMPH%: 14.6 % (ref 14.0–49.7)
MCH: 31.5 pg (ref 25.1–34.0)
MCHC: 32.4 g/dL (ref 31.5–36.0)
MCV: 97 fL (ref 79.5–101.0)
MONO#: 0.1 10*3/uL (ref 0.1–0.9)
MONO%: 1.1 % (ref 0.0–14.0)
NEUT#: 5.9 10*3/uL (ref 1.5–6.5)
NEUT%: 83.3 % — ABNORMAL HIGH (ref 38.4–76.8)
Platelets: 151 10*3/uL (ref 145–400)
RBC: 3.36 10*6/uL — ABNORMAL LOW (ref 3.70–5.45)
RDW: 15 % — AB (ref 11.2–14.5)
WBC: 7.1 10*3/uL (ref 3.9–10.3)

## 2014-12-25 LAB — WHOLE BLOOD GLUCOSE: Glucose: 140 mg/dL — ABNORMAL HIGH (ref 70–100)

## 2014-12-25 MED ORDER — HYDROMORPHONE HCL 4 MG/ML IJ SOLN
2.0000 mg | Freq: Once | INTRAMUSCULAR | Status: AC
  Administered 2014-12-25: 2 mg via INTRAVENOUS

## 2014-12-25 MED ORDER — LORAZEPAM 2 MG/ML IJ SOLN: 0.5000 mg | Freq: Once | INTRAMUSCULAR | Status: DC

## 2014-12-25 MED ORDER — PROMETHAZINE HCL 25 MG/ML IJ SOLN
25.0000 mg | INTRAMUSCULAR | Status: DC | PRN
  Administered 2014-12-25: 25 mg via INTRAVENOUS

## 2014-12-25 MED ORDER — HYDROMORPHONE HCL 4 MG/ML IJ SOLN
INTRAMUSCULAR | Status: AC
  Filled 2014-12-25: qty 1

## 2014-12-25 MED ORDER — OXYMORPHONE HCL ER 30 MG PO TB12: 30.0000 mg | ORAL_TABLET | Freq: Two times a day (BID) | ORAL | Status: DC

## 2014-12-25 MED ORDER — HEPARIN SOD (PORK) LOCK FLUSH 100 UNIT/ML IV SOLN
500.0000 [IU] | Freq: Once | INTRAVENOUS | Status: AC
  Administered 2014-12-25: 500 [IU] via INTRAVENOUS
  Filled 2014-12-25: qty 5

## 2014-12-25 MED ORDER — HYDROMORPHONE HCL 2 MG PO TABS: 2.0000 mg | ORAL_TABLET | ORAL | Status: DC | PRN

## 2014-12-25 MED ORDER — SODIUM CHLORIDE 0.9 % IJ SOLN
10.0000 mL | INTRAMUSCULAR | Status: DC | PRN
  Administered 2014-12-25: 10 mL via INTRAVENOUS
  Filled 2014-12-25: qty 10

## 2014-12-25 MED ORDER — LORAZEPAM 0.5 MG PO TABS: ORAL_TABLET | ORAL | Status: DC

## 2014-12-25 MED ORDER — SODIUM CHLORIDE 0.9 % IV SOLN
INTRAVENOUS | Status: DC
  Administered 2014-12-25: 12:00:00 via INTRAVENOUS

## 2014-12-25 NOTE — Progress Notes (Signed)
1155 Patient complains of generalized abdominal pain that is intermittent, rating the pain a 10 on the 0 10 pain scale. Patient states she has not taken pain medication today. [employer] notified, order given and carried out for Dilaudid 2 mg IV.   25 mg Phenergan administered IV as patient states that phenergan helps her nausea more so than Ativan.  1410 Patient complains of generalized abdominal pain rating the pain a 8 on the 0 to 10 pain scale. Patient states the pain is sharp and crampy. Selena Lesser, NP notified order given and carried out for 2 mg Dilaudid IV. Patient states her nausea has resolved since the phenergan. Patient instructed not to drive. Patient's spouse is driving her home.

## 2014-12-25 NOTE — Patient Instructions (Signed)
Dehydration, Adult Dehydration is when you lose more fluids from the body than you take in. Vital organs like the kidneys, brain, and heart cannot function without a proper amount of fluids and salt. Any loss of fluids from the body can cause dehydration.  CAUSES   Vomiting.  Diarrhea.  Excessive sweating.  Excessive urine output.  Fever. SYMPTOMS  Mild dehydration  Thirst.  Dry lips.  Slightly dry mouth. Moderate dehydration  Very dry mouth.  Sunken eyes.  Skin does not bounce back quickly when lightly pinched and released.  Dark urine and decreased urine production.  Decreased tear production.  Headache. Severe dehydration  Very dry mouth.  Extreme thirst.  Rapid, weak pulse (more than 100 beats per minute at rest).  Cold hands and feet.  Not able to sweat in spite of heat and temperature.  Rapid breathing.  Blue lips.  Confusion and lethargy.  Difficulty being awakened.  Minimal urine production.  No tears. DIAGNOSIS  Your caregiver will diagnose dehydration based on your symptoms and your exam. Blood and urine tests will help confirm the diagnosis. The diagnostic evaluation should also identify the cause of dehydration. TREATMENT  Treatment of mild or moderate dehydration can often be done at home by increasing the amount of fluids that you drink. It is best to drink small amounts of fluid more often. Drinking too much at one time can make vomiting worse. Refer to the home care instructions below. Severe dehydration needs to be treated at the hospital where you will probably be given intravenous (IV) fluids that contain water and electrolytes. HOME CARE INSTRUCTIONS   Ask your caregiver about specific rehydration instructions.  Drink enough fluids to keep your urine clear or pale yellow.  Drink small amounts frequently if you have nausea and vomiting.  Eat as you normally do.  Avoid:  Foods or drinks high in sugar.  Carbonated  drinks.  Juice.  Extremely hot or cold fluids.  Drinks with caffeine.  Fatty, greasy foods.  Alcohol.  Tobacco.  Overeating.  Gelatin desserts.  Wash your hands well to avoid spreading bacteria and viruses.  Only take over-the-counter or prescription medicines for pain, discomfort, or fever as directed by your caregiver.  Ask your caregiver if you should continue all prescribed and over-the-counter medicines.  Keep all follow-up appointments with your caregiver. SEEK MEDICAL CARE IF:  You have abdominal pain and it increases or stays in one area (localizes).  You have a rash, stiff neck, or severe headache.  You are irritable, sleepy, or difficult to awaken.  You are weak, dizzy, or extremely thirsty. SEEK IMMEDIATE MEDICAL CARE IF:   You are unable to keep fluids down or you get worse despite treatment.  You have frequent episodes of vomiting or diarrhea.  You have blood or green matter (bile) in your vomit.  You have blood in your stool or your stool looks black and tarry.  You have not urinated in 6 to 8 hours, or you have only urinated a small amount of very dark urine.  You have a fever.  You faint. MAKE SURE YOU:   Understand these instructions.  Will watch your condition.  Will get help right away if you are not doing well or get worse. Document Released: 11/28/2005 Document Revised: 02/20/2012 Document Reviewed: 07/18/2011 ExitCare Patient Information 2015 [employer]. This information is not intended to replace advice given to you by your health care provider. Make sure you discuss any questions you have with your health care   provider.  

## 2014-12-25 NOTE — Progress Notes (Signed)
[place] Work  Clinical Social Work was referred by Medical sales representative for assessment of psychosocial needs.  Clinical Social Worker met with patient at [facility] while she was getting fluids to offer support and assess for needs.  Pt and husband needed guidance on how to obtain will. CSW had helped them complete ADRs and HCPOA paperwork. CSW explained that CSW cannot complete actual will nor notarize, however template forms are available online for a free. Pt and husband are aware of how to obtain and complete. They report to have access to a notary. CSW also discussed how to access financial counselors at [facility], discussed [place] as a Emergency planning/management officer and provided info/handout on the [employer]. Pt and husband stated understanding and denied other concerns currently.   Clinical Social Work interventions:  Resource education  Loren Racer, [place] Worker [place]  [place] Phone: [phone] Fax: [phone]

## 2014-12-25 NOTE — Progress Notes (Signed)
Received request to provide patient with oral nutrition supplements. Patient receiving IVF. Provided samples of juice-based supplements. Patient had tried [employer] in the hospital but did not like it. She has not tried Ensure Clear. Educated patient on ways to mix supplements to reduce sweetness and aftertaste.   Patient open to trying [facility] again. Provided purchasing information.  **Disclaimer: This note was dictated with voice recognition software. Similar sounding words can inadvertently be transcribed and this note may contain transcription errors which may not have been corrected upon publication of note.**

## 2014-12-25 NOTE — Progress Notes (Signed)
Bilateral lower extremity venous duplex completed:  No evidence of DVT, superficial thrombosis, or Baker's cyst.   

## 2014-12-25 NOTE — Telephone Encounter (Signed)
, °

## 2014-12-25 NOTE — Progress Notes (Signed)
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 CT AP 11-24-14 showed good partial response, as did markers thru 10-2014, however she has had increasing abdominal symptoms in last few weeks. She was seen at ED 12-15-14 for abdominal pain, with CT AP showing slight increase in ascites and increased omental involvement around small bowel in mid and lower abdomen, with some distension of small bowel loops but no full obstruction, and some constipation. She saw Dr Denman George on 12-16-14, with recommendation for change in therapy to gemzar. Patient was anxious to begin gemzar, scheduling such that cycle 1 day 1 was given 12-23-13, after teaching and consent.  Patient has had no acute problems that seem related to first gemzar, including no fever or flu-like symptoms. She has intermittent nausea and vomiting, which does not seem worse since gemzar; she vomited yesterday x1 and this AM, is out of ativan. She has kept down little po, tho tries to drink miralax in chocoloate milk. Last BM was 2 days ago (note constipation with abdominal pain in ED, plan then miralax 1-2x daily with colace and prn mag citrate.  She continues bid oxymorphone and used dilaudid 4 mg last pm.  She cancelled appointment with urologist, which I had set up as some symptoms suggested recurrent cystitis. She does not feel this is the problem.Urine culture sent from ED on 12-15-14 multiple species. New problem today is pain behind left knee, new onset today without trauma, no noted swelling, worse when walking.  She has not had similar problem previously. Nothing has alleviated thus far.     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 Husband requests assistance with regular will, [facility] SW to speak with them.  Advance Directives were completed at [facility] previously; she does not want resuscitation or life support, which we will document in EMR now.  Husband tells me that he was not aware until now that situation is not considered curable, tho patient has understood this. Patient tells [place] that daughter is also aware, but "hopes for a miracle".   PAC in Anaphylaxis to flu vaccine previously DNR patient's request  ONCOLOGIC HISTORY Patient has history of cystitis 2 years ago which presented with suprapubic and back pain, resolved with macrobid x 3 months; CT then was not remarkable. She developed similar symptoms over several months recently and was seen by Dr Nila Nephew with finding of urinary retention. CT AP without contrast at [facility] 06-09-14 showed 9.5 x 14.8 x 10.5 cm complex cystic mass in central pelvis with uterus surgically absent, no adenopathy abdomen or pelvic sidewall, apparent thickening tail of pancreas and question of peripancreatic stranding, 2.5 x 1.8 cm low density structure adjacent to right atrium. She was seen by Dr Ellouise Newer on 06-11-14, with pelvic mass on exam, CA 125 152 and CEA 3.28 both on 06-11-14. She was seen by Dr Denman George on 06-16-14, at which time she reported 10 lb weight loss in 2 weeks, early satiety, increased constipation and pain; her exam revealed large smooth pelvic mass filling pelvis, adherent to vagina and in close approximation to rectum, minimally mobile. CA 19-9 from 06-16-14 was  224.7. 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 She had exploratory laparotomy with right salpingo-oophorectomy, omentectomy and radical debulking of ovarian cancer by Dr Denman George at [facility] on 07-01-14. Intraoperative findings were of 20 cm cystic mass arising from right ovary, filling pelvis centrally and adherent to sigmoid colon, 200 cc ascites, peritoneal tumor plaques anterior pelvis and right diaphragm with TNTC 1 cm nodules small bowel mesentery and bowel wall; enlarged pancreatic body was described as rigid and abnormally firm to palpation. At completion of surgery there was residual tumor at the mesentery of the small and large intestine, thin tumor plaques on the diaphragm and pelvic peritoneum, and gallbladder serosa and pouch of Randol Kern representing an incomplete and suboptimal cytoreduction.  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 This pathology was reviewed by 3 pathologists in [facility] system. Patient was stable for DC home on 07-03-14. Within 24 hours after discharge she developed nausea, vomiting and abdominal pain, requiring readmission from 7-25 thru 07-15-14 for SBO and mild pancreatitis. She was managed conservatively in hospital, followed by Dr Denman George, did require NG decompression and support with TNA via PICC. Repeat CT AP was negative for high grade bowel obstruction or transition point; PICC was removed prior to DC home and diabetes was managed with SS insulin. Lovenox was continued in hospital but was not resumed at DC (initially planned x 4 weeks post op). She had endoscopic [place] with biopsy of mass in tail of pancreas by Dr Paulita Fujita on 07-30-14, path confirming mucinous cystadenocarcinoma involvement there. She began dose dense  carboplatin taxol on 08-08-14, with improvement in symptoms and in CA 19-9 and CA 125 . By 10-31-14 CA 19-9 was down to 19.8 and CA 125 down to 33. CT CAP 11-24-14 showed decrease in lesion adjacent to right atrium, stranding at pancreatic tail region, question of bladder wall thickening, no ureteral obstruction, no AP adenopathy, small scattered ascites, some constipation. Markers began increasing in 11-2014, with more abdominal pain and CT AP 12-15-14 with evidence of progressive disease. She began gemzar 12-23-14.     Review of systems as above, also: No SOB. No bleeding tho stools have been dark since she has been on iron, no hematuria. No problems with PAC.She does not mention bladder today. No LE swelling. A little light headed on standing at times. Remainder of 10 point Review of Systems negative.  Objective:  Vital signs in last 24 hours:  BP 132/75 mmHg  Pulse 124  Temp(Src) 98.3 F (36.8 C) (Oral)  Resp 18  Ht 5\' 2"  (1.575 m)  Wt 134 lb (60.782 kg)  BMI 24.50 kg/m2 Weight down 1 lb from 12-16-14. Looks uncomfortable but not in acute distress, respirations not labored RA. Alert, oriented and appropriate. Ambulatory with some assistance due to left knee pain.  Somewhat pale, not icteric.  HEENT:PERRL, sclerae not icteric. Oral mucosa moist without lesions, posterior pharynx clear.  Neck supple. No JVD.  Lymphatics:no cervical,supraclavicular or inguinal adenopathy Resp: clear to auscultation bilaterally and normal percussion bilaterally Cardio: regular rate and rhythm, tachy. No gallop. GI: soft, not tender to  gentle exam, not distended, no mass or organomegaly. Diminished but otherwise normal bowel sounds in all quadrants. Surgical incision not remarkable. Musculoskeletal/ Extremities: without pitting edema, cords. Tender behind left knee. No varicosities noted. Neuro: no change peripheral neuropathy. Otherwise nonfocal. PSYCH appropriately upset given situation Skin without rash,  ecchymosis, petechiae Portacath-without erythema or tenderness  Lab Results: CBC 12-23-13 with WBC 4.3, hgb 9.8, plt 126 Repeat CBC today in follow up of drop in hemoglobin resulted with WBC 7.1, Hgb 10.6, plt 151 CMET 12-23-14 reviewed. Blood sugar today 140 CA 19-9 from 12-23-14 up to 318 from 66 on 12-21 and 19.8 in Nov CA 125 from 12-23-14 189, up from 79 on 12-21 and 33 in Nov   Studies/Results: CT ABDOMEN AND PELVIS WITH CONTRAST  TECHNIQUE: Multidetector CT imaging of the abdomen and pelvis was performed using the standard protocol following bolus administration of intravenous contrast.  CONTRAST: 129mL OMNIPAQUE IOHEXOL 300 MG/ML SOLN, 20mL OMNIPAQUE IOHEXOL 300 MG/ML SOLN  COMPARISON: CT of the abdomen and pelvis performed 07/11/2014, and CT of the abdomen performed 11/24/2014  FINDINGS: A trace left pleural effusion is noted. A tiny hiatal hernia is seen. 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 A 2.8 cm cystic structure adjacent to the right atrium again may reflect a pericardial cyst or foregut duplication cyst.  Small volume ascites is again noted surrounding the liver and spleen, with trace fluid tracking into the pelvis. This is worsened from the prior study, though similar in appearance to 07/11/2014.  The liver and spleen are unremarkable in appearance. Mildly increased attenuation within the gallbladder is stable from prior studies and may reflect remote vicarious excretion of contrast. The pancreas is stable in appearance; prior enlargement of the distal pancreatic body has resolved and may have reflected pancreatitis, as previously noted. The adrenal glands are grossly unremarkable in appearance.  There is worsened clumping of the small bowel at the mid and lower abdomen, extending into the pelvis, with vague soft tissue density surrounding the small bowel, raising concern for  recurrent omental metastases. This is similar to the appearance on 07/11/2014, though perhaps slightly worsened, and appears worsened from the recent prior study performed 11/24/2014. There is mild associated clumping of the adjacent sigmoid colon, more prominent than on prior studies.  There is mild distention of small-bowel loops to 3.7 cm in maximal diameter, without definite evidence of distal obstruction. This may reflect some degree of small bowel dysmotility due to the clumping seen at the lower abdomen and pelvis.  Minimal right-sided hydronephrosis is noted, with mild left-sided pelvicaliectasis. This may be transient in nature, as there is no evidence of distal obstruction. The kidneys are otherwise unremarkable. No perinephric stranding is seen. No renal or ureteral stones are identified.  There is perhaps mildly increased stranding within the omental fat.  The stomach is otherwise within normal limits. No acute vascular abnormalities are seen.  The patient is status post appendectomy. The colon is partially filled with stool. As described above, there is slightly increased clumping of the colon at the mid sigmoid colon, with question of associated wall thickening.  The bladder is mildly distended and grossly unremarkable. The patient is status post hysterectomy. No inguinal lymphadenopathy is seen.  No acute osseous abnormalities are identified.  IMPRESSION: 1. Worsened clumping of the small bowel at the mid and lower abdomen, extending into the pelvis, with mildly worsened clumping of the adjacent sigmoid colon and mild apparent wall thickening.  Given the patient's history of omental metastases, this may reflect recurrent omental metastasis, difficult to differentiate from adjacent small bowel. Mildly increased stranding suggested within the omental fat. No definite dominant masses identified. 2. Mild distention of small-bowel loops to 3.7 cm in  maximal diameter, without definite evidence of distal obstruction. This may reflect some degree of associated small bowel dysmotility. 3. Small volume ascites within the abdomen and pelvis, worsened from the prior study, though similar in appearance to 07/11/2014. 4. Minimal right-sided hydronephrosis; this may be transient in nature, as there is no definite evidence of distal obstruction. 5. Trace left pleural effusion noted. 6. Prominent gastric and splenic varices, with underlying chronic splenic vein occlusion. 7. Stable appearance to the pancreas; prior enlargement of the distal pancreatic body has resolved and may have reflected pancreatitis, as previously noted. 8. Tiny hiatal hernia noted. 9. 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 Needs to take miralax as recommended. She will be given IV NS with IV dilaudid and antiemetics today.   May need to try Reglan as next step, not discussed today/ no script yet.  DISCUSSION: very clear discussion with husband today, as apparently significant denial to this point. She will be eligible for hospice when she is no longer receiving chemotherapy. She does want to try gemzar, which will be given every other week as long as performance status is adequate for treatment. I have told her that she can stop this at any time if she prefers not to take it, and that I will not continue to treat with chemo if performance status is not adequate for chemo or if no reasonable options. 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 Dose dense carbo taxol x 4 cycles + day 1  cycle 5 from 8-28 thru 10-31-14, with neupogen used at home. Good partial response by scans, peripheral neuropathy contraindicates further taxol. Markers increased during 4 week chemo break and subsequently, repeat CT 12-15-14 with progression. Began gemzar 12-23-14, planned weekly x3 every 4 weeks if tolerated. I will see her in 7-10 days. 2. Diabetes: on oral agent prior to this diagnosis. She does not like to check blood sugars at home 3.SBO postoperatively: resolved with conservative care. Constipation due to narcotics and chemo may be contributing to abdominal discomfort, continue laxatives. 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 Significant progression in feet by last taxol. Elavil at hs prescribed in Dec 5..post remote hysterectomy and left oophorectomy  6.HTN and left BBB, known to cardiology in [place]  7.new left knee pain: remote bilateral knee surgeries per my H&P. No DVT by doppler after MD visit now 8.inadequate po intake today: IVF following MD visit. [place] dietician to see today - thank you!  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 Patient cancelled Jan apt with Dr Nila Nephew.Urine culture 12-15-14 negative 11.Advance directives done 12. PAC in 13.social concerns, disability application pending. [facility] SW to see today - thank you! 14.anemia: hemoglobin better today than day of chemo. Check hemoccult cards, follow.  TIme spent 40+ min including >50% counseling and coordination of  care.    Gordy Levan, MD   12/25/2014, 8:39 AM

## 2014-12-25 NOTE — Progress Notes (Signed)
RECEIVED A FAX FROM [facility] CONCERNING A PRIOR AUTHORIZATION FOR OPANA ER. THIS REQUEST WAS PLACED IN THE MANAGED CARE BIN.

## 2014-12-26 ENCOUNTER — Encounter: Payer: Self-pay | Admitting: Oncology

## 2014-12-26 NOTE — Progress Notes (Signed)
[employer] approved opana er from 12/25/14-12/26/15

## 2014-12-29 ENCOUNTER — Telehealth: Payer: Self-pay | Admitting: *Deleted

## 2014-12-29 NOTE — Telephone Encounter (Signed)
Per staff message and POF I have scheduled appts. Advised scheduler of appts. Per desk RN appt to be scheduled 1/25     JMW

## 2014-12-30 ENCOUNTER — Telehealth: Payer: Self-pay | Admitting: *Deleted

## 2014-12-30 ENCOUNTER — Telehealth: Payer: Self-pay | Admitting: Oncology

## 2014-12-30 ENCOUNTER — Other Ambulatory Visit: Payer: Self-pay | Admitting: Oncology

## 2014-12-30 DIAGNOSIS — C259 Malignant neoplasm of pancreas, unspecified: Secondary | ICD-10-CM

## 2014-12-30 DIAGNOSIS — C561 Malignant neoplasm of right ovary: Secondary | ICD-10-CM

## 2014-12-30 NOTE — Telephone Encounter (Signed)
Called patient as noted below by Dr. Marko Plume. Patient states she had a large BM today. The last time she threw up was yesterday but has hardly had any nausea today. She states she has eaten today and the food has stayed down without any problems.  Reglan not called in since symptoms are better.  Patient states that her pharmacy called and said Neupogen injections were ready to pickup. Called her pharmacy and spoke with Larkin Ina, pharmacist, and he states the injections were authorized. Patient inquiring if she should go ahead and do an injection. Information passed along to Dr. Marko Plume to review.

## 2014-12-30 NOTE — Telephone Encounter (Signed)
-----   Message from Gordy Levan, MD sent at 12/27/2014  9:32 AM EST ----- If bowels not moving well/ still nausea and intermittent vomiting next week, would try reglan 10 mg ac hs , enough for 1-2 weeks to start  thanks

## 2014-12-31 ENCOUNTER — Encounter: Payer: Self-pay | Admitting: Oncology

## 2014-12-31 MED ORDER — METOCLOPRAMIDE HCL 10 MG PO TABS: 10.0000 mg | ORAL_TABLET | Freq: Three times a day (TID) | ORAL | Status: DC

## 2014-12-31 NOTE — Progress Notes (Signed)
[employer] approved neupogen from 12/25/14-06/25/15 ref # [id number]

## 2014-12-31 NOTE — Telephone Encounter (Signed)
Called and told Kristin Meadows that Dr. Marko Plume wants to wait and see her labs on 01-05-15 to determine if she needs neupogen asnoted below by Dr. Marko Plume. Kristin Meadows stated that she feels today as though she needs the Reglan as she discussed with Kristen yesterday.  Will sent it to her pharmacy.  Patient verbalized understanding.

## 2014-12-31 NOTE — Telephone Encounter (Signed)
Patient Demographics     Patient Name Sex DOB SSN Address Phone    Kristin Meadows, Kristin Meadows Female February 10, 1956 [id number]  [place] [address] [phone] (Home) [phone] (Mobile) *Preferred*      RE: Neupogen injections  Received: Norman Herrlich, MD  Christa See, RN; Baruch Merl, RN           May not need neupogen. Will wait to see counts on 1-25 and decide then.  thanks       Previous Messages     ----- Message -----   From: Christa See, RN   Sent: 12/30/2014  5:06 PM    To: Baruch Merl, RN, Gordy Levan, MD  Subject: Neupogen injections                Hey Dr. Marko Plume,   Spoke with patient & her pharmacy today - Neupogen shots were approved and her husband just picked them up tonight.   Patient is asking if she needs to go ahead and do an injection since her last chemo was 12/23/14 (gemzar)? I couldn't tell by your last note if you wanted her to go ahead and do an injection or not - for that reason I told her to hold off for tonight and that Barbaraann Share will call her in the morning and let her know once you advise.   Thanks so much! Cyril Mourning

## 2014-12-31 NOTE — Addendum Note (Signed)
Addended by: Baruch Merl on: 12/31/2014 11:04 AM   Modules accepted: Orders

## 2015-01-01 ENCOUNTER — Encounter: Payer: Self-pay | Admitting: *Deleted

## 2015-01-04 ENCOUNTER — Other Ambulatory Visit: Payer: Self-pay | Admitting: Oncology

## 2015-01-05 ENCOUNTER — Telehealth: Payer: Self-pay | Admitting: Oncology

## 2015-01-05 ENCOUNTER — Ambulatory Visit: Payer: Managed Care, Other (non HMO)

## 2015-01-05 ENCOUNTER — Other Ambulatory Visit: Payer: Self-pay

## 2015-01-05 ENCOUNTER — Encounter: Payer: Self-pay | Admitting: Oncology

## 2015-01-05 ENCOUNTER — Ambulatory Visit (HOSPITAL_BASED_OUTPATIENT_CLINIC_OR_DEPARTMENT_OTHER): Payer: Managed Care, Other (non HMO) | Admitting: Oncology

## 2015-01-05 ENCOUNTER — Other Ambulatory Visit (HOSPITAL_BASED_OUTPATIENT_CLINIC_OR_DEPARTMENT_OTHER): Payer: Managed Care, Other (non HMO)

## 2015-01-05 ENCOUNTER — Telehealth: Payer: Self-pay | Admitting: *Deleted

## 2015-01-05 VITALS — BP 121/58 | HR 112 | Temp 98.5°F | Resp 17 | Ht 62.0 in | Wt 134.8 lb

## 2015-01-05 DIAGNOSIS — B373 Candidiasis of vulva and vagina: Secondary | ICD-10-CM

## 2015-01-05 DIAGNOSIS — B3731 Acute candidiasis of vulva and vagina: Secondary | ICD-10-CM

## 2015-01-05 DIAGNOSIS — K5909 Other constipation: Secondary | ICD-10-CM

## 2015-01-05 DIAGNOSIS — K5903 Drug induced constipation: Secondary | ICD-10-CM

## 2015-01-05 DIAGNOSIS — Z95828 Presence of other vascular implants and grafts: Secondary | ICD-10-CM

## 2015-01-05 DIAGNOSIS — C561 Malignant neoplasm of right ovary: Secondary | ICD-10-CM

## 2015-01-05 DIAGNOSIS — C252 Malignant neoplasm of tail of pancreas: Secondary | ICD-10-CM

## 2015-01-05 DIAGNOSIS — G620 Drug-induced polyneuropathy: Secondary | ICD-10-CM

## 2015-01-05 DIAGNOSIS — E119 Type 2 diabetes mellitus without complications: Secondary | ICD-10-CM

## 2015-01-05 DIAGNOSIS — T451X5A Adverse effect of antineoplastic and immunosuppressive drugs, initial encounter: Secondary | ICD-10-CM

## 2015-01-05 DIAGNOSIS — T402X5A Adverse effect of other opioids, initial encounter: Secondary | ICD-10-CM

## 2015-01-05 DIAGNOSIS — D630 Anemia in neoplastic disease: Secondary | ICD-10-CM

## 2015-01-05 LAB — CBC WITH DIFFERENTIAL/PLATELET
BASO%: 0.7 % (ref 0.0–2.0)
BASOS ABS: 0 10*3/uL (ref 0.0–0.1)
EOS%: 1.4 % (ref 0.0–7.0)
Eosinophils Absolute: 0.1 10*3/uL (ref 0.0–0.5)
HEMATOCRIT: 33.2 % — AB (ref 34.8–46.6)
HEMOGLOBIN: 10.5 g/dL — AB (ref 11.6–15.9)
LYMPH%: 27.7 % (ref 14.0–49.7)
MCH: 30.7 pg (ref 25.1–34.0)
MCHC: 31.5 g/dL (ref 31.5–36.0)
MCV: 97.3 fL (ref 79.5–101.0)
MONO#: 0.5 10*3/uL (ref 0.1–0.9)
MONO%: 8.1 % (ref 0.0–14.0)
NEUT#: 3.5 10*3/uL (ref 1.5–6.5)
NEUT%: 62.1 % (ref 38.4–76.8)
Platelets: 83 10*3/uL — ABNORMAL LOW (ref 145–400)
RBC: 3.41 10*6/uL — AB (ref 3.70–5.45)
RDW: 15 % — ABNORMAL HIGH (ref 11.2–14.5)
WBC: 5.6 10*3/uL (ref 3.9–10.3)
lymph#: 1.5 10*3/uL (ref 0.9–3.3)

## 2015-01-05 LAB — COMPREHENSIVE METABOLIC PANEL (CC13)
ALK PHOS: 150 U/L (ref 40–150)
ALT: 41 U/L (ref 0–55)
AST: 52 U/L — ABNORMAL HIGH (ref 5–34)
Albumin: 3.4 g/dL — ABNORMAL LOW (ref 3.5–5.0)
Anion Gap: 11 mEq/L (ref 3–11)
BUN: 9.7 mg/dL (ref 7.0–26.0)
CO2: 28 meq/L (ref 22–29)
CREATININE: 0.6 mg/dL (ref 0.6–1.1)
Calcium: 9.3 mg/dL (ref 8.4–10.4)
Chloride: 98 mEq/L (ref 98–109)
GLUCOSE: 123 mg/dL (ref 70–140)
POTASSIUM: 3.6 meq/L (ref 3.5–5.1)
SODIUM: 136 meq/L (ref 136–145)
TOTAL PROTEIN: 6.1 g/dL — AB (ref 6.4–8.3)
Total Bilirubin: 0.51 mg/dL (ref 0.20–1.20)

## 2015-01-05 LAB — FECAL OCCULT BLOOD, GUAIAC: Occult Blood: NEGATIVE

## 2015-01-05 MED ORDER — LORAZEPAM 0.5 MG PO TABS: ORAL_TABLET | ORAL | Status: DC

## 2015-01-05 NOTE — Telephone Encounter (Signed)
, °

## 2015-01-05 NOTE — Telephone Encounter (Signed)
Per staff message and POF I have scheduled appts. Advised scheduler of appts. JMW  

## 2015-01-05 NOTE — Progress Notes (Signed)
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 She had first gemzar at 800 mg/m2 on 12-23-14, however planned second cycle must be held today with platelets 83k.   Patient felt better after IVF on 12-25-14. [place] nutritionist and SW met with her also that day; she likes Breeze supplements. Abdomen felt better after good BM on 1-19; nausea and bowel movements have been better since addition of reglan QID starting 12-31-14. She has had no bleeding or bruising. She cannot tell that elavil has helped neuropathy in feet so will stop this at hs; she understands that elavil might also help with sleep and with depression, so will let us know if those problems worsen off of elavil. Left knee pain resolved after ~ 2 days. PO intake some better last few days. Pain controlled with same oxymorphone and dilaudid total 4 mg bid.   PAC in Anaphylaxis to flu vaccine previously DNR patient's request  ONCOLOGIC HISTORY Patient has history of cystitis 2 years ago which presented with suprapubic and back pain, resolved with macrobid x 3 months; CT then was not remarkable. She developed similar symptoms over several months recently and was seen by Dr Nila Nephew with finding of urinary retention. CT AP without contrast at [facility] 06-09-14 showed 9.5 x 14.8 x 10.5 cm complex cystic mass in central pelvis with uterus surgically absent, no adenopathy abdomen or pelvic sidewall, apparent thickening tail of pancreas and question of peripancreatic stranding, 2.5 x 1.8 cm low density structure adjacent to right atrium.  She was seen by Dr Ellouise Newer on 06-11-14, with pelvic mass on exam, CA 125 152 and CEA 3.28 both on 06-11-14. She was seen by Dr Denman George on 06-16-14, at which time she reported 10 lb weight loss in 2 weeks, early satiety, increased constipation and pain; her exam revealed large smooth pelvic mass filling pelvis, adherent to vagina and in close approximation to rectum, minimally mobile. CA 19-9 from 06-16-14 was 224.7. 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 She had exploratory laparotomy with right salpingo-oophorectomy, omentectomy and radical debulking of ovarian cancer by Dr Denman George at [facility] on 07-01-14. Intraoperative findings were of 20 cm cystic mass arising from right ovary, filling pelvis centrally and adherent to sigmoid colon, 200 cc ascites, peritoneal tumor plaques anterior pelvis and right diaphragm with TNTC 1 cm nodules small bowel mesentery and bowel wall; enlarged pancreatic body was described as rigid and abnormally firm to palpation. At completion of surgery there was residual tumor at the mesentery of the small and large intestine, thin tumor plaques on the diaphragm and pelvic peritoneum, and gallbladder serosa and pouch of Randol Kern representing an incomplete and suboptimal cytoreduction.  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 This pathology was reviewed by 3 pathologists in [facility] system.  Patient was stable for DC home on 07-03-14. Within 24 hours after discharge she developed nausea,  vomiting and abdominal pain, requiring readmission from 7-25 thru 07-15-14 for SBO and mild pancreatitis. She was managed conservatively in hospital, followed by Dr Denman George, did require NG decompression and support with TNA via PICC. Repeat CT AP was negative for high grade bowel obstruction or transition point; PICC was removed prior to DC home and diabetes was managed with SS insulin. Lovenox was continued in hospital but was not resumed at DC (initially planned x 4 weeks post op). She had endoscopic [place] with biopsy of mass in tail of pancreas by Dr Paulita Fujita on 07-30-14, path confirming mucinous cystadenocarcinoma involvement there. She began dose dense carboplatin taxol on 08-08-14, with improvement in symptoms and in CA 19-9 and CA 125 . By 10-31-14 CA 19-9 was down to 19.8 and CA 125 down to 33. CT CAP 11-24-14 showed decrease in lesion adjacent to right atrium, stranding at pancreatic tail region, question of bladder wall thickening, no ureteral obstruction, no AP adenopathy, small scattered ascites, some constipation. Markers began increasing in 11-2014, with more abdominal pain and CT AP 12-15-14 with evidence of progressive disease. She began gemzar 12-23-14.    Review of systems as above, also: No fever or symptoms of infection. Not complaining of bladder symptoms now. Energy a little better. No increased SOB. Remainder of 10 point Review of Systems negative.  Objective:  Vital signs in last 24 hours:  BP 121/58 mmHg  Pulse 112  Temp(Src) 98.5 F (36.9 C) (Oral)  Resp 17  Ht 5' 2"  (1.575 m)  Wt 134 lb 12.8 oz (61.145 kg)  BMI 24.65 kg/m2  SpO2 95%  Weight up almost a pound. Alert, oriented and appropriate. Ambulatory without assistance. Looks more comfortable today, drinking liquids during visit. Respirations not labored RA. Pale, not icteric Alopecia  HEENT:PERRL, sclerae not icteric. Oral mucosa moist without lesions, posterior pharynx clear.  Neck supple. No JVD.  Lymphatics:no  cervical,supraclavicular adenopathy Resp: clear to auscultation bilaterally and normal percussion bilaterally Cardio: regular rate and rhythm. No gallop. GI: soft, nontender, not distended, no mass or organomegaly. some bowel sounds. Surgical incision not remarkable. Musculoskeletal/ Extremities: without pitting edema, cords, tenderness Neuro: no change peripheral neuropathy. Otherwise nonfocal. PSYCH: mood and affect improved Skin without rash, ecchymosis, petechiae Portacath-without erythema or tenderness  Lab Results:  Results for orders placed or performed in visit on 01/05/15  CBC with Differential  Result Value Ref Range   WBC 5.6 3.9 - 10.3 10e3/uL   NEUT# 3.5 1.5 - 6.5 10e3/uL   HGB 10.5 (L) 11.6 - 15.9 g/dL   HCT 33.2 (L) 34.8 - 46.6 %   Platelets 83 (L) 145 - 400 10e3/uL   MCV 97.3 79.5 - 101.0 fL   MCH 30.7 25.1 - 34.0 pg   MCHC 31.5 31.5 - 36.0 g/dL   RBC 3.41 (L) 3.70 - 5.45 10e6/uL   RDW 15.0 (H) 11.2 - 14.5 %   lymph# 1.5 0.9 - 3.3 10e3/uL   MONO# 0.5 0.1 - 0.9 10e3/uL   Eosinophils Absolute 0.1 0.0 - 0.5 10e3/uL   Basophils Absolute 0.0 0.0 - 0.1 10e3/uL   NEUT% 62.1 38.4 - 76.8 %   LYMPH% 27.7 14.0 - 49.7 %   MONO% 8.1 0.0 - 14.0 %   EOS% 1.4 0.0 - 7.0 %   BASO% 0.7 0.0 - 2.0 %  Fecal Occult Blood  Result Value Ref Range   Occult Blood Negative x3  CMET available after visit normal with exception of AST 52, Tprot 6.1 and alb 3.4  CA 19-9 and CA 125 done 12-23-14 as baseline for gemzar were 318 and 189 respectively  Studies/Results:  No results found.  Medications: I have reviewed the patient's current medications. She will try off elavil, tho she has one refill left on this if needed. She has 33 dilaudid left, using ~ 4 daily. She does not need neupogen at home after gemzar thus far, but has this available if so. Continue Reglan ac/hs.  She is not taking ASA now. 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 With new insurance beginning Feb 1, she does not want to change providers immediately.  Discussed low platelets and need to hold #2 gemzar today. She understands that platelets should increase without intervention, hopefully by next week. She will call if significant bleeding. I have decreased gemzar dose to 600 mg/m2 beginning with next treatment.  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 Dose dense carbo taxol x 4 cycles + day 1 cycle 5 from 8-28 thru 10-31-14, with neupogen used at home. Good partial response by scans, peripheral neuropathy contraindicates further taxol. Markers increased during 4 week chemo break and subsequently, repeat CT 12-15-14 with progression. Began gemzar 12-23-14, platelets not adequate for #2 today. Will recheck counts and treat at lower dose of 600 mg/m2 next week if possible, then ~ every other week.  Referral to medical oncology at [facility] can be done if she prefers. 2. Diabetes: on oral agent prior to this diagnosis, discontinued in last week by PCP due to good A1c. 3.SBO postoperatively: resolved with conservative care. Constipation due to narcotics and chemo may be contributing to abdominal discomfort, continue laxatives. 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 Significant progression in feet by last taxol. Elavil at hs prescribed in Dec 5..post remote hysterectomy and left oophorectomy  6.HTN and left BBB, known to cardiology in [place]  7.new left knee pain: remote bilateral knee surgeries per my H&P. No DVT by doppler after MD visit now 8.inadequate po intake today: IVF following MD visit. [place] dietician to see today - thank  you!  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 Patient cancelled Jan apt with Dr Nila Nephew.Urine culture 12-15-14 negative 11.Advance directives done. Patient requests DNR status, but does want interventions short of that that might help 12. PAC in 13.social concerns, disability application, changes in insurance coverage 14.anemia: Hemoccult cards negative x 3 today. Continue oral iron   All questions answered. She knows to call prior to next scheduled visit if needed. TIme spent 25 min including >50% counseling and coordination of care. Cc Drs Seward Meth and Bartholomew Boards, MD   01/05/2015, 11:54 AM

## 2015-01-08 ENCOUNTER — Encounter: Payer: Self-pay | Admitting: Oncology

## 2015-01-08 ENCOUNTER — Other Ambulatory Visit: Payer: Managed Care, Other (non HMO)

## 2015-01-08 ENCOUNTER — Ambulatory Visit: Payer: Managed Care, Other (non HMO) | Admitting: Oncology

## 2015-01-08 NOTE — Progress Notes (Signed)
Mailed disability form in self-addressed envelope to [employer] @ [place]

## 2015-01-15 ENCOUNTER — Other Ambulatory Visit: Payer: Self-pay | Admitting: Oncology

## 2015-01-15 ENCOUNTER — Other Ambulatory Visit (HOSPITAL_BASED_OUTPATIENT_CLINIC_OR_DEPARTMENT_OTHER): Payer: 59

## 2015-01-15 ENCOUNTER — Ambulatory Visit (HOSPITAL_BASED_OUTPATIENT_CLINIC_OR_DEPARTMENT_OTHER): Payer: 59

## 2015-01-15 VITALS — BP 117/68 | HR 110 | Temp 98.6°F

## 2015-01-15 DIAGNOSIS — C7889 Secondary malignant neoplasm of other digestive organs: Secondary | ICD-10-CM

## 2015-01-15 DIAGNOSIS — C252 Malignant neoplasm of tail of pancreas: Secondary | ICD-10-CM

## 2015-01-15 DIAGNOSIS — C801 Malignant (primary) neoplasm, unspecified: Secondary | ICD-10-CM

## 2015-01-15 DIAGNOSIS — B3731 Acute candidiasis of vulva and vagina: Secondary | ICD-10-CM

## 2015-01-15 DIAGNOSIS — B373 Candidiasis of vulva and vagina: Secondary | ICD-10-CM

## 2015-01-15 DIAGNOSIS — Z5111 Encounter for antineoplastic chemotherapy: Secondary | ICD-10-CM

## 2015-01-15 DIAGNOSIS — C259 Malignant neoplasm of pancreas, unspecified: Secondary | ICD-10-CM

## 2015-01-15 DIAGNOSIS — C561 Malignant neoplasm of right ovary: Secondary | ICD-10-CM

## 2015-01-15 LAB — CBC WITH DIFFERENTIAL/PLATELET
BASO%: 0.6 % (ref 0.0–2.0)
Basophils Absolute: 0 10*3/uL (ref 0.0–0.1)
EOS%: 2 % (ref 0.0–7.0)
Eosinophils Absolute: 0.1 10*3/uL (ref 0.0–0.5)
HEMATOCRIT: 34.6 % — AB (ref 34.8–46.6)
HEMOGLOBIN: 11.3 g/dL — AB (ref 11.6–15.9)
LYMPH%: 34.3 % (ref 14.0–49.7)
MCH: 31.6 pg (ref 25.1–34.0)
MCHC: 32.7 g/dL (ref 31.5–36.0)
MCV: 96.6 fL (ref 79.5–101.0)
MONO#: 0.5 10*3/uL (ref 0.1–0.9)
MONO%: 9.8 % (ref 0.0–14.0)
NEUT#: 2.7 10*3/uL (ref 1.5–6.5)
NEUT%: 53.3 % (ref 38.4–76.8)
Platelets: 329 10*3/uL (ref 145–400)
RBC: 3.58 10*6/uL — ABNORMAL LOW (ref 3.70–5.45)
RDW: 14.8 % — AB (ref 11.2–14.5)
WBC: 5 10*3/uL (ref 3.9–10.3)
lymph#: 1.7 10*3/uL (ref 0.9–3.3)

## 2015-01-15 LAB — COMPREHENSIVE METABOLIC PANEL (CC13)
ALK PHOS: 568 U/L — AB (ref 40–150)
ALT: 139 U/L — ABNORMAL HIGH (ref 0–55)
ANION GAP: 13 meq/L — AB (ref 3–11)
AST: 114 U/L — AB (ref 5–34)
Albumin: 3 g/dL — ABNORMAL LOW (ref 3.5–5.0)
BILIRUBIN TOTAL: 2.73 mg/dL — AB (ref 0.20–1.20)
BUN: 9.5 mg/dL (ref 7.0–26.0)
CALCIUM: 8.8 mg/dL (ref 8.4–10.4)
CO2: 28 mEq/L (ref 22–29)
CREATININE: 0.6 mg/dL (ref 0.6–1.1)
Chloride: 98 mEq/L (ref 98–109)
EGFR: >90 mL/min/{1.73_m2} (ref >90)
GLUCOSE: 157 mg/dL — AB (ref 70–140)
Potassium: 3.3 mEq/L — ABNORMAL LOW (ref 3.5–5.1)
Sodium: 139 mEq/L (ref 136–145)
Total Protein: 6.2 g/dL — ABNORMAL LOW (ref 6.4–8.3)

## 2015-01-15 MED ORDER — SODIUM CHLORIDE 0.9 % IV SOLN
400.0000 mg/m2 | Freq: Once | INTRAVENOUS | Status: AC
  Administered 2015-01-15: 646 mg via INTRAVENOUS
  Filled 2015-01-15: qty 16.99

## 2015-01-15 MED ORDER — METOCLOPRAMIDE HCL 10 MG PO TABS: 10.0000 mg | ORAL_TABLET | Freq: Three times a day (TID) | ORAL | Status: DC

## 2015-01-15 MED ORDER — SODIUM CHLORIDE 0.9 % IV SOLN
Freq: Once | INTRAVENOUS | Status: AC
Start: 2015-01-15 — End: 2015-01-15
  Administered 2015-01-15: 09:00:00 via INTRAVENOUS

## 2015-01-15 MED ORDER — LORAZEPAM 0.5 MG PO TABS: ORAL_TABLET | ORAL | Status: DC

## 2015-01-15 MED ORDER — PROCHLORPERAZINE MALEATE 10 MG PO TABS
ORAL_TABLET | ORAL | Status: AC
  Filled 2015-01-15: qty 1

## 2015-01-15 MED ORDER — PROMETHAZINE HCL 25 MG PO TABS: 25.0000 mg | ORAL_TABLET | Freq: Three times a day (TID) | ORAL | Status: DC | PRN

## 2015-01-15 MED ORDER — SODIUM CHLORIDE 0.9 % IV SOLN
1000.0000 mL | INTRAVENOUS | Status: DC
  Administered 2015-01-15: 10:00:00 via INTRAVENOUS

## 2015-01-15 MED ORDER — PROCHLORPERAZINE MALEATE 10 MG PO TABS
10.0000 mg | ORAL_TABLET | Freq: Once | ORAL | Status: AC
  Administered 2015-01-15: 10 mg via ORAL

## 2015-01-15 MED ORDER — HYDROMORPHONE HCL 4 MG/ML IJ SOLN
INTRAMUSCULAR | Status: AC
  Filled 2015-01-15: qty 1

## 2015-01-15 MED ORDER — HYDROMORPHONE HCL 4 MG/ML IJ SOLN
2.0000 mg | INTRAMUSCULAR | Status: DC | PRN
  Administered 2015-01-15: 2 mg via INTRAVENOUS

## 2015-01-15 MED ORDER — HYDROMORPHONE HCL 2 MG PO TABS
2.0000 mg | ORAL_TABLET | ORAL | Status: DC | PRN
Start: 2015-01-15 — End: 2015-01-27

## 2015-01-15 MED ORDER — SODIUM CHLORIDE 0.9 % IJ SOLN
10.0000 mL | INTRAMUSCULAR | Status: DC | PRN
  Administered 2015-01-15: 10 mL
  Filled 2015-01-15: qty 10

## 2015-01-15 MED ORDER — TAMSULOSIN HCL 0.4 MG PO CAPS: 0.4000 mg | ORAL_CAPSULE | Freq: Every evening | ORAL | Status: DC

## 2015-01-15 MED ORDER — HEPARIN SOD (PORK) LOCK FLUSH 100 UNIT/ML IV SOLN
500.0000 [IU] | Freq: Once | INTRAVENOUS | Status: AC | PRN
  Administered 2015-01-15: 500 [IU]
  Filled 2015-01-15: qty 5

## 2015-01-20 ENCOUNTER — Telehealth: Payer: Self-pay | Admitting: Oncology

## 2015-01-20 ENCOUNTER — Other Ambulatory Visit: Payer: Self-pay | Admitting: Oncology

## 2015-01-20 ENCOUNTER — Telehealth: Payer: Self-pay | Admitting: *Deleted

## 2015-01-20 ENCOUNTER — Other Ambulatory Visit: Payer: Self-pay | Admitting: *Deleted

## 2015-01-20 DIAGNOSIS — C259 Malignant neoplasm of pancreas, unspecified: Secondary | ICD-10-CM

## 2015-01-20 DIAGNOSIS — C561 Malignant neoplasm of right ovary: Secondary | ICD-10-CM

## 2015-01-20 NOTE — Telephone Encounter (Signed)
-----   Message from Gordy Levan, MD sent at 01/20/2015  8:19 AM EST ----- Labs seen and need follow up: gemzar dose decreased on 2-4, but I do not see her again until day of next treatment 2-15. Please check on her by phone and if possible get US liver and biliary tract this week. I did not put radiology order in so hopefully RN can let her know first    thanks

## 2015-01-20 NOTE — Telephone Encounter (Signed)
Called patient as noted below by Dr. Marko Plume. Patient agreeable to get [place] this week. Patient scheduled for this Thursday at 8:30 with 8:15 arrival time. NPO for 6 hours before [place]. Patient agreeable to this appt.   Patient says "I am having good days and bad days." She said she is still very nauseated and not able to keep much down - she has not vomited today though. She also said she has had an increase in pain and that it is worse in her pelvic area, back, and thighs. POF placed for patient to get IVF on Thursday as well since she will be at [facility]. Will followup with patient by phone tomorrow.

## 2015-01-20 NOTE — Telephone Encounter (Signed)
per pof to sch pt IV-sent MW email to sch pt trmt-will call pt after reply

## 2015-01-21 ENCOUNTER — Other Ambulatory Visit: Payer: Self-pay | Admitting: *Deleted

## 2015-01-21 ENCOUNTER — Telehealth: Payer: Self-pay | Admitting: *Deleted

## 2015-01-21 ENCOUNTER — Other Ambulatory Visit: Payer: Self-pay | Admitting: Oncology

## 2015-01-21 DIAGNOSIS — C561 Malignant neoplasm of right ovary: Secondary | ICD-10-CM

## 2015-01-21 DIAGNOSIS — C259 Malignant neoplasm of pancreas, unspecified: Secondary | ICD-10-CM

## 2015-01-21 MED ORDER — FENTANYL 25 MCG/HR TD PT72: MEDICATED_PATCH | TRANSDERMAL | Status: DC

## 2015-01-21 NOTE — Telephone Encounter (Signed)
Faxed signed prescriber response form regarding patient's drug therapy profile back to [facility]. Original sent to HIM to be scanned into patient's chart.

## 2015-01-21 NOTE — Telephone Encounter (Signed)
Per staff message and POF I have scheduled appts. Advised scheduler of appts. JMW  

## 2015-01-21 NOTE — Telephone Encounter (Addendum)
Called patient back to followup from yesterday's phone call - patient states she is feeling okay this morning but is still having frequent nausea and trouble keeping oral pain medications down. Confirmed appts for liver US and IVF tomorrow.  Sent inbox message to Dr. Marko Plume in regards to nausea and increased pain for possible consideration for CAD pump.

## 2015-01-22 ENCOUNTER — Ambulatory Visit (HOSPITAL_COMMUNITY)
Admission: RE | Admit: 2015-01-22 | Discharge: 2015-01-22 | Disposition: A | Discharge status: Home / Self Care | Payer: 59 | Source: Ambulatory Visit | Attending: Oncology | Admitting: Oncology

## 2015-01-22 ENCOUNTER — Ambulatory Visit (HOSPITAL_BASED_OUTPATIENT_CLINIC_OR_DEPARTMENT_OTHER): Payer: 59

## 2015-01-22 ENCOUNTER — Telehealth: Payer: Self-pay | Admitting: Oncology

## 2015-01-22 VITALS — BP 121/71 | HR 104 | Temp 97.9°F | Resp 18

## 2015-01-22 DIAGNOSIS — R945 Abnormal results of liver function studies: Secondary | ICD-10-CM

## 2015-01-22 DIAGNOSIS — K567 Ileus, unspecified: Secondary | ICD-10-CM | POA: Diagnosis present

## 2015-01-22 DIAGNOSIS — Z9109 Other allergy status, other than to drugs and biological substances: Secondary | ICD-10-CM

## 2015-01-22 DIAGNOSIS — R112 Nausea with vomiting, unspecified: Secondary | ICD-10-CM | POA: Diagnosis not present

## 2015-01-22 DIAGNOSIS — C259 Malignant neoplasm of pancreas, unspecified: Secondary | ICD-10-CM | POA: Diagnosis present

## 2015-01-22 DIAGNOSIS — G608 Other hereditary and idiopathic neuropathies: Secondary | ICD-10-CM | POA: Diagnosis present

## 2015-01-22 DIAGNOSIS — M1990 Unspecified osteoarthritis, unspecified site: Secondary | ICD-10-CM | POA: Diagnosis present

## 2015-01-22 DIAGNOSIS — C252 Malignant neoplasm of tail of pancreas: Secondary | ICD-10-CM

## 2015-01-22 DIAGNOSIS — K831 Obstruction of bile duct: Secondary | ICD-10-CM | POA: Diagnosis present

## 2015-01-22 DIAGNOSIS — F419 Anxiety disorder, unspecified: Secondary | ICD-10-CM | POA: Diagnosis present

## 2015-01-22 DIAGNOSIS — C561 Malignant neoplasm of right ovary: Secondary | ICD-10-CM | POA: Diagnosis present

## 2015-01-22 DIAGNOSIS — C786 Secondary malignant neoplasm of retroperitoneum and peritoneum: Principal | ICD-10-CM | POA: Diagnosis present

## 2015-01-22 DIAGNOSIS — Z880 Allergy status to penicillin: Secondary | ICD-10-CM

## 2015-01-22 DIAGNOSIS — I10 Essential (primary) hypertension: Secondary | ICD-10-CM | POA: Diagnosis present

## 2015-01-22 DIAGNOSIS — K219 Gastro-esophageal reflux disease without esophagitis: Secondary | ICD-10-CM | POA: Diagnosis present

## 2015-01-22 DIAGNOSIS — Z881 Allergy status to other antibiotic agents status: Secondary | ICD-10-CM

## 2015-01-22 DIAGNOSIS — R110 Nausea: Secondary | ICD-10-CM

## 2015-01-22 DIAGNOSIS — E119 Type 2 diabetes mellitus without complications: Secondary | ICD-10-CM | POA: Diagnosis present

## 2015-01-22 DIAGNOSIS — R932 Abnormal findings on diagnostic imaging of liver and biliary tract: Secondary | ICD-10-CM | POA: Insufficient documentation

## 2015-01-22 DIAGNOSIS — Z887 Allergy status to serum and vaccine status: Secondary | ICD-10-CM

## 2015-01-22 DIAGNOSIS — Z79891 Long term (current) use of opiate analgesic: Secondary | ICD-10-CM

## 2015-01-22 DIAGNOSIS — Z66 Do not resuscitate: Secondary | ICD-10-CM | POA: Diagnosis present

## 2015-01-22 DIAGNOSIS — Z79899 Other long term (current) drug therapy: Secondary | ICD-10-CM

## 2015-01-22 DIAGNOSIS — R109 Unspecified abdominal pain: Secondary | ICD-10-CM

## 2015-01-22 DIAGNOSIS — K5660 Unspecified intestinal obstruction: Secondary | ICD-10-CM | POA: Diagnosis present

## 2015-01-22 DIAGNOSIS — G893 Neoplasm related pain (acute) (chronic): Secondary | ICD-10-CM | POA: Diagnosis present

## 2015-01-22 DIAGNOSIS — I871 Compression of vein: Secondary | ICD-10-CM | POA: Diagnosis present

## 2015-01-22 DIAGNOSIS — N3090 Cystitis, unspecified without hematuria: Secondary | ICD-10-CM | POA: Diagnosis present

## 2015-01-22 DIAGNOSIS — D329 Benign neoplasm of meninges, unspecified: Secondary | ICD-10-CM | POA: Diagnosis present

## 2015-01-22 DIAGNOSIS — R17 Unspecified jaundice: Secondary | ICD-10-CM | POA: Insufficient documentation

## 2015-01-22 DIAGNOSIS — I447 Left bundle-branch block, unspecified: Secondary | ICD-10-CM | POA: Diagnosis present

## 2015-01-22 DIAGNOSIS — Z515 Encounter for palliative care: Secondary | ICD-10-CM

## 2015-01-22 MED ORDER — PROMETHAZINE HCL 25 MG/ML IJ SOLN
25.0000 mg | Freq: Once | INTRAMUSCULAR | Status: AC
  Administered 2015-01-22: 25 mg via INTRAVENOUS
  Filled 2015-01-22: qty 1

## 2015-01-22 MED ORDER — HYDROMORPHONE HCL 4 MG/ML IJ SOLN
INTRAMUSCULAR | Status: AC
  Filled 2015-01-22: qty 1

## 2015-01-22 MED ORDER — ONDANSETRON 8 MG/50ML IVPB (CHCC)
8.0000 mg | Freq: Once | INTRAVENOUS | Status: AC | PRN
  Administered 2015-01-22: 8 mg via INTRAVENOUS

## 2015-01-22 MED ORDER — ONDANSETRON 8 MG/NS 50 ML IVPB
INTRAVENOUS | Status: AC
  Filled 2015-01-22: qty 8

## 2015-01-22 MED ORDER — HYDROMORPHONE HCL 4 MG/ML IJ SOLN
0.5000 mg | Freq: Once | INTRAMUSCULAR | Status: AC
  Administered 2015-01-22: 0.5 mg via INTRAVENOUS

## 2015-01-22 MED ORDER — HYDROMORPHONE HCL 1 MG/ML IJ SOLN
0.5000 mg | Freq: Once | INTRAMUSCULAR | Status: AC | PRN
  Administered 2015-01-22: 0.5 mg via INTRAVENOUS
  Filled 2015-01-22: qty 0.5

## 2015-01-22 MED ORDER — HYDROMORPHONE HCL 4 MG/ML IJ SOLN
INTRAMUSCULAR | Status: AC
Start: 2015-01-22 — End: 2015-01-22
  Filled 2015-01-22: qty 1

## 2015-01-22 MED ORDER — SODIUM CHLORIDE 0.9 % IV SOLN
INTRAVENOUS | Status: DC
  Administered 2015-01-22: 10:00:00 via INTRAVENOUS

## 2015-01-22 NOTE — Telephone Encounter (Signed)
Medical Oncology  Patient in infusion area today getting IVF after abdominal US, new jaundice noted. She then left office prior to being worked in with MD and preferred not to come back to office to be seen.  I spoke with her by phone now. She has noticed jaundice last 2-3 days, no puritis. She has had increased pain "under ribs" and in abdomen. She vomited after returning home, has not kept down po's today (but had IVF). She has gotten duragesic and has put on total 50 mcg as instructed; she will stop trying to take the opana. I have told her that she has some mild biliary dilatation on [place] today, without obvious common bile duct stone. CT may be more helpful in evaluating the new jaundice and recent new elevation in LFTs; she would like to do a scan. I have told her that we should not give further gemzar if LFTs remain high. I have reviewed meds as listed and told patient not to use amitriptyline now and to be careful with ativan.  I have told her that I agree with pain medication via PAC/ pump when we have home nursing to support this, likely with hospice as soon as

## 2015-01-22 NOTE — Progress Notes (Signed)
Patient reports nausea has resolved. Patient transported via wheelchair with spouse.

## 2015-01-22 NOTE — Telephone Encounter (Signed)
MEDICAL ONCOLOGY  Patient in infusion area today for IVF after abdominal US, new jaundice noted. She then left office prior to being worked in with MD and preferred not to come back to office to be seen.  I spoke with patient by phone at 1730. She has noticed jaundice last 2-3 days, no pruritis. She has had increased pain "under ribs" and in abdomen. She vomited after returning home today, has not kept down po's today (but had IVF). She has gotten duragesic and has put on total 50 mcg as instructed; she will stop opana and knows to use prn dilaudid until patches effective. I have told her that she has some mild biliary dilataion on [place] today, without obvious common bile duct stone. CT may be more helpful in evaluating the new jaundice and recent new elevation in LFTs; she would like to do a scan. I have told her that we should not give further gemzar if LFTs remain high. I have reviewed meds as listed and told patient not to use amitriptyline now and to be careful with ativan. I have told her that I agree with pain medication via PAC/pump when we have home nursing to support this, likely with hospice as soon as we stop attempting chemotherapy. Patient will keep MD appointment on 2-15 whether or not CT has been done. She should go to ED if too uncomfortable despite what we can do outpatient now.  Patient understood discussion and appreciated call. She knows to call if needed prior to next appointments.  Godfrey Pick, MD

## 2015-01-22 NOTE — Patient Instructions (Signed)
Dehydration, Adult Dehydration is when you lose more fluids from the body than you take in. Vital organs like the kidneys, brain, and heart cannot function without a proper amount of fluids and salt. Any loss of fluids from the body can cause dehydration.  CAUSES   Vomiting.  Diarrhea.  Excessive sweating.  Excessive urine output.  Fever. SYMPTOMS  Mild dehydration  Thirst.  Dry lips.  Slightly dry mouth. Moderate dehydration  Very dry mouth.  Sunken eyes.  Skin does not bounce back quickly when lightly pinched and released.  Dark urine and decreased urine production.  Decreased tear production.  Headache. Severe dehydration  Very dry mouth.  Extreme thirst.  Rapid, weak pulse (more than 100 beats per minute at rest).  Cold hands and feet.  Not able to sweat in spite of heat and temperature.  Rapid breathing.  Blue lips.  Confusion and lethargy.  Difficulty being awakened.  Minimal urine production.  No tears. DIAGNOSIS  Your caregiver will diagnose dehydration based on your symptoms and your exam. Blood and urine tests will help confirm the diagnosis. The diagnostic evaluation should also identify the cause of dehydration. TREATMENT  Treatment of mild or moderate dehydration can often be done at home by increasing the amount of fluids that you drink. It is best to drink small amounts of fluid more often. Drinking too much at one time can make vomiting worse. Refer to the home care instructions below. Severe dehydration needs to be treated at the hospital where you will probably be given intravenous (IV) fluids that contain water and electrolytes. HOME CARE INSTRUCTIONS   Ask your caregiver about specific rehydration instructions.  Drink enough fluids to keep your urine clear or pale yellow.  Drink small amounts frequently if you have nausea and vomiting.  Eat as you normally do.  Avoid:  Foods or drinks high in sugar.  Carbonated  drinks.  Juice.  Extremely hot or cold fluids.  Drinks with caffeine.  Fatty, greasy foods.  Alcohol.  Tobacco.  Overeating.  Gelatin desserts.  Wash your hands well to avoid spreading bacteria and viruses.  Only take over-the-counter or prescription medicines for pain, discomfort, or fever as directed by your caregiver.  Ask your caregiver if you should continue all prescribed and over-the-counter medicines.  Keep all follow-up appointments with your caregiver. SEEK MEDICAL CARE IF:  You have abdominal pain and it increases or stays in one area (localizes).  You have a rash, stiff neck, or severe headache.  You are irritable, sleepy, or difficult to awaken.  You are weak, dizzy, or extremely thirsty. SEEK IMMEDIATE MEDICAL CARE IF:   You are unable to keep fluids down or you get worse despite treatment.  You have frequent episodes of vomiting or diarrhea.  You have blood or green matter (bile) in your vomit.  You have blood in your stool or your stool looks black and tarry.  You have not urinated in 6 to 8 hours, or you have only urinated a small amount of very dark urine.  You have a fever.  You faint. MAKE SURE YOU:   Understand these instructions.  Will watch your condition.  Will get help right away if you are not doing well or get worse. Document Released: 11/28/2005 Document Revised: 02/20/2012 Document Reviewed: 07/18/2011 ExitCare Patient Information 2015 [employer]. This information is not intended to replace advice given to you by your health care provider. Make sure you discuss any questions you have with your health care   provider.  

## 2015-01-23 ENCOUNTER — Telehealth: Payer: Self-pay

## 2015-01-23 ENCOUNTER — Emergency Department (HOSPITAL_COMMUNITY): Payer: 59

## 2015-01-23 ENCOUNTER — Inpatient Hospital Stay (HOSPITAL_COMMUNITY)
Admission: EM | Admit: 2015-01-23 | Discharge: 2015-01-27 | DRG: 374 | Disposition: A | Discharge status: Hospice | Payer: 59 | Attending: Internal Medicine | Admitting: Internal Medicine

## 2015-01-23 ENCOUNTER — Other Ambulatory Visit: Payer: Self-pay

## 2015-01-23 ENCOUNTER — Other Ambulatory Visit: Payer: Self-pay | Admitting: Oncology

## 2015-01-23 ENCOUNTER — Other Ambulatory Visit (HOSPITAL_COMMUNITY): Payer: Self-pay

## 2015-01-23 ENCOUNTER — Encounter (HOSPITAL_COMMUNITY): Payer: Self-pay

## 2015-01-23 DIAGNOSIS — B3731 Acute candidiasis of vulva and vagina: Secondary | ICD-10-CM

## 2015-01-23 DIAGNOSIS — G893 Neoplasm related pain (acute) (chronic): Secondary | ICD-10-CM | POA: Diagnosis present

## 2015-01-23 DIAGNOSIS — I10 Essential (primary) hypertension: Secondary | ICD-10-CM | POA: Diagnosis present

## 2015-01-23 DIAGNOSIS — R079 Chest pain, unspecified: Secondary | ICD-10-CM

## 2015-01-23 DIAGNOSIS — Z887 Allergy status to serum and vaccine status: Secondary | ICD-10-CM | POA: Diagnosis not present

## 2015-01-23 DIAGNOSIS — Z79899 Other long term (current) drug therapy: Secondary | ICD-10-CM | POA: Diagnosis not present

## 2015-01-23 DIAGNOSIS — Z880 Allergy status to penicillin: Secondary | ICD-10-CM | POA: Diagnosis not present

## 2015-01-23 DIAGNOSIS — R112 Nausea with vomiting, unspecified: Secondary | ICD-10-CM | POA: Diagnosis present

## 2015-01-23 DIAGNOSIS — K5669 Other intestinal obstruction: Secondary | ICD-10-CM

## 2015-01-23 DIAGNOSIS — Z881 Allergy status to other antibiotic agents status: Secondary | ICD-10-CM | POA: Diagnosis not present

## 2015-01-23 DIAGNOSIS — Z9109 Other allergy status, other than to drugs and biological substances: Secondary | ICD-10-CM | POA: Diagnosis not present

## 2015-01-23 DIAGNOSIS — R1012 Left upper quadrant pain: Secondary | ICD-10-CM

## 2015-01-23 DIAGNOSIS — N3090 Cystitis, unspecified without hematuria: Secondary | ICD-10-CM | POA: Diagnosis present

## 2015-01-23 DIAGNOSIS — C786 Secondary malignant neoplasm of retroperitoneum and peritoneum: Secondary | ICD-10-CM | POA: Diagnosis present

## 2015-01-23 DIAGNOSIS — C259 Malignant neoplasm of pancreas, unspecified: Secondary | ICD-10-CM

## 2015-01-23 DIAGNOSIS — G608 Other hereditary and idiopathic neuropathies: Secondary | ICD-10-CM | POA: Diagnosis present

## 2015-01-23 DIAGNOSIS — Z79891 Long term (current) use of opiate analgesic: Secondary | ICD-10-CM | POA: Diagnosis not present

## 2015-01-23 DIAGNOSIS — Z515 Encounter for palliative care: Secondary | ICD-10-CM | POA: Diagnosis not present

## 2015-01-23 DIAGNOSIS — C179 Malignant neoplasm of small intestine, unspecified: Secondary | ICD-10-CM

## 2015-01-23 DIAGNOSIS — C561 Malignant neoplasm of right ovary: Secondary | ICD-10-CM

## 2015-01-23 DIAGNOSIS — K567 Ileus, unspecified: Secondary | ICD-10-CM | POA: Diagnosis present

## 2015-01-23 DIAGNOSIS — K831 Obstruction of bile duct: Secondary | ICD-10-CM | POA: Diagnosis present

## 2015-01-23 DIAGNOSIS — C569 Malignant neoplasm of unspecified ovary: Secondary | ICD-10-CM | POA: Diagnosis present

## 2015-01-23 DIAGNOSIS — D329 Benign neoplasm of meninges, unspecified: Secondary | ICD-10-CM | POA: Diagnosis present

## 2015-01-23 DIAGNOSIS — K56609 Unspecified intestinal obstruction, unspecified as to partial versus complete obstruction: Secondary | ICD-10-CM | POA: Diagnosis present

## 2015-01-23 DIAGNOSIS — K5660 Unspecified intestinal obstruction: Secondary | ICD-10-CM | POA: Diagnosis present

## 2015-01-23 DIAGNOSIS — M1990 Unspecified osteoarthritis, unspecified site: Secondary | ICD-10-CM | POA: Diagnosis present

## 2015-01-23 DIAGNOSIS — F419 Anxiety disorder, unspecified: Secondary | ICD-10-CM | POA: Diagnosis present

## 2015-01-23 DIAGNOSIS — E119 Type 2 diabetes mellitus without complications: Secondary | ICD-10-CM | POA: Diagnosis present

## 2015-01-23 DIAGNOSIS — I871 Compression of vein: Secondary | ICD-10-CM | POA: Diagnosis present

## 2015-01-23 DIAGNOSIS — I447 Left bundle-branch block, unspecified: Secondary | ICD-10-CM | POA: Diagnosis present

## 2015-01-23 DIAGNOSIS — K219 Gastro-esophageal reflux disease without esophagitis: Secondary | ICD-10-CM | POA: Diagnosis present

## 2015-01-23 DIAGNOSIS — C252 Malignant neoplasm of tail of pancreas: Secondary | ICD-10-CM

## 2015-01-23 DIAGNOSIS — B373 Candidiasis of vulva and vagina: Secondary | ICD-10-CM

## 2015-01-23 DIAGNOSIS — R109 Unspecified abdominal pain: Secondary | ICD-10-CM

## 2015-01-23 DIAGNOSIS — Z66 Do not resuscitate: Secondary | ICD-10-CM | POA: Diagnosis present

## 2015-01-23 DIAGNOSIS — C250 Malignant neoplasm of head of pancreas: Secondary | ICD-10-CM

## 2015-01-23 LAB — CBC WITH DIFFERENTIAL/PLATELET
Basophils Absolute: 0 10*3/uL (ref 0.0–0.1)
Basophils Relative: 0 % (ref 0–1)
Eosinophils Absolute: 0 10*3/uL (ref 0.0–0.7)
Eosinophils Relative: 0 % (ref 0–5)
HEMATOCRIT: 32.9 % — AB (ref 36.0–46.0)
HEMOGLOBIN: 11 g/dL — AB (ref 12.0–15.0)
Lymphocytes Relative: 33 % (ref 12–46)
Lymphs Abs: 1.8 10*3/uL (ref 0.7–4.0)
MCH: 31.3 pg (ref 26.0–34.0)
MCHC: 33.4 g/dL (ref 30.0–36.0)
MCV: 93.5 fL (ref 78.0–100.0)
MONO ABS: 0.8 10*3/uL (ref 0.1–1.0)
Monocytes Relative: 14 % — ABNORMAL HIGH (ref 3–12)
Neutro Abs: 2.9 10*3/uL (ref 1.7–7.7)
Neutrophils Relative %: 53 % (ref 43–77)
Platelets: 285 10*3/uL (ref 150–400)
RBC: 3.52 MIL/uL — ABNORMAL LOW (ref 3.87–5.11)
RDW: 16.2 % — AB (ref 11.5–15.5)
WBC: 5.5 10*3/uL (ref 4.0–10.5)

## 2015-01-23 LAB — LIPASE, BLOOD: LIPASE: 12 U/L (ref 11–59)

## 2015-01-23 LAB — COMPREHENSIVE METABOLIC PANEL
ALBUMIN: 2.7 g/dL — AB (ref 3.5–5.2)
ALT: 113 U/L — ABNORMAL HIGH (ref 0–35)
ANION GAP: 11 (ref 5–15)
AST: 118 U/L — AB (ref 0–37)
Alkaline Phosphatase: 679 U/L — ABNORMAL HIGH (ref 39–117)
BUN: 10 mg/dL (ref 6–23)
CALCIUM: 8.8 mg/dL (ref 8.4–10.5)
CO2: 25 mmol/L (ref 19–32)
Chloride: 97 mmol/L (ref 96–112)
Glucose, Bld: 121 mg/dL — ABNORMAL HIGH (ref 70–99)
Potassium: 3.6 mmol/L (ref 3.5–5.1)
Sodium: 133 mmol/L — ABNORMAL LOW (ref 135–145)
Total Bilirubin: 11.9 mg/dL — ABNORMAL HIGH (ref 0.3–1.2)
Total Protein: 5.8 g/dL — ABNORMAL LOW (ref 6.0–8.3)

## 2015-01-23 LAB — PROTIME-INR
INR: 1.09 (ref 0.00–1.49)
Prothrombin Time: 14.2 seconds (ref 11.6–15.2)

## 2015-01-23 LAB — APTT: aPTT: 32 seconds (ref 24–37)

## 2015-01-23 MED ORDER — METOCLOPRAMIDE HCL 5 MG/ML IJ SOLN
5.0000 mg | Freq: Three times a day (TID) | INTRAMUSCULAR | Status: DC
  Administered 2015-01-23 – 2015-01-24 (×2): 5 mg via INTRAVENOUS
  Filled 2015-01-23: qty 2
  Filled 2015-01-23 (×2): qty 1
  Filled 2015-01-23: qty 2
  Filled 2015-01-23: qty 1

## 2015-01-23 MED ORDER — SODIUM CHLORIDE 0.9 % IV BOLUS (SEPSIS)
1000.0000 mL | Freq: Once | INTRAVENOUS | Status: AC
  Administered 2015-01-23: 1000 mL via INTRAVENOUS

## 2015-01-23 MED ORDER — HYDROMORPHONE HCL 2 MG/ML IJ SOLN
2.0000 mg | Freq: Once | INTRAMUSCULAR | Status: AC
  Administered 2015-01-23: 2 mg via INTRAVENOUS
  Filled 2015-01-23: qty 1

## 2015-01-23 MED ORDER — LORAZEPAM 2 MG/ML IJ SOLN
0.5000 mg | INTRAMUSCULAR | Status: DC | PRN
  Administered 2015-01-23 – 2015-01-26 (×4): 0.5 mg via INTRAVENOUS
  Filled 2015-01-23 (×4): qty 1

## 2015-01-23 MED ORDER — ONDANSETRON HCL 4 MG/2ML IJ SOLN
4.0000 mg | Freq: Once | INTRAMUSCULAR | Status: AC
  Administered 2015-01-23: 4 mg via INTRAVENOUS
  Filled 2015-01-23: qty 2

## 2015-01-23 MED ORDER — IOHEXOL 300 MG/ML  SOLN
100.0000 mL | Freq: Once | INTRAMUSCULAR | Status: AC | PRN
  Administered 2015-01-23: 100 mL via INTRAVENOUS

## 2015-01-23 MED ORDER — LORAZEPAM 2 MG/ML IJ SOLN: 0.5000 mg | Freq: Once | INTRAMUSCULAR | Status: DC

## 2015-01-23 MED ORDER — HYDROMORPHONE HCL 1 MG/ML IJ SOLN
2.0000 mg | Freq: Once | INTRAMUSCULAR | Status: AC
  Administered 2015-01-23: 2 mg via INTRAVENOUS
  Filled 2015-01-23: qty 2

## 2015-01-23 MED ORDER — PROMETHAZINE HCL 25 MG/ML IJ SOLN
12.5000 mg | Freq: Once | INTRAMUSCULAR | Status: AC
  Administered 2015-01-23: 12.5 mg via INTRAVENOUS
  Filled 2015-01-23: qty 1

## 2015-01-23 MED ORDER — HYDROMORPHONE HCL 1 MG/ML IJ SOLN
1.0000 mg | INTRAMUSCULAR | Status: DC | PRN
  Administered 2015-01-23 – 2015-01-24 (×4): 2 mg via INTRAVENOUS
  Filled 2015-01-23 (×4): qty 2

## 2015-01-23 MED ORDER — FENTANYL 50 MCG/HR TD PT72
50.0000 ug | MEDICATED_PATCH | TRANSDERMAL | Status: DC
  Administered 2015-01-25: 50 ug via TRANSDERMAL
  Filled 2015-01-23: qty 1

## 2015-01-23 MED ORDER — FLUTICASONE PROPIONATE 50 MCG/ACT NA SUSP: 1.0000 | Freq: Every day | NASAL | Status: DC | PRN

## 2015-01-23 MED ORDER — SODIUM CHLORIDE 0.9 % IV SOLN
INTRAVENOUS | Status: DC
  Administered 2015-01-23 – 2015-01-26 (×6): via INTRAVENOUS
  Administered 2015-01-27: 75 mL via INTRAVENOUS

## 2015-01-23 MED ORDER — SODIUM CHLORIDE 0.9 % IV SOLN
INTRAVENOUS | Status: DC
  Administered 2015-01-23: 18:00:00 via INTRAVENOUS

## 2015-01-23 MED ORDER — PROMETHAZINE HCL 25 MG PO TABS: 25.0000 mg | ORAL_TABLET | Freq: Four times a day (QID) | ORAL | Status: DC | PRN

## 2015-01-23 MED ORDER — PROMETHAZINE HCL 25 MG/ML IJ SOLN
12.5000 mg | Freq: Four times a day (QID) | INTRAMUSCULAR | Status: DC | PRN
  Administered 2015-01-24 (×2): 12.5 mg via INTRAVENOUS
  Filled 2015-01-23 (×2): qty 1

## 2015-01-23 MED ORDER — IOHEXOL 300 MG/ML  SOLN
50.0000 mL | Freq: Once | INTRAMUSCULAR | Status: AC | PRN
  Administered 2015-01-23: 50 mL via ORAL

## 2015-01-23 MED ORDER — FAMOTIDINE IN NACL 20-0.9 MG/50ML-% IV SOLN
20.0000 mg | Freq: Once | INTRAVENOUS | Status: AC
  Administered 2015-01-23: 20 mg via INTRAVENOUS
  Filled 2015-01-23: qty 50

## 2015-01-23 MED ORDER — PROMETHAZINE HCL 25 MG RE SUPP: 25.0000 mg | Freq: Four times a day (QID) | RECTAL | Status: DC | PRN

## 2015-01-23 NOTE — ED Provider Notes (Signed)
CSN: [id number]     Arrival date & time 01/23/15  1355 History   First MD Initiated Contact with Patient 01/23/15 1504     Chief Complaint  Patient presents with  . Chest Pain     (Consider location/radiation/quality/duration/timing/severity/associated sxs/prior Treatment) HPI Comments: Patient here with upper abdominal pain 3 days with associated jaundice. Patient has history of pancreatic cancer and was seen by her doctor yesterday for similar symptoms. According to records, patient had abdominal ultrasound which did not show any cause of her current symptoms. He was recommended that she have an abdominal CT for further evaluation. Patient notes discolored urine without fever. Patient has been vomiting bilious material according to her and does note abdominal distention. She has been unable to keep down her pain medications. Called her oncologist today and told to come in for further evaluation.  Patient is a 59 y.o. female presenting with chest pain. The history is provided by the patient and medical records.  Chest Pain   Past Medical History  Diagnosis Date  . Hypertension   . Left bundle branch block (LBBB) age 42  . Anxiety   . GERD (gastroesophageal reflux disease)   . H/O hiatal hernia     small  . Arthritis   . Idiopathic neuropathy     biochemical  . Meningioma     x2  . Diabetes mellitus without complication     type 2 metformin  . Cancer     ovarian   Past Surgical History  Procedure Laterality Date  . Abdominal hysterectomy      with left salpingo-oophorectomy, performed via laparotomy for edometriosis at age 63  . Inguinal hernia repair Bilateral 2008  . Appendectomy  early 30's    ruptured for a month before surgery  . Knee surgery Bilateral 2006    x2 on right, x1 left  . Salpingoophorectomy Right 07/01/2014    Procedure: EXPLORATORY LAPAROTOMY/RIGHT SALPINGO OOPHORECTOMY, OMENTECTOMY, TUMOR DEBULKING;  Surgeon: Everitt Amber, MD;  Location: [facility];  Service:  Gynecology;  Laterality: Right;  . Abdominal surgery    . Esophagogastroduodenoscopy (egd) with propofol N/A 07/30/2014    Procedure: ESOPHAGOGASTRODUODENOSCOPY (EGD) WITH PROPOFOL;  Surgeon: Arta Silence, MD;  Location: [facility];  Service: Endoscopy;  Laterality: N/A;  . Eus N/A 07/30/2014    Procedure: ESOPHAGEAL ENDOSCOPIC ULTRASOUND (EUS) RADIAL;  Surgeon: Arta Silence, MD;  Location: [facility];  Service: Endoscopy;  Laterality: N/A;   Family History  Problem Relation Age of Onset  . Anesthesia problems Cousin     colon   History  Substance Use Topics  . Smoking status: Never Smoker   . Smokeless tobacco: Never Used  . Alcohol Use: No   OB History    No data available     Review of Systems  Cardiovascular: Positive for chest pain.  All other systems reviewed and are negative.     Allergies  Fluzone; Penicillins; Sulfa antibiotics; and Tape  Home Medications   Prior to Admission medications   Medication Sig Start Date End Date Taking? Authorizing Provider  amitriptyline (ELAVIL) 25 MG tablet Take 1 tablet (25 mg total) by mouth at bedtime. 12/01/14   Lennis Marion Downer, MD  baclofen (LIORESAL) 10 MG tablet Take 10 mg by mouth 3 (three) times daily as needed for muscle spasms.     Historical Provider, MD  docusate sodium (COLACE) 100 MG capsule Take 200 mg by mouth 2 (two) times daily.    Historical Provider, MD  fentaNYL ([place] -  DOSED MCG/HR) 25 MCG/HR patch Place 2 patches on skin every 72 hours or as directed. 01/21/15   Lennis Marion Downer, MD  ferrous fumarate (HEMOCYTE) 325 (106 FE) MG TABS tablet Take 1 tablet daily on an empty stomach with OJ or 500 mg Vitamin C tablet 10/10/14   Lennis P Livesay, MD  filgrastim (NEUPOGEN) 300 MCG/ML injection Inject 300 mcg SQ daily x 2 days after chemotherapy or as directed. Patient not taking: Reported on 12/25/2014 12/11/14   Gordy Levan, MD  fluticasone (FLONASE) 50 MCG/ACT nasal spray Place 1 spray into both  nostrils daily as needed for allergies.     Historical Provider, MD  furosemide (LASIX) 20 MG tablet Take 20 mg by mouth daily as needed for edema.     Historical Provider, MD  glycerin adult 2 G SUPP Place 1 suppository rectally once as needed for moderate constipation.    Historical Provider, MD  HYDROmorphone (DILAUDID) 2 MG tablet Take 1-2 tablets (2-4 mg total) by mouth every 4 (four) hours as needed for severe pain. 01/15/15   Lennis Marion Downer, MD  lidocaine-prilocaine (EMLA) cream Apply 1 application topically as needed. 08/26/14   Lennis Marion Downer, MD  LORazepam (ATIVAN) 0.5 MG tablet Place 1-2  tablet under the tongue or swallow every 6 as needed for nausea. 01/15/15   Lennis Marion Downer, MD  magnesium citrate SOLN Take 0.5 Bottles by mouth as needed for severe constipation.    Historical Provider, MD  metoCLOPramide (REGLAN) 10 MG tablet Take 1 tablet (10 mg total) by mouth 4 (four) times daily -  before meals and at bedtime. 01/15/15   Lennis Marion Downer, MD  NEEDLE, DISP, 25 G (BD DISP NEEDLES) 25G X 5/8" MISC Use SQ as directed for Neupogen injections Patient not taking: Reported on 12/15/2014 09/23/14   Lennis Marion Downer, MD  omeprazole (PRILOSEC) 20 MG capsule Take 20 mg by mouth daily.    Historical Provider, MD  ondansetron (ZOFRAN ODT) 8 MG disintegrating tablet Take 1-2 tablets (8-16 mg total) by mouth every 12 (twelve) hours as needed for nausea or vomiting. 08/06/14   Lennis Marion Downer, MD  oseltamivir (TAMIFLU) 75 MG capsule Take 1 capsule by mouth daily as directed for exposure to flu. Patient not taking: Reported on 12/15/2014 09/12/14   Gordy Levan, MD  oxymorphone (OPANA ER) 30 MG 12 hr tablet Take 1 tablet (30 mg total) by mouth every 12 (twelve) hours. 12/25/14   Lennis Marion Downer, MD  phenazopyridine (PYRIDIUM) 200 MG tablet Take 1 tablet (200 mg total) by mouth 3 (three) times daily. 12/15/14   Quintella Reichert, MD  polyethylene glycol [facility]) packet Take 17 g by mouth daily.     Historical Provider, MD  promethazine (PHENERGAN) 25 MG tablet Take 1 tablet (25 mg total) by mouth 3 (three) times daily as needed for nausea or vomiting. 01/15/15   Lennis Marion Downer, MD  senna (SENOKOT) 8.6 MG TABS tablet Take 3 tablets by mouth 2 (two) times daily.     Historical Provider, MD  sodium phosphate (FLEET) enema Place 1 enema rectally as needed. follow package directions    Historical Provider, MD  tamsulosin (FLOMAX) 0.4 MG CAPS capsule Take 1 capsule (0.4 mg total) by mouth every evening. 01/15/15   Lennis P Livesay, MD   BP 136/85 mmHg  Pulse 121  Temp(Src) 97.8 F (36.6 C) (Oral)  Resp 18  SpO2 94% Physical Exam  Constitutional: She is oriented to person,  place, and time. She appears well-developed and well-nourished.  Non-toxic appearance. No distress.  HENT:  Head: Normocephalic and atraumatic.  Eyes: Conjunctivae, EOM and lids are normal. Pupils are equal, round, and reactive to light. Scleral icterus is present.  Neck: Normal range of motion. Neck supple. No tracheal deviation present. No thyroid mass present.  Cardiovascular: Regular rhythm and normal heart sounds.  Tachycardia present.  Exam reveals no gallop.   No murmur heard. Pulmonary/Chest: Effort normal and breath sounds normal. No stridor. No respiratory distress. She has no decreased breath sounds. She has no wheezes. She has no rhonchi. She has no rales.  Abdominal: Soft. Normal appearance and bowel sounds are normal. She exhibits distension. There is tenderness in the epigastric area. There is guarding. There is no rebound and no CVA tenderness.    Musculoskeletal: Normal range of motion. She exhibits no edema or tenderness.  Neurological: She is alert and oriented to person, place, and time. She has normal strength. No cranial nerve deficit or sensory deficit. GCS eye subscore is 4. GCS verbal subscore is 5. GCS motor subscore is 6.  Skin: Skin is warm and dry. No abrasion and no rash noted.  Diffuse  jaundice noted  Psychiatric: She has a normal mood and affect. Her speech is normal and behavior is normal.  Nursing note and vitals reviewed.   ED Course  Procedures (including critical care time) Labs Review Labs Reviewed  CBC WITH DIFFERENTIAL/PLATELET  COMPREHENSIVE METABOLIC PANEL  LIPASE, BLOOD  PROTIME-INR  APTT    Imaging Review US Abdomen Limited  01/22/2015   CLINICAL DATA:  Elevated liver function studies.  Jaundice.  EXAM: US ABDOMEN LIMITED - RIGHT UPPER QUADRANT  COMPARISON:  Multiple prior CT scans and abdominal MRI 04/2014  FINDINGS: Gallbladder:  The gallbladder is full of echogenic material which is likely a combination of tumefactive sludge and gallstones. There is also gallbladder wall thickening. Some of this could be due to the surrounding fluid. Negative sonographic Murphy sign.  Common bile duct:  Diameter: 7.0 mm  Liver:  The liver contour is slightly irregular. No focal hepatic lesions. Mild central intrahepatic biliary dilatation. Perihepatic fluid is noted.  IMPRESSION: 1. The gallbladder is full of echogenic material likely a combination of tumefactive sludge and tiny gallstones. Mild gallbladder wall thickening is likely due to the surrounding fluid. 2. Mild biliary dilatation. No obvious common bile duct stone. ERCP or MRCP may be helpful for further evaluation. 3. No focal hepatic lesions. 4. Persistent and slightly progressive perihepatic fluid.   Electronically Signed   By: Marijo Sanes M.D.   On: 01/22/2015 09:12     EKG Interpretation None      MDM   Final diagnoses:  Chest pain  Abdominal pain    Patient given IV fluids and pain medications. No active vomiting here. Will not place NG tube at this time as patient like to avoid that. Will be admitted to the medicine service    Leota Jacobsen, MD 01/23/15 [phone]

## 2015-01-23 NOTE — ED Notes (Signed)
Patient transported to CT 

## 2015-01-23 NOTE — Telephone Encounter (Signed)
Kristin Meadows states that she cannot keep anything down.She is experiencing a lot of pain in chest and abdomen.  She took 2 Dilaudid tabs earlier.  Her pain level was 3-4/10.  She does not know if she vomited up the pain medication. Discussed with Kristin Meadows the conversation the evening prior with Dr. Marko Plume.  Suggested that she go to the ED to be evaluated for pain control and be evaluated and get the CT Scan of abdomen to evaluate Increasing bilirubin. Patient agreed to to to [facility] ED.  Dr. Marko Plume notified.

## 2015-01-23 NOTE — ED Notes (Signed)
Hospitalist at bedside 

## 2015-01-23 NOTE — H&P (Signed)
PCP:  Ernestene Kiel, MD    Chief Complaint:  Nausea and vomiting. HPI: Kristin Meadows is a 60 y.o. female   has a past medical history of Hypertension; Left bundle branch block (LBBB) (age 27); Anxiety; GERD (gastroesophageal reflux disease); H/O hiatal hernia; Arthritis; Idiopathic neuropathy; Meningioma; Diabetes mellitus without complication; and Cancer.    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 She was on Gemzar chemo infusion but had to be held due to drop in platelates. She is transitioning to hospice care. Presenting with 3-4 days of worsening Jaundice with some Right upper quadrant pain. TB up form 2 to 12 today. She Had an US done yesterday 01/22/15 per her oncologist orders. It showed mild biliary dilatation, CT done today showed New intrahepatic biliary dilatation and high-grade right and left portal vein stenosis suspect peritoneal metastasis;  residual peritoneal disease in the pelvis, with extensive bowel tethering causing chronic high-grade small bowel obstruction. I spoke extensively with patient and family at this point they would like to concentrate on commfort measures only. She was already started on Fentanyl patch. States she prefers to avoid NG tube if possible, not interested in biliary drain placement. And speaking to Radiology unsure if would be helpful. Patient is a Merchandiser, retail herself. She would like palliative care consult in AM.    Hospitalist was called for admission for Hepatic biliary obstruction due to metastatic pancreatic vs ovarian cancer with cancer pain and acute on chronic SBO  Review of Systems:    Pertinent positives include:  abdominal pain, nausea, vomiting,  Constitutional:  No weight loss, night sweats, Fevers, chills, fatigue, weight loss  HEENT:  No headaches, Difficulty swallowing,Tooth/dental problems,Sore throat,  No sneezing,  itching, ear ache, nasal congestion, post nasal drip,  Cardio-vascular:  No chest pain, Orthopnea, PND, anasarca, dizziness, palpitations.no Bilateral lower extremity swelling  GI:  No heartburn, indigestion, diarrhea, change in bowel habits, loss of appetite, melena, blood in stool, hematemesis Resp:  no shortness of breath at rest. No dyspnea on exertion, No excess mucus, no productive cough, No non-productive cough, No coughing up of blood.No change in color of mucus.No wheezing. Skin:  no rash or lesions. No jaundice GU:  no dysuria, change in color of urine, no urgency or frequency. No straining to urinate.  No flank pain.  Musculoskeletal:  No joint pain or no joint swelling. No decreased range of motion. No back pain.  Psych:  No change in mood or affect. No depression or anxiety. No memory loss.  Neuro: no localizing neurological complaints, no tingling, no weakness, no double vision, no gait abnormality, no slurred speech, no confusion  Otherwise ROS are negative except for above, 10 systems were reviewed  Past Medical History: Past Medical History  Diagnosis Date  . Hypertension   . Left bundle branch block (LBBB) age 56  . Anxiety   . GERD (gastroesophageal reflux disease)   . H/O hiatal hernia     small  . Arthritis   . Idiopathic neuropathy     biochemical  . Meningioma     x2  . Diabetes mellitus without complication     type 2 metformin  . Cancer     ovarian   Past Surgical History  Procedure Laterality Date  . Abdominal hysterectomy      with left salpingo-oophorectomy, performed via laparotomy for edometriosis at age 67  . Inguinal hernia repair Bilateral 2008  .  Appendectomy  early 30's    ruptured for a month before surgery  . Knee surgery Bilateral 2006    x2 on right, x1 left  . Salpingoophorectomy Right 07/01/2014    Procedure: EXPLORATORY LAPAROTOMY/RIGHT SALPINGO OOPHORECTOMY, OMENTECTOMY, TUMOR DEBULKING;  Surgeon: Everitt Amber, MD;  Location: [facility]  [facility];  Service: Gynecology;  Laterality: Right;  . Abdominal surgery    . Esophagogastroduodenoscopy (egd) with propofol N/A 07/30/2014    Procedure: ESOPHAGOGASTRODUODENOSCOPY (EGD) WITH PROPOFOL;  Surgeon: Arta Silence, MD;  Location: [facility];  Service: Endoscopy;  Laterality: N/A;  . Eus N/A 07/30/2014    Procedure: ESOPHAGEAL ENDOSCOPIC ULTRASOUND (EUS) RADIAL;  Surgeon: Arta Silence, MD;  Location: [facility];  Service: Endoscopy;  Laterality: N/A;     Medications: Prior to Admission medications   Medication Sig Start Date End Date Taking? Authorizing Provider  amitriptyline (ELAVIL) 25 MG tablet Take 1 tablet (25 mg total) by mouth at bedtime. 12/01/14  Yes Lennis Marion Downer, MD  docusate sodium (COLACE) 100 MG capsule Take 200 mg by mouth 2 (two) times daily.   Yes Historical Provider, MD  fentaNYL (DURAGESIC - DOSED MCG/HR) 25 MCG/HR patch Place 2 patches on skin every 72 hours or as directed. 01/21/15  Yes Lennis Marion Downer, MD  ferrous fumarate (HEMOCYTE) 325 (106 FE) MG TABS tablet Take 1 tablet daily on an empty stomach with OJ or 500 mg Vitamin C tablet 10/10/14  Yes Lennis P Livesay, MD  filgrastim (NEUPOGEN) 300 MCG/ML injection Inject 300 mcg SQ daily x 2 days after chemotherapy or as directed. 12/11/14  Yes Lennis Marion Downer, MD  furosemide (LASIX) 20 MG tablet Take 20 mg by mouth daily as needed for edema (edema).    Yes Historical Provider, MD  glycerin adult 2 G SUPP Place 1 suppository rectally daily as needed for mild constipation or moderate constipation (constipation).    Yes Historical Provider, MD  HYDROmorphone (DILAUDID) 2 MG tablet Take 1-2 tablets (2-4 mg total) by mouth every 4 (four) hours as needed for severe pain. 01/15/15  Yes Lennis Marion Downer, MD  lidocaine-prilocaine (EMLA) cream Apply 1 application topically as needed. 08/26/14  Yes Lennis Marion Downer, MD  LORazepam (ATIVAN) 0.5 MG tablet Place 1-2  tablet under the tongue or swallow every 6 as needed for  nausea. 01/15/15  Yes Lennis Marion Downer, MD  magnesium citrate SOLN Take 0.5 Bottles by mouth daily as needed for mild constipation or severe constipation (constipation).    Yes Historical Provider, MD  metoCLOPramide (REGLAN) 10 MG tablet Take 1 tablet (10 mg total) by mouth 4 (four) times daily -  before meals and at bedtime. 01/15/15  Yes Lennis Marion Downer, MD  omeprazole (PRILOSEC) 20 MG capsule Take 20 mg by mouth daily.   Yes Historical Provider, MD  oxymorphone (OPANA ER) 30 MG 12 hr tablet Take 1 tablet (30 mg total) by mouth every 12 (twelve) hours. 12/25/14  Yes Lennis Marion Downer, MD  phenazopyridine (PYRIDIUM) 200 MG tablet Take 1 tablet (200 mg total) by mouth 3 (three) times daily. 12/15/14  Yes Quintella Reichert, MD  polyethylene glycol [facility] Floria Raveling) packet Take 17 g by mouth daily as needed for mild constipation (constipation).    Yes Historical Provider, MD  promethazine (PHENERGAN) 25 MG tablet Take 1 tablet (25 mg total) by mouth 3 (three) times daily as needed for nausea or vomiting. 01/15/15  Yes Lennis Marion Downer, MD  sodium phosphate (FLEET) enema Place 1 enema rectally daily as needed (  constipation). follow package directions   Yes Historical Provider, MD  baclofen (LIORESAL) 10 MG tablet Take 10 mg by mouth 3 (three) times daily as needed for muscle spasms.     Historical Provider, MD  fluticasone (FLONASE) 50 MCG/ACT nasal spray Place 1 spray into both nostrils daily as needed for allergies.     Historical Provider, MD  ondansetron (ZOFRAN ODT) 8 MG disintegrating tablet Take 1-2 tablets (8-16 mg total) by mouth every 12 (twelve) hours as needed for nausea or vomiting. Patient not taking: Reported on 01/23/2015 08/06/14   Gordy Levan, MD  oseltamivir (TAMIFLU) 75 MG capsule Take 1 capsule by mouth daily as directed for exposure to flu. Patient not taking: Reported on 12/15/2014 09/12/14   Gordy Levan, MD  tamsulosin (FLOMAX) 0.4 MG CAPS capsule Take 1 capsule (0.4 mg total) by  mouth every evening. Patient not taking: Reported on 01/23/2015 01/15/15   Gordy Levan, MD    Allergies:   Allergies  Allergen Reactions  . Fluzone [Flu Virus Vaccine] Anaphylaxis  . Penicillins     "Paralyzed me"  . Sulfa Antibiotics     Rash  . Tape Rash    Silk tape    Social History:  Ambulatory  Independently Lives at home  With family     reports that she has never smoked. She has never used smokeless tobacco. She reports that she does not drink alcohol or use illicit drugs.    Family History: family history includes Anesthesia problems in her cousin.    Physical Exam: Patient Vitals for the past 24 hrs:  BP Temp Temp src Pulse Resp SpO2  01/23/15 1429 136/85 mmHg 97.8 F (36.6 C) Oral (!) 121 18 94 %    1. General:  in No Acute distress 2. Psychological: Alert and Oriented 3. Head/ENT:  Dry Mucous Membranes                          Head Non traumatic, neck supple                          Normal Dentition 4. SKIN:  decreased Skin turgor,  Skin clean Dry and intact no rash, jaundice 5. Heart: Regular rate and rhythm no Murmur, Rub or gallop 6. Lungs: Clear to auscultation bilaterally, no wheezes or crackles   7. Abdomen: Soft, some tenderness right upper qudrant, some distention 8. Lower extremities: no clubbing, cyanosis,trace edema 9. Neurologically Grossly intact, moving all 4 extremities equally 10. MSK: Normal range of motion  body mass index is unknown because there is no weight on file.   Labs on Admission:   Results for orders placed or performed during the hospital encounter of 01/23/15 (from the past 24 hour(s))  CBC with Differential/Platelet     Status: Abnormal   Collection Time: 01/23/15  3:29 PM  Result Value Ref Range   WBC 5.5 4.0 - 10.5 K/uL   RBC 3.52 (L) 3.87 - 5.11 MIL/uL   Hemoglobin 11.0 (L) 12.0 - 15.0 g/dL   HCT 32.9 (L) 36.0 - 46.0 %   MCV 93.5 78.0 - 100.0 fL   MCH 31.3 26.0 - 34.0 pg   MCHC 33.4 30.0 - 36.0 g/dL    RDW 16.2 (H) 11.5 - 15.5 %   Platelets 285 150 - 400 K/uL   Neutrophils Relative % 53 43 - 77 %   Neutro Abs 2.9 1.7 -  7.7 K/uL   Lymphocytes Relative 33 12 - 46 %   Lymphs Abs 1.8 0.7 - 4.0 K/uL   Monocytes Relative 14 (H) 3 - 12 %   Monocytes Absolute 0.8 0.1 - 1.0 K/uL   Eosinophils Relative 0 0 - 5 %   Eosinophils Absolute 0.0 0.0 - 0.7 K/uL   Basophils Relative 0 0 - 1 %   Basophils Absolute 0.0 0.0 - 0.1 K/uL  Comprehensive metabolic panel     Status: Abnormal   Collection Time: 01/23/15  3:29 PM  Result Value Ref Range   Sodium 133 (L) 135 - 145 mmol/L   Potassium 3.6 3.5 - 5.1 mmol/L   Chloride 97 96 - 112 mmol/L   CO2 25 19 - 32 mmol/L   Glucose, Bld 121 (H) 70 - 99 mg/dL   BUN 10 6 - 23 mg/dL   Creatinine, Ser <0.30 (L) 0.50 - 1.10 mg/dL   Calcium 8.8 8.4 - 10.5 mg/dL   Total Protein 5.8 (L) 6.0 - 8.3 g/dL   Albumin 2.7 (L) 3.5 - 5.2 g/dL   AST 118 (H) 0 - 37 U/L   ALT 113 (H) 0 - 35 U/L   Alkaline Phosphatase 679 (H) 39 - 117 U/L   Total Bilirubin 11.9 (H) 0.3 - 1.2 mg/dL   GFR calc non Af Amer NOT CALCULATED >90 mL/min   GFR calc Af Amer NOT CALCULATED >90 mL/min   Anion gap 11 5 - 15  Lipase, blood     Status: None   Collection Time: 01/23/15  3:29 PM  Result Value Ref Range   Lipase 12 11 - 59 U/L  Protime-INR     Status: None   Collection Time: 01/23/15  3:29 PM  Result Value Ref Range   Prothrombin Time 14.2 11.6 - 15.2 seconds   INR 1.09 0.00 - 1.49  APTT     Status: None   Collection Time: 01/23/15  3:29 PM  Result Value Ref Range   aPTT 32 24 - 37 seconds    UA not obtained  No results found for: HGBA1C  CrCl cannot be calculated (Unknown ideal weight.).  BNP (last 3 results) No results for input(s): PROBNP in the last 8760 hours.  Other results:  I have pearsonaly reviewed this: ECG REPORT  Rate: 123  Rhythm: LBBB ST&T Change: unchanged   There were no vitals filed for this visit.   Cultures:    Component Value Date/Time    SDES URINE, CLEAN CATCH 12/15/2014 1539   SPECREQUEST NONE 12/15/2014 1539   CULT  12/15/2014 1539    Multiple bacterial morphotypes present, none predominant. Suggest appropriate recollection if clinically indicated. Performed at [place] 12/17/2014 FINAL 12/15/2014 1539     Radiological Exams on Admission: Dg Chest 2 View  01/23/2015   CLINICAL DATA:  Intermittent chest pain.  Nausea.  Jaundice.  EXAM: CHEST  2 VIEW  COMPARISON:  Chest CT of 11/24/2014. Chest radiograph of 06/25/2014.  FINDINGS: Right Port-A-Cath which terminates at the low SVC. Midline trachea. Normal heart size and mediastinal contours. Small left pleural effusion is new. No pneumothorax. Patchy left base airspace disease. Air-fluid levels within the stomach and small bowel, most consistent with obstruction. No free intraperitoneal air.  IMPRESSION: Small left pleural effusion with adjacent atelectasis or less likely infection.  Probable small bowel obstruction. This would be better evaluated on pending CT.   Electronically Signed   By: Adria Devon.D.  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 Patient referred to ED for jaundice, pancreas protocol performed due to history of pancreas involvement.  EXAM: CT ABDOMEN AND PELVIS WITH CONTRAST  TECHNIQUE: Multidetector CT imaging of the abdomen and pelvis was performed using the standard protocol following bolus administration of intravenous contrast.  CONTRAST:  70mL OMNIPAQUE IOHEXOL 300 MG/ML SOLN, 159mL OMNIPAQUE IOHEXOL 300 MG/ML SOLN  COMPARISON:  12/15/2014  FINDINGS: BODY WALL: No significant findings  LOWER CHEST: Increase, small to moderate left pleural effusion which is layering. There is gastroesophageal reflux.  ABDOMEN/PELVIS:  Liver: Stable hypervascular mass in segment 6 consistent with hemangioma.   Biliary: New intrahepatic biliary dilatation to the level of the hilum, likely peritoneal disease given the clinical circumstances. The same process significantly narrows the right portal vein and moderately narrows the proximal left portal vein. There is no portal vein thrombosis.  Pancreas: No obstructive process noted within the pancreas. There is hypo/delayed enhancement in the pancreatic tail/body (remaining portions) which has significantly progressed since MRI 06/26/2014, favoring scarring.  Spleen: Chronic splenic vein occlusion.  No abnormal enhancement.  Adrenals: Stable indeterminate thickening of the left gland to 11 mm.  Kidneys and ureters: No hydronephrosis or stone.  Bladder: Unremarkable.  Reproductive: Hysterectomy and oophorectomies.  Bowel: There is chronic tethering and distortion the bowel in the lower abdomen and pelvis, likely from residual peritoneal disease, although nonneoplastic scarring certainly contributes. This causes chronic small bowel obstruction with progressive small bowel dilatation compared to prior. The appendix is not clearly identified due to extensive distortion in the pelvis, but there is no focal right lower quadrant inflammation.  Retroperitoneum: No evidence for nodal disease.  Peritoneum: Increasing, now moderate volume ascites. As above, there is likely residual peritoneal disease in the pelvis. Peritoneal disease likely explains the liver hilum mass effect.  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  OSSEOUS: No acute abnormalities.  IMPRESSION: 1. New intrahepatic biliary dilatation and high-grade right and left portal vein stenosis. A discrete hepatic hilar mass is not visible, but suspect peritoneal metastasis given the clinical circumstances. 2. Unmeasurable but likely residual peritoneal disease in the pelvis, with extensive bowel tethering causing chronic high-grade small bowel obstruction. 3. Enlarging ascites and  left pleural effusion. 4. 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   Electronically Signed   By: Monte Fantasia M.D.   On: 01/23/2015 17:22   US Abdomen Limited  01/22/2015   CLINICAL DATA:  Elevated liver function studies.  Jaundice.  EXAM: US ABDOMEN LIMITED - RIGHT UPPER QUADRANT  COMPARISON:  Multiple prior CT scans and abdominal MRI 04/2014  FINDINGS: Gallbladder:  The gallbladder is full of echogenic material which is likely a combination of tumefactive sludge and gallstones. There is also gallbladder wall thickening. Some of this could be due to the surrounding fluid. Negative sonographic Murphy sign.  Common bile duct:  Diameter: 7.0 mm  Liver:  The liver contour is slightly irregular. No focal hepatic lesions. Mild central intrahepatic biliary dilatation. Perihepatic fluid is noted.  IMPRESSION: 1. The gallbladder is full of echogenic material likely a combination of tumefactive sludge and tiny gallstones. Mild gallbladder wall thickening is likely due to the surrounding fluid. 2. Mild biliary dilatation. No obvious common bile duct stone. ERCP or MRCP may be helpful for further evaluation. 3. No focal  hepatic lesions. 4. Persistent and slightly progressive perihepatic fluid.   Electronically Signed   By: Marijo Sanes M.D.   On: 01/22/2015 09:12    Chart has been reviewed  Assessment/Plan  59 yo F  with history of pancreatic versus ovarian cancer with metastasis to peritoneum with acute on chronic small bowel obstruction due to peritoneal tethering and right upper quadrant pain with biliary obstruction likely also secondary to kneel metastases resulting in elevated bilirubin being admitted for pain control and palliative care consult with goals of care being comfort care Present on Admission:  . Cancer associated pain - continue Duragesic patch, Dilaudid when necessary IV since patient cannot tolerate by mouth. Palliative care consult in a.m.  Marland Kitchen Hypertension - hold by mouth  medications just small bowel obstruction  . Pancreatic malignant neoplasm - discussed with oncology who is aware and will consult in the morning  . SBO (small bowel obstruction) - patient at this point would like to concentrate on comfort care measures. She would like to avoid NG tube. Give gentle IV fluids palliative care consult. . 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 Patient states that she is not interested in biliary drain placement. She could consider less invasive procedures but would like time to think about it. Discussed case with radiology report this point unsure if anything would be beneficial and defers to interventional radiology if patient would like an intervention for palliation   Prophylaxis: scd,   CODE STATUS:  DNR/DNI comfort care  Other plan as per orders.  I have spent a total of 65 min on this admission extra time was taken to discuss care with oncology and radiology  [facility] 01/23/2015, 6:41 PM  [facility]  Pager [phone]   after 2 AM please page floor coverage PA If 7AM-7PM, please contact the day team taking care of the patient  [email or web address]  Password [email or web address]

## 2015-01-23 NOTE — ED Notes (Signed)
Patient reports having intermittent central chest pain that began at 1100. Patient also c/o nausea. Patient called oncologist  And was told to come to the ED due to new onset of jaundice in the last 3 days.

## 2015-01-23 NOTE — ED Notes (Signed)
Bed: WA24 Expected date:  Expected time:  Means of arrival:  Comments: Triage 2 

## 2015-01-24 DIAGNOSIS — E119 Type 2 diabetes mellitus without complications: Secondary | ICD-10-CM

## 2015-01-24 DIAGNOSIS — C561 Malignant neoplasm of right ovary: Secondary | ICD-10-CM

## 2015-01-24 DIAGNOSIS — C252 Malignant neoplasm of tail of pancreas: Secondary | ICD-10-CM

## 2015-01-24 DIAGNOSIS — R109 Unspecified abdominal pain: Secondary | ICD-10-CM

## 2015-01-24 DIAGNOSIS — G620 Drug-induced polyneuropathy: Secondary | ICD-10-CM

## 2015-01-24 DIAGNOSIS — C569 Malignant neoplasm of unspecified ovary: Secondary | ICD-10-CM

## 2015-01-24 DIAGNOSIS — R1030 Lower abdominal pain, unspecified: Secondary | ICD-10-CM

## 2015-01-24 DIAGNOSIS — K5660 Unspecified intestinal obstruction: Secondary | ICD-10-CM

## 2015-01-24 LAB — COMPREHENSIVE METABOLIC PANEL
ALK PHOS: 625 U/L — AB (ref 39–117)
ALT: 92 U/L — ABNORMAL HIGH (ref 0–35)
ANION GAP: 9 (ref 5–15)
AST: 89 U/L — ABNORMAL HIGH (ref 0–37)
Albumin: 2.4 g/dL — ABNORMAL LOW (ref 3.5–5.2)
BUN: 10 mg/dL (ref 6–23)
CO2: 24 mmol/L (ref 19–32)
Calcium: 8.2 mg/dL — ABNORMAL LOW (ref 8.4–10.5)
Chloride: 99 mmol/L (ref 96–112)
Creatinine, Ser: <0.3 mg/dL — ABNORMAL LOW (ref 0.50–1.10)
Glucose, Bld: 112 mg/dL — ABNORMAL HIGH (ref 70–99)
POTASSIUM: 3.5 mmol/L (ref 3.5–5.1)
Sodium: 132 mmol/L — ABNORMAL LOW (ref 135–145)
TOTAL PROTEIN: 5.4 g/dL — AB (ref 6.0–8.3)
Total Bilirubin: 13.2 mg/dL — ABNORMAL HIGH (ref 0.3–1.2)

## 2015-01-24 LAB — CBC
HCT: 31.6 % — ABNORMAL LOW (ref 36.0–46.0)
Hemoglobin: 10.1 g/dL — ABNORMAL LOW (ref 12.0–15.0)
MCH: 30.1 pg (ref 26.0–34.0)
MCHC: 32 g/dL (ref 30.0–36.0)
MCV: 94 fL (ref 78.0–100.0)
Platelets: 281 10*3/uL (ref 150–400)
RBC: 3.36 MIL/uL — AB (ref 3.87–5.11)
RDW: 16.7 % — ABNORMAL HIGH (ref 11.5–15.5)
WBC: 5.3 10*3/uL (ref 4.0–10.5)

## 2015-01-24 MED ORDER — SODIUM CHLORIDE 0.9 % IV SOLN
50.0000 ug/h | INTRAVENOUS | Status: DC
  Administered 2015-01-24 – 2015-01-25 (×3): 25 ug/h via INTRAVENOUS
  Administered 2015-01-26 – 2015-01-27 (×2): 50 ug/h via INTRAVENOUS
  Filled 2015-01-24 (×10): qty 1

## 2015-01-24 MED ORDER — BIOTENE DRY MOUTH MT LIQD: 15.0000 mL | OROMUCOSAL | Status: DC | PRN

## 2015-01-24 MED ORDER — HYDROMORPHONE HCL 1 MG/ML IJ SOLN
1.0000 mg | INTRAMUSCULAR | Status: DC | PRN
  Administered 2015-01-24 – 2015-01-26 (×16): 3 mg via INTRAVENOUS
  Administered 2015-01-26: 1 mg via INTRAVENOUS
  Filled 2015-01-24 (×8): qty 3
  Filled 2015-01-24: qty 1
  Filled 2015-01-24 (×8): qty 3

## 2015-01-24 MED ORDER — PROMETHAZINE HCL 25 MG/ML IJ SOLN
6.2500 mg | INTRAMUSCULAR | Status: DC | PRN
  Administered 2015-01-24 – 2015-01-25 (×4): 6.25 mg via INTRAVENOUS
  Filled 2015-01-24 (×4): qty 1

## 2015-01-24 MED ORDER — PROMETHAZINE HCL 25 MG/ML IJ SOLN
12.5000 mg | INTRAMUSCULAR | Status: DC | PRN
  Administered 2015-01-24: 12.5 mg via INTRAVENOUS
  Filled 2015-01-24: qty 1

## 2015-01-24 NOTE — Progress Notes (Signed)
January 24, 2015, 9:57 AM  Hospital day 2 Antibiotics: none Chemotherapy: #2 gemzar 01-15-15  Appreciate notification of admission by hospitalist service on 01-23-15 for uncontrolled symptoms from metastatic cystadenocarcinoma involving ovary and pancreas. 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 Patient seen now with husband at bedside.    Outpatient physicians: Everitt Amber; Marko Plume, Lytle Butte, MD (PCP [facility], [place]); Ellouise Newer (gyn [place]); cardiology [place]; neurologist [place]; Jackquline Denmark (GI [place]); Joie Bimler (urologist [place]), Arta Silence   Subjective: More comfortable with interventions in hospital so far. Duragesic patch 50 mcg begun afternoon 01-22-15, and she feels that this is noticeable today. In addition, IV dilaudid 2 mg is holding ~ 1-1.5 hours, presently ordered 1-2 mg every 2 hours (order adjusted now). Pain is mostly upper abdomen, intermittent more severe pain LUQ. She vomited very small amount this AM, but is tolerating ice chips and would like sips of clear liquids if possible. Phenergan has been her best for nausea thru all of this illness. Last BM 2 days ago. She does not want NG, prefers to vomit if needed. She has been up to BR to void. She denies pruritis. Likes PAS "like massage". No bleeding. Slept a little last PM.  PAC in Anaphylaxis to flu vaccine previously DNR patient's request  ONCOLOGIC HISTORY Patient has history of cystitis 2 years ago which presented with suprapubic and back pain, resolved with macrobid x 3 months; CT then was not remarkable. She developed similar symptoms over several months recently and was seen by Dr Nila Nephew with finding of urinary retention. CT AP without contrast at [facility] 06-09-14 showed 9.5 x 14.8 x 10.5 cm complex cystic mass in central pelvis with uterus surgically absent, no adenopathy abdomen or pelvic sidewall, apparent thickening tail of pancreas and question of peripancreatic stranding, 2.5 x 1.8 cm low density structure adjacent to right atrium. She was seen by Dr Ellouise Newer on 06-11-14, with pelvic mass on exam, CA 125 152 and CEA 3.28 both on 06-11-14. She was seen by Dr Denman George on 06-16-14, at which time she reported 10 lb weight loss in 2 weeks, early satiety, increased constipation and pain; her exam revealed large smooth pelvic mass filling pelvis, adherent to vagina and in close approximation to rectum, minimally mobile. CA 19-9 from 06-16-14 was 224.7. She had MRI abdomen at [facility] on 06-26-14 with findings of abnormal rounded/ truncated appearance of distal pancreatic body/tail with peripancreatic fluid/ inflammatory changes new compared with scans of 2013, associated splenic vein thrombosis, small volume ascites, no suspicious adenopathy, and probable benign pericardial cyst. She had exploratory laparotomy with right salpingo-oophorectomy, omentectomy and radical debulking of ovarian cancer by Dr Denman George at [facility] on 07-01-14. Intraoperative findings were of 20 cm cystic mass arising from right ovary, filling pelvis centrally and adherent to sigmoid colon, 200 cc ascites, peritoneal tumor plaques anterior pelvis and right diaphragm with TNTC 1 cm nodules small bowel mesentery and bowel wall; enlarged pancreatic body was described as rigid and abnormally firm to palpation. At completion of surgery there was residual tumor at the mesentery of the small and large intestine, thin tumor plaques on the diaphragm and pelvic peritoneum, and gallbladder serosa and pouch of Randol Kern representing an incomplete and suboptimal cytoreduction.  Pathology [id number] from 07-01-14 showed 16 x  11 x 7 cm mucinous cystadenocarcinoma of right ovary with capsule intact, tumor  on surface of ovary and identical tumor involving omentum and peritoneal biopsies, no nodes evaluated, for pT3bpNX/ FIGO IIIB. This pathology was reviewed by 3 pathologists in [facility]. Patient was stable for DC home on 07-03-14. Within 24 hours after discharge she developed nausea, vomiting and abdominal pain, requiring readmission from 7-25 thru 07-15-14 for SBO and mild pancreatitis. She was managed conservatively in hospital, followed by Dr Denman George, did require NG decompression and support with TNA via PICC. Repeat CT AP was negative for high grade bowel obstruction or transition point; PICC was removed prior to DC home and diabetes was managed with SS insulin. Lovenox was continued in hospital but was not resumed at DC (initially planned x 4 weeks post op). She had endoscopic [place] with biopsy of mass in tail of pancreas by Dr Paulita Fujita on 07-30-14, path confirming mucinous cystadenocarcinoma involvement there. She began dose dense carboplatin taxol on 08-08-14, with improvement in symptoms and in CA 19-9 and CA 125 . By 10-31-14 CA 19-9 was down to 19.8 and CA 125 down to 33. CT CAP 11-24-14 showed decrease in lesion adjacent to right atrium, stranding at pancreatic tail region, question of bladder wall thickening, no ureteral obstruction, no AP adenopathy, small scattered ascites, some constipation. Markers began increasing in 11-2014, with more abdominal pain and CT AP 12-15-14 with evidence of progressive disease. She began gemzar 12-23-14. She had new elevation of LFTs including bilirubin of 2.73 on 01-15-15, gemzar given then at 50% dose. She had abdominal US on 01-22-15 with mild biliary dilatation and no focal obstruction seen; she had noticed new jaundice shortly prior to the [place]. Duragesic patch 50 mcg was begun on 01-22-15, as she was only intermittently able to keep down oral sustained release pain medication. She was admitted 01-23-15 with vomiting and uncontrolled pain.  Objective: Vital signs in last 24  hours: Blood pressure 145/81, pulse 111, temperature 98.7 F (37.1 C), temperature source Oral, resp. rate 18, height 5' 2.5" (1.588 m), weight 137 lb 2 oz (62.2 kg), SpO2 93 %.   Intake/Output from previous day: 02/12 0701 - 02/13 0700 In: 1154.2 [I.V.:1154.2] Out: -  Intake/Output this shift: Total I/O In: 0  Out: 225 [Urine:225]  Physical exam: awake, alert, looks comfortable lying on left side, respirations not labored RA. Does not appear more jaundiced than when I saw her briefly on 2-11. Lips dry. PAC site fine, infusing easily. Lungs without wheezes or rales. Heart RRR, no gallop. Abdomen mildly distended, not tight, not tender to gentle palpation, quiet. LE no pitting edema, cords, tenderness, PAS on. Feet warm. Repositions easily in bed. Speech fluent and appropriate.   Lab Results:  Recent Labs  01/23/15 1529 01/24/15 0500  WBC 5.5 5.3  HGB 11.0* 10.1*  HCT 32.9* 31.6*  PLT 285 281   BMET  Recent Labs  01/23/15 1529 01/24/15 0500  NA 133* 132*  K 3.6 3.5  CL 97 99  CO2 25 24  GLUCOSE 121* 112*  BUN 10 10  CREATININE <0.30* <0.30*  CALCIUM 8.8 8.2*    Studies/Results: Dg Chest 2 View  01/23/2015   CLINICAL DATA:  Intermittent chest pain.  Nausea.  Jaundice.  EXAM: CHEST  2 VIEW  COMPARISON:  Chest CT of 11/24/2014. Chest radiograph of 06/25/2014.  FINDINGS: Right Port-A-Cath which terminates at the low SVC. Midline trachea. Normal heart size and mediastinal contours. Small left pleural effusion is new.  No pneumothorax. Patchy left base airspace disease. Air-fluid levels within the stomach and small bowel, most consistent with obstruction. No free intraperitoneal air.  IMPRESSION: Small left pleural effusion with adjacent atelectasis or less likely infection.  Probable small bowel obstruction. This would be better evaluated on pending CT.   Electronically Signed   By: Abigail Miyamoto M.D.   On: 01/23/2015 17:26   Ct Abdomen Pelvis W Contrast  01/23/2015    CLINICAL DATA:  History of cystoadenocarcinoma of the right ovary and pancreatic tail with debulking and chemotherapy. Patient referred to ED for jaundice, pancreas protocol performed due to history of pancreas involvement.  EXAM: CT ABDOMEN AND PELVIS WITH CONTRAST  TECHNIQUE: Multidetector CT imaging of the abdomen and pelvis was performed using the standard protocol following bolus administration of intravenous contrast.  CONTRAST:  70mL OMNIPAQUE IOHEXOL 300 MG/ML SOLN, 146mL OMNIPAQUE IOHEXOL 300 MG/ML SOLN  COMPARISON:  12/15/2014  FINDINGS: BODY WALL: No significant findings  LOWER CHEST: Increase, small to moderate left pleural effusion which is layering. There is gastroesophageal reflux.  ABDOMEN/PELVIS:  Liver: Stable hypervascular mass in segment 6 consistent with hemangioma.  Biliary: New intrahepatic biliary dilatation to the level of the hilum, likely peritoneal disease given the clinical circumstances. The same process significantly narrows the right portal vein and moderately narrows the proximal left portal vein. There is no portal vein thrombosis.  Pancreas: No obstructive process noted within the pancreas. There is hypo/delayed enhancement in the pancreatic tail/body (remaining portions) which has significantly progressed since MRI 06/26/2014, favoring scarring.  Spleen: Chronic splenic vein occlusion.  No abnormal enhancement.  Adrenals: Stable indeterminate thickening of the left gland to 11 mm.  Kidneys and ureters: No hydronephrosis or stone.  Bladder: Unremarkable.  Reproductive: Hysterectomy and oophorectomies.  Bowel: There is chronic tethering and distortion the bowel in the lower abdomen and pelvis, likely from residual peritoneal disease, although nonneoplastic scarring certainly contributes. This causes chronic small bowel obstruction with progressive small bowel dilatation compared to prior. The appendix is not clearly identified due to extensive distortion in the pelvis, but there is  no focal right lower quadrant inflammation.  Retroperitoneum: No evidence for nodal disease.  Peritoneum: Increasing, now moderate volume ascites. As above, there is likely residual peritoneal disease in the pelvis. Peritoneal disease likely explains the liver hilum mass effect.  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  OSSEOUS: No acute abnormalities.  IMPRESSION: 1. New intrahepatic biliary dilatation and high-grade right and left portal vein stenosis. A discrete hepatic hilar mass is not visible, but suspect peritoneal metastasis given the clinical circumstances. 2. Unmeasurable but likely residual peritoneal disease in the pelvis, with extensive bowel tethering causing chronic high-grade small bowel obstruction. 3. Enlarging ascites and left pleural effusion. 4. 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   Electronically Signed   By: Monte Fantasia M.D.   On: 01/23/2015 17:22   CT images reviewed in [facility]    MEDICATIONS: reviewed in EMR. I have not used sandostatin for bowel obstruction, but patient is glad to try this as Dr Rockne Menghini has suggested. Reglan DCd with bowel findings, on protonix. I have changed dilaudid to 1-3 mg IV q 1 hour prn. Added biotene at patient's request.  DISCUSSION: reviewed CT information with patient and husband. They understand that goal of care in hospital is to control symptoms as best possible, tho resolving the bowel obstruction (clinically does not seem complete obstruction now) may not be possible. As  above, she does NOT want NG. She also does not want biliary drain, which was not recommended by IR per admission note in any event. She will likely need dilaudid via CAD pump at DC. She will not have further chemotherapy. They are in agreement with Hospice care at DC. 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 Note husband will probably need lots of support with her care at home.  Assessment/Plan: 1. 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 Gemzar given 12-23-14 and 01-15-15, discontinued now due to extensively progressive disease. Goal now is symptom management, code status DNR, Hospice involvement needed at DC. Likely will need IV dilaudid via pump/ PAC after DC, tho also ok to manage with duragesic patches and po dilaudid if that is possible.  2.bowel obstruction at least partial, with CT evidence of tethering of bowels. I think ok not to use NG. Trial of sandostatin per Dr Rockne Menghini 3.probably multifocal biliary obstruction: agree with no biliary drain 4.DNR patient's request 5.PAC in 6.idiopathic peripheral sensory neuropathy: progressed in feet with taxol. Elavil had been helpful, but DCd earlier this week with hepatic dysfunction 7.multiple drug allergies/ anaphylaxis to flu vaccine 8.post hysterectomy and remote left oophorectomy. History cystitis. HTN, LBBB. 9.history of diabetes, recently off hypoglycemic agents and blood sugars ok  I will see her again on 2-14. Dr Marin Olp on call for [place] this weekend if needed prior.  Thank you!  Icy Fuhrmann P Pager [phone]

## 2015-01-24 NOTE — Progress Notes (Signed)
Progress Note   Kristin Meadows XHB:[id number] DOB: [birth year] DOA: 01/23/2015 PCP: Ernestene Kiel, MD   Brief Narrative:   Kristin Meadows is an 59 y.o. 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 Marko Plume. A palliative care consultation has been requested for goals of care as patient has indicated that she wishes to transition toward comfort measures.  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  Palliative care consultation pending.  Active Problems:   Hypertension  Not currently on antihypertensives.    SBO (small bowel obstruction)  Discontinue Reglan and start Sandostatin.    Diabetes mellitus type 2 in nonobese  Nothing by mouth, not currently on therapy.    DVT Prophylaxis  SCDs ordered.  Code Status: DNR Family Communication: Multiple family updated at bedside. Disposition Plan: Home when pain and nausea/vomiting adequately managed, and discharge plan defined with Hospice support arranged.   IV Access:    Port-A-Cath   Procedures and diagnostic studies:   Dg Chest 2 View 01/23/2015: Small left pleural effusion with adjacent atelectasis or less likely infection.  Probable small bowel obstruction. This would be better evaluated on pending CT.     Ct Abdomen Pelvis W Contrast 01/23/2015: 1. New intrahepatic biliary dilatation and high-grade right and left portal vein stenosis. A discrete hepatic hilar mass is not visible, but suspect peritoneal metastasis given the clinical circumstances. 2.  Unmeasurable but likely residual peritoneal disease in the pelvis, with extensive bowel tethering causing chronic high-grade small bowel obstruction. 3. Enlarging ascites and left pleural effusion. 4. 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     Medical Consultants:    None.  Anti-Infectives:    None.  Subjective:   Kristin Meadows is still having episodes of severe crampy abdominal pain.  No flatus.  Last BM was 01/22/15.   Vomited this a.m. But wants to try CL diet.  Objective:    Filed Vitals:   01/23/15 1429 01/23/15 1925 01/23/15 2056 01/24/15 0613  BP: 136/85 150/81 136/75 145/81  Pulse: 121 117 116 111  Temp: 97.8 F (36.6 C) 98.8 F (37.1 C) 98.6 F (37 C) 98.7 F (37.1 C)  TempSrc: Oral Oral Oral Oral  Resp: 18 20 18 18   Height:   5' 2.5" (1.588 m)   Weight:   62.2 kg (137 lb 2 oz)   SpO2: 94% 93% 95% 93%    Intake/Output Summary (Last 24 hours) at 01/24/15 0906 Last data filed at 01/24/15 0600  Gross per 24 hour  Intake 1154.17 ml  Output      0 ml  Net 1154.17 ml    Exam: Gen:  NAD Cardiovascular:  Tachy/reg, No M/R/G Respiratory:  Lungs CTAB Gastrointestinal:  Abdomen distended, quiet Extremities:  1+ edema   Data Reviewed:    Labs: Basic Metabolic Panel:  Recent Labs Lab 01/23/15 1529 01/24/15 0500  NA 133* 132*  K 3.6 3.5  CL 97 99  CO2 25 24  GLUCOSE 121* 112*  BUN 10 10  CREATININE <0.30* <0.30*  CALCIUM 8.8 8.2*   GFR CrCl cannot be calculated (Patient has no serum creatinine result  on file.). Liver Function Tests:  Recent Labs Lab 01/23/15 1529 01/24/15 0500  AST 118* 89*  ALT 113* 92*  ALKPHOS 679* 625*  BILITOT 11.9* 13.2*  PROT 5.8* 5.4*  ALBUMIN 2.7* 2.4*    Recent Labs Lab 01/23/15 1529  LIPASE 12   Coagulation profile  Recent Labs Lab 01/23/15 1529  INR 1.09    CBC:  Recent Labs Lab 01/23/15 1529 01/24/15 0500  WBC 5.5 5.3  NEUTROABS 2.9  --   HGB 11.0* 10.1*  HCT 32.9*  31.6*  MCV 93.5 94.0  PLT 285 281   Microbiology No results found for this or any previous visit (from the past 240 hour(s)).   Medications:   . [START ON 01/25/2015] fentaNYL  50 mcg Transdermal Q72H  . metoCLOPramide (REGLAN) injection  5 mg Intravenous 3 times per day   Continuous Infusions: . sodium chloride 75 mL/hr at 01/23/15 2200    Time spent: 25 minutes.   LOS: 1 day   RAMA,CHRISTINA  [facility] Pager [phone]. If unable to reach me by pager, please call my cell phone at [phone].  *Please refer to [email or web address], password [email or web address] to get updated schedule on who will round on this patient, as hospitalists switch teams weekly. If 7PM-7AM, please contact night-coverage at [email or web address], password [email or web address] for any overnight needs.  01/24/2015, 9:06 AM

## 2015-01-25 MED ORDER — CETYLPYRIDINIUM CHLORIDE 0.05 % MT LIQD
7.0000 mL | Freq: Two times a day (BID) | OROMUCOSAL | Status: DC
  Administered 2015-01-25 – 2015-01-27 (×4): 7 mL via OROMUCOSAL

## 2015-01-25 MED ORDER — GLYCERIN (LAXATIVE) 2.1 G RE SUPP
1.0000 | Freq: Two times a day (BID) | RECTAL | Status: DC | PRN
  Administered 2015-01-26: 1 via RECTAL
  Filled 2015-01-25 (×3): qty 1

## 2015-01-25 MED ORDER — PROMETHAZINE HCL 25 MG/ML IJ SOLN
6.2500 mg | INTRAMUSCULAR | Status: DC | PRN
  Administered 2015-01-25 – 2015-01-26 (×8): 6.25 mg via INTRAVENOUS
  Filled 2015-01-25 (×7): qty 1

## 2015-01-25 MED ORDER — METOCLOPRAMIDE HCL 5 MG/ML IJ SOLN
5.0000 mg | Freq: Four times a day (QID) | INTRAMUSCULAR | Status: DC | PRN
  Administered 2015-01-25: 5 mg via INTRAVENOUS
  Filled 2015-01-25: qty 2

## 2015-01-25 NOTE — Progress Notes (Signed)
MEDICAL ONCOLOGY January 25, 2015, 6:05 PM  Hospital day 3 Antibiotics: none Chemotherapy: #2 gemzar 01-15-15 Day 2 sandostatin infusion  Outpatient physicians: Everitt Amber; Marko Plume, Vernell Morgans: Ernestene Kiel, MD (PCP [facility] [place]); Ellouise Newer (gyn [place]); cardiology Catano; neurologist Rufus; Jackquline Denmark (GI [place]); Joie Bimler (urologist ), Arta Silence    Patient seen, with husband and unit RN at bedside. EMR reviewed since I saw her yesterday Subjective: More comfortable with dilaudid IV pushes 3 mg, has used x6 since MN in addition to fentanyl at 50 mcg as patch. Pain is across abdomen, especially LUQ, but at times almost pain free after the dilaudid pushes. Intermittent nausea and vomiting x3 today, phenergan most helpful but ativan between those doses also helped. No flatus or BM. No itching. No SOB with activity in room. Slept a few hours last pm. Does not want NG.   ONCOLOGIC HISTORY Patient has history of cystitis 2 years ago which presented with suprapubic and back pain, resolved with macrobid x 3 months; CT then was not remarkable. She developed similar symptoms over several months recently and was seen by Dr Nila Nephew with finding of urinary retention. CT AP without contrast at [facility] 06-09-14 showed 9.5 x 14.8 x 10.5 cm complex cystic mass in central pelvis with uterus surgically absent, no adenopathy abdomen or pelvic sidewall, apparent thickening tail of pancreas and question of peripancreatic stranding, 2.5 x 1.8 cm low density structure adjacent to right atrium. She was seen by Dr Ellouise Newer on 06-11-14, with pelvic mass on exam, CA 125 152 and CEA 3.28 both on 06-11-14. She was seen by Dr Denman George on 06-16-14, at which time she reported 10 lb weight loss in 2 weeks, early satiety, increased constipation and pain; her exam revealed large smooth pelvic mass filling pelvis, adherent to vagina and in close approximation to  rectum, minimally mobile. CA 19-9 from 06-16-14 was 224.7. She had MRI abdomen at [facility] on 06-26-14 with findings of abnormal rounded/ truncated appearance of distal pancreatic body/tail with peripancreatic fluid/ inflammatory changes new compared with scans of 2013, associated splenic vein thrombosis, small volume ascites, no suspicious adenopathy, and probable benign pericardial cyst. She had exploratory laparotomy with right salpingo-oophorectomy, omentectomy and radical debulking of ovarian cancer by Dr Denman George at [facility] on 07-01-14. Intraoperative findings were of 20 cm cystic mass arising from right ovary, filling pelvis centrally and adherent to sigmoid colon, 200 cc ascites, peritoneal tumor plaques anterior pelvis and right diaphragm with TNTC 1 cm nodules small bowel mesentery and bowel wall; enlarged pancreatic body was described as rigid and abnormally firm to palpation. At completion of surgery there was residual tumor at the mesentery of the small and large intestine, thin tumor plaques on the diaphragm and pelvic peritoneum, and gallbladder serosa and pouch of Randol Kern representing an incomplete and suboptimal cytoreduction.  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 This pathology was reviewed by 3 pathologists in [facility] system. Patient was stable for DC home on 07-03-14. Within 24 hours after discharge she developed nausea, vomiting and abdominal pain, requiring readmission from 7-25 thru 07-15-14 for SBO and mild pancreatitis. She was managed conservatively in hospital, followed by Dr Denman George, did require NG decompression and support with TNA via PICC. Repeat CT AP was negative for high grade bowel obstruction  or transition point; PICC was removed prior to DC home and diabetes was  managed with SS insulin. Lovenox was continued in hospital but was not resumed at DC (initially planned x 4 weeks post op). She had endoscopic [place] with biopsy of mass in tail of pancreas by Dr Paulita Fujita on 07-30-14, path confirming mucinous cystadenocarcinoma involvement there. She began dose dense carboplatin taxol on 08-08-14, with improvement in symptoms and in CA 19-9 and CA 125 . By 10-31-14 CA 19-9 was down to 19.8 and CA 125 down to 33. CT CAP 11-24-14 showed decrease in lesion adjacent to right atrium, stranding at pancreatic tail region, question of bladder wall thickening, no ureteral obstruction, no AP adenopathy, small scattered ascites, some constipation. Markers began increasing in 11-2014, with more abdominal pain and CT AP 12-15-14 with evidence of progressive disease. She began gemzar 12-23-14. She had new elevation of LFTs including bilirubin of 2.73 on 01-15-15, gemzar given then at 50% dose. She had abdominal US on 01-22-15 with mild biliary dilatation and no focal obstruction seen; she had noticed new jaundice shortly prior to the [place]. Duragesic patch 50 mcg was begun on 01-22-15, as she was only intermittently able to keep down oral sustained release pain medication. She was admitted 01-23-15 with vomiting and uncontrolled pain.   Objective: Vital signs in last 24 hours: Blood pressure 137/86, pulse 99, temperature 99 F (37.2 C), temperature source Oral, resp. rate 18, height 5' 2.5" (1.588 m), weight 137 lb 2 oz (62.2 kg), SpO2 94 %.   Intake/Output from previous day: 02/13 0701 - 02/14 0700 In: 3029.6 [Meadows.O.:990; I.V.:2039.6] Out: 1725 [Urine:825; Emesis/NG output:900] Intake/Output this shift: Total I/O In: 820 [[place]eadows.O.:120; I.V.:700] Out: 1350 [Urine:250; Emesis/NG output:1100]  Physical exam: Markedly jaundiced, but otherwise looks more comfortable now, sitting in chair, has just had IV dilaudid. Respirations not labored RA. Oral mucosa a little dry without lesions. Lungs without wheezes  or rales. PAC site ok, infusing sandostatin with IVF Abdomen slightly distended, quiet, not tender to gentle exam. LE no pitting edema, cords, tenderness. Speech fluent and appropriate, moves all extremities easily.  Lab Results:  Recent Labs  01/23/15 1529 01/24/15 0500  WBC 5.5 5.3  HGB 11.0* 10.1*  HCT 32.9* 31.6*  PLT 285 281   BMET  Recent Labs  01/23/15 1529 01/24/15 0500  NA 133* 132*  K 3.6 3.5  CL 97 99  CO2 25 24  GLUCOSE 121* 112*  BUN 10 10  CREATININE <0.30* <0.30*  CALCIUM 8.8 8.2*    Studies/Results: No results found.   DISCUSSION: Likely bowel obstruction + narcotic ileus. Reglan held, not able to use laxatives. Prn glycerin suppository ok if she wants to try that, just to try to move something from rectum.  If no improvement with sandostatin infusion by AM, would consider DC this and asking pharmacist to convert from fentanyl patches to dilaudid infusion with prn dilaudid boluses, which can then be continued by Hospice if/ when she is able to be discharged. Would consider leaving fentanyl patches in place for first couple of hours of the dilaudid infusion until the qtt becomes effective.  Assessment/Plan: 1. 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 Gemzar given 12-23-14 and 01-15-15, discontinued now due to extensively progressive disease. Goal now is symptom management, code status DNR, Hospice involvement needed at DC. Pain control likely  will be best using dilaudid infusion with prn bolus by pump/ PAC, and would consider starting this in hospital possibly tomorrow, see above. 2.bowel obstruction at least partial, with CT evidence of tethering of bowels. I think ok not to use NG. Trial of sandostatin per Dr Rockne Menghini, tho if not helpful by AM would suggest DC this  so that [facility] can be used for dilaudid infusion 3.probably multifocal biliary obstruction: agree with no biliary drain 4.DNR patient's request 5.PAC in 6.idiopathic peripheral sensory neuropathy: progressed in feet with taxol. Elavil had been helpful, but DCd earlier this week with hepatic dysfunction 7.multiple drug allergies/ anaphylaxis to flu vaccine 8.post hysterectomy and remote left oophorectomy. History cystitis. HTN, LBBB. 9.history of diabetes, recently off hypoglycemic agents and blood sugars ok  Please page if needed between my rounds  Pager [phone] Kristin Meadows,Kristin Meadows

## 2015-01-25 NOTE — Progress Notes (Signed)
Progress Note   Kristin Meadows JDY:[id number] DOB: 18-Jan-1956 DOA: 01/23/2015 PCP: Ernestene Kiel, MD   Brief Narrative:   Kristin Meadows is an 59 y.o. 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 Marko Plume. A palliative care consultation has been requested for goals of care as patient has indicated that she wishes to transition toward comfort measures.  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  Palliative care consultation pending.  May benefit from a venting PEG for refractory nausea/vomiting.  Active Problems:   Hypertension  Not currently on antihypertensives.    SBO (small bowel obstruction)  Continue Sandostatin.    Diabetes mellitus type 2 in nonobese  Not currently on therapy.    DVT Prophylaxis  SCDs ordered.  Code Status: DNR Family Communication: Multiple family updated at bedside. Disposition Plan: Home when pain and nausea/vomiting adequately managed, and discharge plan defined with Hospice support arranged.   IV Access:    Port-A-Cath   Procedures and diagnostic studies:   Dg Chest 2 View 01/23/2015: Small left pleural effusion with adjacent atelectasis or less likely infection.  Probable small bowel obstruction. This would be better evaluated on pending CT.     Ct Abdomen Pelvis W Contrast 01/23/2015: 1. New intrahepatic biliary dilatation and high-grade right and left portal vein stenosis. A discrete hepatic hilar mass is not visible, but suspect peritoneal metastasis given the  clinical circumstances. 2. Unmeasurable but likely residual peritoneal disease in the pelvis, with extensive bowel tethering causing chronic high-grade small bowel obstruction. 3. Enlarging ascites and left pleural effusion. 4. 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     Medical Consultants:    Dr. Evlyn Clines, Oncology.  Anti-Infectives:    None.  Subjective:   Kristin Meadows is still having episodes of severe crampy abdominal pain and nausea/vomiting.  No flatus.  Last BM was 01/22/15.   Wants to try [place] diet for taste.  Objective:    Filed Vitals:   01/24/15 1457 01/24/15 2247 01/25/15 0204 01/25/15 0603  BP: 140/82 134/77 113/73 138/75  Pulse: 105 99 101 97  Temp: 98.7 F (37.1 C) 98.3 F (36.8 C) 98.1 F (36.7 C) 98.3 F (36.8 C)  TempSrc: Oral Oral Oral Oral  Resp: 18 18 18 18   Height:      Weight:      SpO2: 93% 93% 93% 95%    Intake/Output Summary (Last 24 hours) at 01/25/15 1898 Last data filed at 01/25/15 0600  Gross per 24 hour  Intake 3029.58 ml  Output   1725 ml  Net 1304.58 ml    Exam: Gen:  NAD, jaundiced Cardiovascular:  Tachy/reg, No M/R/G Respiratory:  Lungs CTAB Gastrointestinal:  Abdomen distended, quiet Extremities:  1-2+ edema   Data Reviewed:    Labs: Basic Metabolic Panel:  Recent Labs Lab 01/23/15 1529 01/24/15 0500  NA 133* 132*  K 3.6 3.5  CL 97 99  CO2 25 24  GLUCOSE 121* 112*  BUN 10 10  CREATININE <0.30* <0.30*  CALCIUM 8.8 8.2*   GFR CrCl cannot be calculated (Patient has no serum creatinine result on file.). Liver  Function Tests:  Recent Labs Lab 01/23/15 1529 01/24/15 0500  AST 118* 89*  ALT 113* 92*  ALKPHOS 679* 625*  BILITOT 11.9* 13.2*  PROT 5.8* 5.4*  ALBUMIN 2.7* 2.4*    Recent Labs Lab 01/23/15 1529  LIPASE 12   Coagulation profile  Recent Labs Lab 01/23/15 1529  INR 1.09    CBC:  Recent Labs Lab 01/23/15 1529 01/24/15 0500  WBC 5.5 5.3  NEUTROABS 2.9   --   HGB 11.0* 10.1*  HCT 32.9* 31.6*  MCV 93.5 94.0  PLT 285 281   Microbiology No results found for this or any previous visit (from the past 240 hour(s)).   Medications:   . fentaNYL  50 mcg Transdermal Q72H   Continuous Infusions: . sodium chloride 75 mL/hr at 01/24/15 2125  . octreotide  (SANDOSTATIN)    IV infusion 25 mcg/hr (01/25/15 0440)    Time spent: 25 minutes.   LOS: 2 days   RAMA,CHRISTINA  [facility] Pager [phone]. If unable to reach me by pager, please call my cell phone at [phone].  *Please refer to [email or web address], password [email or web address] to get updated schedule on who will round on this patient, as hospitalists switch teams weekly. If 7PM-7AM, please contact night-coverage at [email or web address], password [email or web address] for any overnight needs.  01/25/2015, 8:03 AM

## 2015-01-26 ENCOUNTER — Ambulatory Visit: Payer: Managed Care, Other (non HMO)

## 2015-01-26 ENCOUNTER — Telehealth: Payer: Self-pay

## 2015-01-26 ENCOUNTER — Ambulatory Visit: Payer: Managed Care, Other (non HMO) | Admitting: Oncology

## 2015-01-26 ENCOUNTER — Other Ambulatory Visit: Payer: Managed Care, Other (non HMO)

## 2015-01-26 DIAGNOSIS — Z515 Encounter for palliative care: Secondary | ICD-10-CM

## 2015-01-26 MED ORDER — LORAZEPAM 2 MG/ML IJ SOLN
0.5000 mg | Freq: Every day | INTRAMUSCULAR | Status: DC
  Administered 2015-01-26: 0.5 mg via INTRAVENOUS
  Filled 2015-01-26: qty 1

## 2015-01-26 MED ORDER — METOCLOPRAMIDE HCL 5 MG/ML IJ SOLN
5.0000 mg | Freq: Four times a day (QID) | INTRAMUSCULAR | Status: DC
  Administered 2015-01-26 – 2015-01-27 (×4): 5 mg via INTRAVENOUS
  Filled 2015-01-26 (×3): qty 1
  Filled 2015-01-26 (×2): qty 2
  Filled 2015-01-26: qty 1
  Filled 2015-01-26: qty 2

## 2015-01-26 MED ORDER — PROMETHAZINE HCL 25 MG/ML IJ SOLN
12.5000 mg | Freq: Two times a day (BID) | INTRAMUSCULAR | Status: DC
  Administered 2015-01-26 – 2015-01-27 (×2): 12.5 mg via INTRAVENOUS
  Filled 2015-01-26 (×4): qty 1

## 2015-01-26 MED ORDER — ONDANSETRON HCL 4 MG/2ML IJ SOLN
4.0000 mg | Freq: Four times a day (QID) | INTRAMUSCULAR | Status: DC
  Administered 2015-01-26 – 2015-01-27 (×3): 4 mg via INTRAVENOUS
  Filled 2015-01-26 (×3): qty 2

## 2015-01-26 MED ORDER — LORAZEPAM 2 MG/ML IJ SOLN
0.2500 mg | INTRAMUSCULAR | Status: DC | PRN
  Administered 2015-01-27: 0.25 mg via INTRAVENOUS
  Filled 2015-01-26: qty 1

## 2015-01-26 MED ORDER — SODIUM CHLORIDE 0.9 % IV SOLN
1.0000 mg/h | INTRAVENOUS | Status: DC
  Administered 2015-01-26 (×2): 1 mg/h via INTRAVENOUS
  Filled 2015-01-26: qty 10

## 2015-01-26 MED ORDER — FAMOTIDINE IN NACL 20-0.9 MG/50ML-% IV SOLN
20.0000 mg | Freq: Two times a day (BID) | INTRAVENOUS | Status: DC
  Administered 2015-01-26 – 2015-01-27 (×2): 20 mg via INTRAVENOUS
  Filled 2015-01-26 (×3): qty 50

## 2015-01-26 MED ORDER — PROMETHAZINE HCL 25 MG/ML IJ SOLN: 12.5000 mg | Freq: Four times a day (QID) | INTRAMUSCULAR | Status: DC | PRN

## 2015-01-26 MED ORDER — PROMETHAZINE HCL 25 MG/ML IJ SOLN
6.2500 mg | Freq: Four times a day (QID) | INTRAMUSCULAR | Status: DC | PRN
  Administered 2015-01-26 – 2015-01-27 (×3): 6.25 mg via INTRAVENOUS
  Filled 2015-01-26 (×3): qty 1

## 2015-01-26 MED ORDER — BOOST / RESOURCE BREEZE PO LIQD
1.0000 | Freq: Three times a day (TID) | ORAL | Status: DC
  Administered 2015-01-26: 1 via ORAL

## 2015-01-26 MED ORDER — HYDROMORPHONE BOLUS VIA INFUSION
1.0000 mg | INTRAVENOUS | Status: DC | PRN
  Administered 2015-01-27 (×2): 1 mg via INTRAVENOUS
  Filled 2015-01-26 (×3): qty 1

## 2015-01-26 NOTE — Care Management Note (Signed)
    Page 1 of 1   01/26/2015     10:22:32 AM CARE MANAGEMENT NOTE 01/26/2015  Patient:  Kristin Meadows, Kristin Meadows   Account Number:  [id number]  Date Initiated:  01/26/2015  Documentation initiated by:  Sunday Spillers  Subjective/Objective Assessment:   59 yo female admitted with jaundice with some RUQ pain, pmh ovarian cancer and pancreatic cancer. PTA lived at home with spouse.     Action/Plan:   Home when stable   Anticipated DC Date:  01/28/2015   Anticipated DC Plan:  [place]  CM consult      PAC Choice  HOSPICE   Choice offered to / List presented to:  C-1 Patient           [place] agency  [facility] [place]   Status of service:  In process, will continue to follow Medicare Important Message given?   (If response is "NO", the following Medicare IM given date fields will be blank) Date Medicare IM given:   Medicare IM given by:   Date Additional Medicare IM given:   Additional Medicare IM given by:    Discharge Disposition:    Per UR Regulation:  Reviewed for med. necessity/level of care/duration of stay  If discussed at [place] of Stay Meetings, dates discussed:    Comments:  01-26-15 [address] Spoke with patient and spouse at bedside. Patient wishes to d/c home with hospice when stable. Has chosen [facility]. Contacted them and faxed information, plan for home infusion. Awaiting Palliative Care to assist with pain control/infusions. Will continue to follow.

## 2015-01-26 NOTE — Consult Note (Addendum)
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  I met with Kristin Meadows and with her husband to discuss goals and symptom management. Her goal is to be at home, to be able to eat, to not have pain and nausea if possible. She is most fearful of not being able to eat food and this is a source of enjoyment for her. 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 I reviewed her scans and coags and she is an appropriate referral- some concern for her gastric varicies but hopeful IR can find an adequate window. I do believe a VENTING ONLY PEG could tremendously help her quality of life and allow her to eat food and also vent for distention which will overall decrease her nausea and pain as well. I discussed pros and cons and a wide range of options- she had resolved her self to just vomiting but this can be very challenging when her illness progresses and she gets more sedated- she would be high risk for aspiration and very difficult to control symptoms when she get very close to EOL. She may have the capacity for some quality with this intervention and is probably worth the risk and localized pain at the site.  Her prognosis is well within hospice criteria- likely <6 weeks depending on her nutritional status it may be much less.  1. Pain: Start Dilaudid infusion at 28m/hr her total daily dose of Dilaudid has been 27 mg-in addition to the fentanyl patch- for now I will leave the patch- may be synergistic with the dilaudid and she seems  to be much improved from a pain standpoint. She can go home with a CADD pump PCA with basal dose and bolus parameters-hospice can help manage.  2. Anxiety:  Significant source of suffering for her. Will give her scheduled dose of Ativan qhs and low dose q4 prn during the day-she has tolerated this well in the past. Ativan helps with her nausea as well. Transition to SUBLINGUAL intensol on discharge with hospice.  3. Nausea: Multifactorial from opiates, visceral distention, mechanical obstruction, toxins from liver failure and vagus irritation. Will probably need several drugs at least for now- hopeful decompression with venting PEG will help. She does not want steroids which may also help refractory nausea. Will schedule Zofran/Reglan and Phenergan q6 hours with prns for refractory symptoms. Home regimen may be oral if PEG is placed-otherwise we can use ODT scheduled zofran and liquid reglan and phenergan.   4. Malignant obstruction:  Palliative protocol is Octreotide, High dose PPI and H2 Blocker and Decadron to decrease gastric secretion/infammation-alone it may not be as effective and once PEG is hopefully placed this can be discontinued. Will increase Octreotide to 590m and start Famotidine IV q12. No steroids per her request.Requested IR see her for VENTING PEG placement.   May need another day or two to get things situated for discharge (PEG, Meds) but I am confident that we can improve things for her before she goes home.  Will follow-up  tomorrow.   Time: 70 minutes 4PM-5:10 Greater than 50%  of this time was spent counseling and coordinating care related to the above assessment and plan.  Lane Hacker, DO Palliative Medicine [phone]

## 2015-01-26 NOTE — Progress Notes (Signed)
INITIAL NUTRITION ASSESSMENT  DOCUMENTATION CODES Per approved criteria  -Not Applicable   INTERVENTION: - Resource Breeze po TID, each supplement provides 250 kcal and 9 grams of protein - Magic cup TID with meals, each supplement provides 290 kcal and 9 grams of protein - Oatmeal with meals per pt request.   NUTRITION DIAGNOSIS: Inadequate oral intake related to ovarian and pancreatic cancer as evidenced by wt loss.   Goal: Pt to meet >/= 90% of their estimated nutrition needs   Monitor:  Weight trend, po intake, acceptance of supplements, labs  Reason for Assessment: Malnutrition Screening Tool  59 y.o. female  Admitting Dx: Mucinous cystadenocarcinoma  ASSESSMENT: Patient has hx of ovarian cancer and pancreatic cancer(unsure which one is the primary) with metastasis to the peritoneum with bowel tethering. She was on Gemzar chemo infusion but had to be held due to drop in platelates. She is transitioning to hospice care. Presenting with 3-4 days of worsening Jaundice with some Right upper quadrant pain.  - Pt with wishes to transition to comfort care. Palliative care consult for GOC pending.  - Pt reports that her weight was ~210 lbs over a year ago when she was diagnosed with cancer. Her weight has been slowly and steadily declining.  - Current diet is full liquids. Pt is eating a little, but complains of nausea. She was asking for cream of wheat or oatmeal with trays. Spoke with kitchen who will send oatmeal.  - Agreed to try [employer] and [employer]. Pt does not like Ensure.  - Labs reviewed - Entire nutritional-focused physical exam not completed due to pt requesting comfort measures. Moderate wasting of temples and collar bone.   Height: Ht Readings from Last 1 Encounters:  01/23/15 5' 2.5" (1.588 m)    Weight: Wt Readings from Last 1 Encounters:  01/23/15 137 lb 2 oz (62.2 kg)    Ideal Body Weight: 51.3 kg  % Ideal Body Weight: 121%  Wt Readings from  Last 10 Encounters:  01/23/15 137 lb 2 oz (62.2 kg)  01/05/15 134 lb 12.8 oz (61.145 kg)  12/25/14 134 lb (60.782 kg)  12/16/14 135 lb 9.6 oz (61.508 kg)  12/01/14 138 lb 3.2 oz (62.687 kg)  11/14/14 141 lb 14.4 oz (64.365 kg)  10/24/14 140 lb 6.4 oz (63.685 kg)  10/10/14 146 lb 3.2 oz (66.316 kg)  09/26/14 140 lb 3.2 oz (63.594 kg)  09/12/14 150 lb 1.6 oz (68.085 kg)    BMI:  Body mass index is 24.67 kg/(m^2).  Estimated Nutritional Needs: Kcal: 1700-1900 Protein: 90-100 g Fluid: 1.7 L./day  Skin: Intact  Diet Order: Diet full liquid  EDUCATION NEEDS: -No education needs identified at this time   Intake/Output Summary (Last 24 hours) at 01/26/15 1418 Last data filed at 01/26/15 0600  Gross per 24 hour  Intake   1680 ml  Output   3520 ml  Net  -1840 ml    Last BM: prior to admission   Labs:   Recent Labs Lab 01/23/15 1529 01/24/15 0500  NA 133* 132*  K 3.6 3.5  CL 97 99  CO2 25 24  BUN 10 10  CREATININE <0.30* <0.30*  CALCIUM 8.8 8.2*  GLUCOSE 121* 112*    CBG (last 3)  No results for input(s): GLUCAP in the last 72 hours.  Scheduled Meds: . antiseptic oral rinse  7 mL Mouth Rinse BID  . fentaNYL  50 mcg Transdermal Q72H    Continuous Infusions: . sodium chloride 75  mL/hr at 01/26/15 1308  . octreotide  (SANDOSTATIN)    IV infusion 25 mcg/hr (01/25/15 2357)    Past Medical History  Diagnosis Date  . Hypertension   . Left bundle branch block (LBBB) age 5  . Anxiety   . GERD (gastroesophageal reflux disease)   . H/O hiatal hernia     small  . Arthritis   . Idiopathic neuropathy     biochemical  . Meningioma     x2  . Diabetes mellitus without complication     type 2 metformin  . Cancer     ovarian    Past Surgical History  Procedure Laterality Date  . Abdominal hysterectomy      with left salpingo-oophorectomy, performed via laparotomy for edometriosis at age 54  . Inguinal hernia repair Bilateral 2008  . Appendectomy  early  30's    ruptured for a month before surgery  . Knee surgery Bilateral 2006    x2 on right, x1 left  . Salpingoophorectomy Right 07/01/2014    Procedure: EXPLORATORY LAPAROTOMY/RIGHT SALPINGO OOPHORECTOMY, OMENTECTOMY, TUMOR DEBULKING;  Surgeon: Everitt Amber, MD;  Location: [facility];  Service: Gynecology;  Laterality: Right;  . Abdominal surgery    . Esophagogastroduodenoscopy (egd) with propofol N/A 07/30/2014    Procedure: ESOPHAGOGASTRODUODENOSCOPY (EGD) WITH PROPOFOL;  Surgeon: Arta Silence, MD;  Location: [facility];  Service: Endoscopy;  Laterality: N/A;  . Eus N/A 07/30/2014    Procedure: ESOPHAGEAL ENDOSCOPIC ULTRASOUND (EUS) RADIAL;  Surgeon: Arta Silence, MD;  Location: [facility];  Service: Endoscopy;  Laterality: N/A;    Laurette Schimke [place], [place], [place]

## 2015-01-26 NOTE — Progress Notes (Signed)
Medical Oncology  Patient and husband both sleeping now and I did not disturb. See my note last pm re possibly converting to dilaudid infusion instead of fentanyl patch. Please page if I can help, and I will try to see her later today.  Godfrey Pick, MD Pager [phone]

## 2015-01-26 NOTE — Progress Notes (Signed)
Progress Note   Kristin Meadows JIR:[id number] DOB: January 20, 1956 DOA: 01/23/2015 PCP: Ernestene Kiel, MD   Brief Narrative:   Kristin Meadows is an 59 y.o. 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 Marko Plume. A palliative care consultation has been requested for goals of care as patient has indicated that she wishes to transition toward comfort measures.  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  Palliative care consultation pending, needed for symptom control.  Dr. Marko Plume following.  Does not want a venting PEG for refractory nausea/vomiting.  Does not want NG tube.  Plan to d/c home with Hospice.  Active Problems:   Hypertension  Not currently on antihypertensives.    SBO (small bowel obstruction)  Continue Sandostatin.  Declines NG tube.    Diabetes mellitus type 2 in nonobese  Not currently on therapy.  Very little PO intake.    DVT Prophylaxis  SCDs ordered.  Code Status: DNR Family Communication: Husband updated at bedside. Disposition Plan: Home when pain and nausea/vomiting adequately managed, and discharge plan defined with Hospice support arranged.   IV Access:    Port-A-Cath   Procedures and diagnostic studies:   Dg Chest 2 View 01/23/2015: Small left pleural effusion with adjacent atelectasis or less likely infection.  Probable small bowel obstruction. This would be better evaluated on pending CT.     Ct Abdomen Pelvis W Contrast 01/23/2015: 1. New intrahepatic biliary dilatation  and high-grade right and left portal vein stenosis. A discrete hepatic hilar mass is not visible, but suspect peritoneal metastasis given the clinical circumstances. 2. Unmeasurable but likely residual peritoneal disease in the pelvis, with extensive bowel tethering causing chronic high-grade small bowel obstruction. 3. Enlarging ascites and left pleural effusion. 4. 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     Medical Consultants:    Dr. Evlyn Clines, Oncology.  Anti-Infectives:    None.  Subjective:   Kristin Meadows is still having episodes of severe crampy abdominal pain and vomiting.  No flatus.  Last BM was 01/22/15.  Pain and nausea controlled for the most part, but had several large volume bouts of emesis (non-bilious) yesterday and overnight.  Objective:    Filed Vitals:   01/25/15 0603 01/25/15 1313 01/25/15 2157 01/26/15 0510  BP: 138/75 137/86 133/75 105/59  Pulse: 97 99 98 89  Temp: 98.3 F (36.8 C) 99 F (37.2 C) 98 F (36.7 C) 98.2 F (36.8 C)  TempSrc: Oral Oral Oral Oral  Resp: 18 18 18 18   Height:      Weight:      SpO2: 95% 94% 94% 94%    Intake/Output Summary (Last 24 hours) at 01/26/15 0820 Last data filed at 01/26/15 0600  Gross per 24 hour  Intake   2500 ml  Output   3520 ml  Net  -1020 ml    Exam: Gen:  NAD, jaundiced Cardiovascular:  Tachy/reg, No M/R/G Respiratory:  Lungs CTAB Gastrointestinal:  Abdomen distended, quiet Extremities:  1-2+ edema   Data Reviewed:    Labs: Basic Metabolic Panel:  Recent Labs Lab 01/23/15 1529 01/24/15 0500  NA 133* 132*  K 3.6 3.5  CL 97 99  CO2 25 24  GLUCOSE 121* 112*  BUN 10 10  CREATININE <0.30* <0.30*  CALCIUM 8.8 8.2*   GFR CrCl cannot be calculated (Patient has no serum creatinine result on file.). Liver Function Tests:  Recent Labs Lab 01/23/15 1529 01/24/15 0500  AST 118* 89*  ALT 113* 92*  ALKPHOS 679* 625*  BILITOT 11.9* 13.2*  PROT 5.8* 5.4*  ALBUMIN  2.7* 2.4*    Recent Labs Lab 01/23/15 1529  LIPASE 12   Coagulation profile  Recent Labs Lab 01/23/15 1529  INR 1.09    CBC:  Recent Labs Lab 01/23/15 1529 01/24/15 0500  WBC 5.5 5.3  NEUTROABS 2.9  --   HGB 11.0* 10.1*  HCT 32.9* 31.6*  MCV 93.5 94.0  PLT 285 281   Microbiology No results found for this or any previous visit (from the past 240 hour(s)).   Medications:   . antiseptic oral rinse  7 mL Mouth Rinse BID  . fentaNYL  50 mcg Transdermal Q72H   Continuous Infusions: . sodium chloride 75 mL/hr at 01/25/15 2357  . octreotide  (SANDOSTATIN)    IV infusion 25 mcg/hr (01/25/15 2357)    Time spent: 25 minutes.   LOS: 3 days   [facility] Pager [phone]. If unable to reach me by pager, please call my cell phone at [phone].  *Please refer to [email or web address], password [email or web address] to get updated schedule on who will round on this patient, as hospitalists switch teams weekly. If 7PM-7AM, please contact night-coverage at [email or web address], password [email or web address] for any overnight needs.  01/26/2015, 8:20 AM

## 2015-01-26 NOTE — Telephone Encounter (Signed)
Kristin Meadows with [facility] of [facility] called. The pt will be discharged to hospice tomorrow. They were asking if Dr Marko Plume would be the attending with hospice comprehensive assistance.

## 2015-01-27 ENCOUNTER — Other Ambulatory Visit: Payer: Self-pay | Admitting: *Deleted

## 2015-01-27 MED ORDER — LIDOCAINE-PRILOCAINE 2.5-2.5 % EX CREA: 1.0000 "application " | TOPICAL_CREAM | CUTANEOUS | Status: AC | PRN

## 2015-01-27 MED ORDER — SODIUM CHLORIDE 0.9 % IJ SOLN
10.0000 mL | INTRAMUSCULAR | Status: DC | PRN
  Administered 2015-01-27: 10 mL

## 2015-01-27 MED ORDER — METOCLOPRAMIDE HCL 10 MG/10ML PO SOLN: 5.0000 mg | Freq: Three times a day (TID) | ORAL | Status: AC

## 2015-01-27 MED ORDER — FAMOTIDINE 40 MG/5ML PO SUSR: 20.0000 mg | Freq: Two times a day (BID) | ORAL | Status: AC

## 2015-01-27 MED ORDER — DISPOSABLE ENEMA 19-7 GM/118ML RE ENEM: 1.0000 | ENEMA | Freq: Every day | RECTAL | Status: AC | PRN

## 2015-01-27 MED ORDER — LORAZEPAM 0.5 MG PO TABS: ORAL_TABLET | ORAL | Status: AC

## 2015-01-27 MED ORDER — POLYETHYLENE GLYCOL 3350 17 G PO PACK: 17.0000 g | PACK | Freq: Every day | ORAL | Status: AC | PRN

## 2015-01-27 MED ORDER — TAMSULOSIN HCL 0.4 MG PO CAPS: 0.4000 mg | ORAL_CAPSULE | Freq: Every evening | ORAL | Status: AC

## 2015-01-27 MED ORDER — HYDROMORPHONE BOLUS VIA INFUSION: 1.0000 mg | INTRAVENOUS | Status: AC | PRN

## 2015-01-27 MED ORDER — ONDANSETRON 8 MG PO TBDP: 8.0000 mg | ORAL_TABLET | Freq: Three times a day (TID) | ORAL | Status: AC

## 2015-01-27 MED ORDER — FENTANYL 25 MCG/HR TD PT72: 50.0000 ug | MEDICATED_PATCH | TRANSDERMAL | Status: AC

## 2015-01-27 MED ORDER — PROMETHAZINE HCL 6.25 MG/5ML PO SYRP: ORAL_SOLUTION | ORAL | Status: AC

## 2015-01-27 MED ORDER — HEPARIN SOD (PORK) LOCK FLUSH 100 UNIT/ML IV SOLN
500.0000 [IU] | INTRAVENOUS | Status: AC | PRN
  Administered 2015-01-27: 500 [IU]

## 2015-01-27 MED ORDER — SODIUM CHLORIDE 0.9 % IV SOLN: INTRAVENOUS | Status: DC

## 2015-01-27 NOTE — Plan of Care (Signed)
Problem: Phase III Progression Outcomes Goal: IV/normal saline lock discontinued Outcome: Not Applicable Date Met:  10/93/23 PAC locked prior to dc.

## 2015-01-27 NOTE — Progress Notes (Signed)
IR PA aware of request for palliative image guided percutaneous venting gastrostomy tube placement. Images have been reviewed with Dr. Vernard Gambles today and given the gastric varices we would recommend GI consideration for placement endoscopically. The patient also has ascites which may make her a poor candidate overall.  Tsosie Billing PA-C Interventional Radiology  01/27/15  10:16 AM

## 2015-01-27 NOTE — Progress Notes (Signed)
Call placed to [facility] for Palliative Peg placement. Patient has seen Dr. Paulita Fujita in the past. Strong recommendation for venting PEG for symptom management- see consult note. Will not be a long term issue due to limited prognosis- would make a huge difference in QOL- she has malignant obstruction with refractory nausea and vomiting every few hours. Would be worth risk, localized pain and ascites leaking managed with drain sponges for her to be able to decompress her stomach- hopeful this can be done endoscopically.

## 2015-01-27 NOTE — Progress Notes (Signed)
Assessment unchanged. Pt and husband verbalized understanding of dc instructions through teach back. [facility] recently delivered CAD pump and supplies needed for Hospice care at home. Husband to call [facility] and notify nurse pt on the way home. Bolus of 1 mg of Dilaudid given prior to dc. IV team flushed and locked PAC after med given. Discharged via wc to front entrance to meet awaiting vehicle to carry home. Accompanied by NT and family.

## 2015-01-27 NOTE — Discharge Summary (Signed)
Physician Discharge Summary  Kristin Meadows VZD:[id number] DOB: May 07, 1956 DOA: 01/23/2015  PCP: Ernestene Kiel, MD  Admit date: 01/23/2015 Discharge date: 01/27/2015   Recommendations for Outpatient Follow-Up:   1. Patient is being discharged home with hospice care. Outpatient referral to IR for consideration of a venting PEG tube will be arranged.   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  Diet recommendation: Full liquids as tolerated.   History of Present Illness:   Kristin Meadows is an 59 y.o. 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 Marko Plume. A palliative care consultation has been requested for goals of care as patient has indicated that she wishes to transition toward comfort measures.  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  Palliative care following for assistance with symptom control. Will arrange for outpatient venting PEG tube if possible for ongoing symptom management for her malignant obstruction.  Hospice care arranged at discharge with placement of a pain pump for ongoing pain management.  Active Problems:  Hypertension  Not currently on  antihypertensives.   SBO (small bowel obstruction) / malignant obstruction  Treated with Sandostatin while in the hospital. Declines steroids. Continue PPI/H2 blocker.  Declined placement of a Meadows tube.   Outpatient referral to IR for placement of a venting PEG tube will be arranged by Dr. Hilma Favors.   Diabetes mellitus type 2 in nonobese  Not currently on therapy. Very little PO intake.    Medical Consultants:    Dr. Evlyn Clines, Oncology.  Dr. Rhea Pink, Palliative Care   Discharge Exam:   Filed Vitals:   01/27/15 0600  BP: 128/73  Pulse: 86  Temp: 98.2 F (36.8 C)  Resp: 16   Filed Vitals:   01/26/15 0510 01/26/15 1400 01/26/15 2200 01/27/15 0600  BP: 105/59 157/90 125/76 128/73  Pulse: 89 94 90 86  Temp: 98.2 F (36.8 C) 97.5 F (36.4 C) 98.2 F (36.8 C) 98.2 F (36.8 C)  TempSrc: Oral Oral Oral Oral  Resp: 18 18 16 16   Height:      Weight:      SpO2: 94% 93% 90% 94%    Gen: NAD, jaundiced Cardiovascular: Tachy/reg, No M/R/G Respiratory: Lungs CTAB Gastrointestinal: Abdomen distended, quiet Extremities: 1-2+ edema   The results of significant diagnostics from this hospitalization (including imaging, microbiology, ancillary and laboratory) are listed below for reference.     Procedures and Diagnostic Studies:   Dg Chest 2 View 01/23/2015: Small left pleural effusion with adjacent atelectasis or less likely infection. Probable small bowel obstruction. This would be better evaluated on pending CT.   Ct Abdomen Pelvis W Contrast 01/23/2015: 1. New intrahepatic biliary dilatation and high-grade right and left portal vein stenosis. A discrete hepatic hilar mass is not visible, but suspect peritoneal metastasis given the clinical circumstances.  2. Unmeasurable but likely residual peritoneal disease in the pelvis, with extensive bowel tethering causing chronic high-grade small bowel obstruction. 3. Enlarging ascites and left pleural effusion. 4.  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   Labs:   Basic Metabolic Panel:  Recent Labs Lab 01/23/15 1529 01/24/15 0500  NA 133* 132*  K 3.6 3.5  CL 97 99  CO2 25 24  GLUCOSE 121* 112*  BUN 10 10  CREATININE <0.30* <0.30*  CALCIUM 8.8 8.2*   GFR CrCl cannot be calculated (Patient has no serum creatinine result on file.). Liver Function Tests:  Recent Labs Lab 01/23/15 1529 01/24/15 0500  AST 118* 89*  ALT 113* 92*  ALKPHOS 679* 625*  BILITOT 11.9* 13.2*  PROT 5.8* 5.4*  ALBUMIN 2.7* 2.4*    Recent Labs Lab 01/23/15 1529  LIPASE 12   Coagulation profile  Recent Labs Lab 01/23/15 1529  INR 1.09    CBC:  Recent Labs Lab 01/23/15 1529 01/24/15 0500  WBC 5.5 5.3  NEUTROABS 2.9  --   HGB 11.0* 10.1*  HCT 32.9* 31.6*  MCV 93.5 94.0  PLT 285 281     Discharge Instructions:   Discharge Instructions    Call MD for:  persistant nausea and vomiting    Complete by:  As directed      Call MD for:  severe uncontrolled pain    Complete by:  As directed      Call MD for:  temperature >100.4    Complete by:  As directed      Diet general    Complete by:  As directed   Liquids as tolerated.     Discharge instructions    Complete by:  As directed   You were cared for by Dr. Jacquelynn Cree  (a hospitalist) during your hospital stay. If you have any questions about your discharge medications or the care you received while you were in the hospital after you are discharged, you can call the unit and ask to speak with the hospitalist on call if the hospitalist that took care of you is not available. Once you are discharged, your primary care physician will handle any further medical issues. Please note that NO REFILLS for any discharge medications will be authorized once you are discharged, as it is imperative that you return to your primary care physician (or establish a relationship with a primary care physician if you do not have one) for your  aftercare needs so that they can reassess your need for medications and monitor your lab values.  Any outstanding tests can be reviewed by your PCP at your follow up visit.  It is also important to review any medicine changes with your PCP.  Please bring these d/c instructions with you to your next visit so your physician can review these changes with you.  If you do not have a primary care physician, you can call [phone] for a physician referral.  It is highly recommended that you obtain a PCP for hospital follow up.     Increase activity slowly    Complete by:  As directed             Medication List    STOP taking these medications        baclofen 10 MG tablet  Commonly known as:  LIORESAL     docusate sodium 100 MG capsule  Commonly known as:  COLACE     ferrous fumarate 325 (106 FE) MG  Tabs tablet  Commonly known as:  HEMOCYTE     furosemide 20 MG tablet  Commonly known as:  LASIX     HYDROmorphone 2 MG tablet  Commonly known as:  DILAUDID  Replaced by:  HYDROmorphone 2 mg/mL Soln     magnesium citrate Soln     metoCLOPramide 10 MG tablet  Commonly known as:  REGLAN  Replaced by:  metoCLOPramide 10 MG/10ML Soln     oseltamivir 75 MG capsule  Commonly known as:  TAMIFLU     phenazopyridine 200 MG tablet  Commonly known as:  PYRIDIUM     promethazine 25 MG tablet  Commonly known as:  PHENERGAN  Replaced by:  promethazine 6.25 MG/5ML syrup      TAKE these medications        famotidine 40 MG/5ML suspension  Commonly known as:  PEPCID  Take 2.5 mLs (20 mg total) by mouth 2 (two) times daily.     fentaNYL 25 MCG/HR patch  Commonly known as:  DURAGESIC - dosed mcg/hr  Place 2 patches (50 mcg total) onto the skin every 3 (three) days. Place 2 patches on skin every 72 hours or as directed.     fluticasone 50 MCG/ACT nasal spray  Commonly known as:  FLONASE  Place 1 spray into both nostrils daily as needed for allergies.     glycerin adult 2 G Supp  Place 1  suppository rectally daily as needed for mild constipation or moderate constipation (constipation).     HYDROmorphone 100 mg in sodium chloride 0.9 % 40 mL  Dilaudid drip at 1 mg/hour via CADD pump per Hospice.  May bolus 1 mg from pump Q 30 minutes PRN.     HYDROmorphone 2 mg/mL Soln  Commonly known as:  DILAUDID  Inject 1 mg into the vein every hour as needed.     lidocaine-prilocaine cream  Commonly known as:  EMLA  Apply 1 application topically as needed.     LORazepam 0.5 MG tablet  Commonly known as:  ATIVAN  Place 1-2  tablet under the tongue or swallow every 6 as needed for nausea.     metoCLOPramide 10 MG/10ML Soln  Commonly known as:  REGLAN  Take 5 mLs (5 mg total) by mouth 3 (three) times daily before meals.     omeprazole 20 MG capsule  Commonly known as:  PRILOSEC  Take 20 mg by mouth daily.     ondansetron 8 MG disintegrating tablet  Commonly known as:  ZOFRAN ODT  Take 1 tablet (8 mg total) by mouth every 8 (eight) hours.     polyethylene glycol packet  Commonly known as:  MIRALAX / GLYCOLAX  Take 17 g by mouth daily as needed for mild constipation (constipation).     promethazine 6.25 MG/5ML syrup  Commonly known as:  PHENERGAN  Take 10 cc twice a day and every 6 hours as needed.     sodium phosphate enema  Commonly known as:  FLEET  Place 1 enema rectally daily as needed (constipation). follow package directions     tamsulosin 0.4 MG Caps capsule  Commonly known as:  FLOMAX  Take 1 capsule (0.4 mg total) by mouth every evening.          Time coordinating discharge: 40 minutes.  Signed:  RAMA,CHRISTINA  Pager [phone] [facility] 01/27/2015, 6:58 PM

## 2015-01-27 NOTE — Progress Notes (Signed)
Remaining medication in Dilaudid bolus bag equaled 15 ml and was wasted in sink. Waste witnessed and verified by Kelby Fam, RN.

## 2015-01-28 ENCOUNTER — Telehealth: Payer: Self-pay

## 2015-01-28 NOTE — Telephone Encounter (Signed)
Told Ginger that Dr. Marko Plume not in the office today to sign form for her to pick up.  Ginger will have theirHopice physician sign the form today.

## 2015-02-03 ENCOUNTER — Other Ambulatory Visit: Payer: Self-pay | Admitting: *Deleted

## 2015-02-03 DIAGNOSIS — C801 Malignant (primary) neoplasm, unspecified: Secondary | ICD-10-CM

## 2015-02-03 DIAGNOSIS — C259 Malignant neoplasm of pancreas, unspecified: Secondary | ICD-10-CM

## 2015-02-03 DIAGNOSIS — C561 Malignant neoplasm of right ovary: Secondary | ICD-10-CM

## 2015-02-03 MED ORDER — SODIUM CHLORIDE 0.9 % IV SOLN: INTRAVENOUS | Status: AC

## 2015-02-05 ENCOUNTER — Other Ambulatory Visit: Payer: Self-pay | Admitting: Radiology

## 2015-02-05 ENCOUNTER — Other Ambulatory Visit: Payer: Self-pay | Admitting: Internal Medicine

## 2015-02-05 DIAGNOSIS — R112 Nausea with vomiting, unspecified: Secondary | ICD-10-CM

## 2015-02-06 ENCOUNTER — Other Ambulatory Visit: Payer: Self-pay | Admitting: Radiology

## 2015-02-09 ENCOUNTER — Other Ambulatory Visit: Payer: Self-pay | Admitting: Internal Medicine

## 2015-02-09 ENCOUNTER — Encounter (HOSPITAL_COMMUNITY): Payer: Self-pay

## 2015-02-09 ENCOUNTER — Ambulatory Visit (HOSPITAL_COMMUNITY)
Admission: RE | Admit: 2015-02-09 | Discharge: 2015-02-09 | Disposition: A | Discharge status: Home / Self Care | Payer: 59 | Source: Ambulatory Visit | Attending: Diagnostic Radiology | Admitting: Diagnostic Radiology

## 2015-02-09 ENCOUNTER — Ambulatory Visit (HOSPITAL_COMMUNITY)
Admission: RE | Admit: 2015-02-09 | Discharge: 2015-02-09 | Disposition: A | Discharge status: Home / Self Care | Payer: 59 | Source: Ambulatory Visit | Attending: Internal Medicine | Admitting: Internal Medicine

## 2015-02-09 DIAGNOSIS — K5669 Other intestinal obstruction: Secondary | ICD-10-CM | POA: Insufficient documentation

## 2015-02-09 DIAGNOSIS — C252 Malignant neoplasm of tail of pancreas: Secondary | ICD-10-CM | POA: Insufficient documentation

## 2015-02-09 DIAGNOSIS — R188 Other ascites: Secondary | ICD-10-CM | POA: Diagnosis not present

## 2015-02-09 DIAGNOSIS — Z79899 Other long term (current) drug therapy: Secondary | ICD-10-CM | POA: Diagnosis not present

## 2015-02-09 DIAGNOSIS — E119 Type 2 diabetes mellitus without complications: Secondary | ICD-10-CM | POA: Insufficient documentation

## 2015-02-09 DIAGNOSIS — C561 Malignant neoplasm of right ovary: Secondary | ICD-10-CM | POA: Insufficient documentation

## 2015-02-09 DIAGNOSIS — I447 Left bundle-branch block, unspecified: Secondary | ICD-10-CM | POA: Diagnosis not present

## 2015-02-09 DIAGNOSIS — Z79891 Long term (current) use of opiate analgesic: Secondary | ICD-10-CM | POA: Insufficient documentation

## 2015-02-09 DIAGNOSIS — I10 Essential (primary) hypertension: Secondary | ICD-10-CM | POA: Diagnosis not present

## 2015-02-09 DIAGNOSIS — K56609 Unspecified intestinal obstruction, unspecified as to partial versus complete obstruction: Secondary | ICD-10-CM | POA: Insufficient documentation

## 2015-02-09 DIAGNOSIS — K219 Gastro-esophageal reflux disease without esophagitis: Secondary | ICD-10-CM | POA: Insufficient documentation

## 2015-02-09 DIAGNOSIS — F419 Anxiety disorder, unspecified: Secondary | ICD-10-CM | POA: Insufficient documentation

## 2015-02-09 DIAGNOSIS — Z90710 Acquired absence of both cervix and uterus: Secondary | ICD-10-CM | POA: Insufficient documentation

## 2015-02-09 DIAGNOSIS — R112 Nausea with vomiting, unspecified: Secondary | ICD-10-CM

## 2015-02-09 DIAGNOSIS — Z90721 Acquired absence of ovaries, unilateral: Secondary | ICD-10-CM | POA: Diagnosis not present

## 2015-02-09 DIAGNOSIS — K449 Diaphragmatic hernia without obstruction or gangrene: Secondary | ICD-10-CM | POA: Insufficient documentation

## 2015-02-09 LAB — CBC WITH DIFFERENTIAL/PLATELET
BASOS PCT: 0 % (ref 0–1)
Basophils Absolute: 0 10*3/uL (ref 0.0–0.1)
Eosinophils Absolute: 0 10*3/uL (ref 0.0–0.7)
Eosinophils Relative: 0 % (ref 0–5)
HCT: 30.9 % — ABNORMAL LOW (ref 36.0–46.0)
HEMOGLOBIN: 9.6 g/dL — AB (ref 12.0–15.0)
Lymphocytes Relative: 16 % (ref 12–46)
Lymphs Abs: 1.6 10*3/uL (ref 0.7–4.0)
MCH: 30.8 pg (ref 26.0–34.0)
MCHC: 31.1 g/dL (ref 30.0–36.0)
MCV: 99 fL (ref 78.0–100.0)
MONOS PCT: 10 % (ref 3–12)
Monocytes Absolute: 1 10*3/uL (ref 0.1–1.0)
NEUTROS ABS: 7.3 10*3/uL (ref 1.7–7.7)
Neutrophils Relative %: 74 % (ref 43–77)
Platelets: 342 10*3/uL (ref 150–400)
RBC: 3.12 MIL/uL — AB (ref 3.87–5.11)
RDW: 19.9 % — ABNORMAL HIGH (ref 11.5–15.5)
WBC: 9.9 10*3/uL (ref 4.0–10.5)

## 2015-02-09 LAB — GLUCOSE, CAPILLARY: Glucose-Capillary: 114 mg/dL — ABNORMAL HIGH (ref 70–99)

## 2015-02-09 MED ORDER — LIDOCAINE VISCOUS 2 % MT SOLN
OROMUCOSAL | Status: AC
  Filled 2015-02-09: qty 15

## 2015-02-09 MED ORDER — LIDOCAINE VISCOUS 2 % MT SOLN
15.0000 mL | Freq: Once | OROMUCOSAL | Status: AC
  Administered 2015-02-09: 15 mL via OROMUCOSAL

## 2015-02-09 MED ORDER — VANCOMYCIN HCL IN DEXTROSE 1-5 GM/200ML-% IV SOLN: 1000.0000 mg | Freq: Once | INTRAVENOUS | Status: DC

## 2015-02-09 MED ORDER — LIDOCAINE HCL 1 % IJ SOLN
INTRAMUSCULAR | Status: AC
  Filled 2015-02-09: qty 20

## 2015-02-09 MED ORDER — SODIUM CHLORIDE 0.9 % IV SOLN: INTRAVENOUS | Status: DC

## 2015-02-09 NOTE — Progress Notes (Signed)
PT and PTT cancelled as new change in plans. Patient is to get paracentesis and NG tube instead of G-tube placement.

## 2015-02-09 NOTE — Discharge Instructions (Signed)
Paracentesis °Paracentesis is a procedure used to remove excess fluid from the belly (abdomen). Excess fluid in the belly is called ascites. Excess fluid can be the result of certain conditions, such as infection, inflammation, abdominal injury, heart failure, chronic scarring of the liver (cirrhosis), or cancer. The excess fluid is removed using a needle inserted through the skin and tissue into the abdomen.  °A paracentesis may be done to: °· Determine the cause of the excess fluid through examination of the fluid. °· Relieve symptoms of shortness of breath or pain caused by the excess fluid. °· Determine presence of bleeding after an abdominal injury. °LET YOUR CAREGIVERS KNOW ABOUT: °· Allergies. °· Medications taken including herbs, eye drops, over-the-counter medications, and creams. °· Use of steroids (by mouth or creams). °· Previous problems with anesthetics or numbing medicine. °· Possibility of pregnancy, if this applies. °· History of blood clots (thrombophlebitis). °· History of bleeding or blood problems. °· Previous surgery. °· Other health problems. °RISKS AND COMPLICATIONS °· Injury to an abdominal organ, such as the bowel (large intestine), liver, spleen, or bladder. °· Possible infection. °· Bleeding. °· Low blood pressure (hypotension). °BEFORE THE PROCEDURE °This is a procedure that can be done as an outpatient. Confirm the time that you need to arrive for your procedure. A blood sample may be done to determine your blood clotting time. The presence of a severe bleeding disorder (coagulopathy) which cannot be promptly corrected may make this procedure inadvisable. You may be asked to urinate. °PROCEDURE °The procedure will take about 30 minutes. This time will vary depending on the amount of fluid that is removed. You may be asked to lie on your back with your head elevated. An area on your abdomen will be cleansed. A numbing medicine may then be injected (local anesthesia) into the skin and  tissue. A needle is inserted through your abdominal skin and tissues until it is positioned in your abdomen. You may feel pressure or slight pain as the needle is positioned into the abdomen. Fluid is removed from the abdomen through the needle. Tell your caregiver if you feel dizzy or lightheaded. The needle is withdrawn once the desired amount of fluid has been removed. A sample of the fluid may be sent for examination.  °AFTER THE PROCEDURE °Your recovery will be assessed and monitored. If there are no problems, as an outpatient, you should be able to go home shortly after the procedure. There may be a very limited amount of clear fluid draining from the needle insertion site over the next 2 days. Confirm with your caregiver as to the expected amount of drainage. °Obtaining the Test Results °It is your responsibility to obtain your test results. Do not assume everything is normal if you have not heard from your caregiver or the medical facility. It is important for you to follow up on all of your test results. °HOME CARE INSTRUCTIONS  °· You may resume normal diet and activities as directed or allowed. °· Only take over-the-counter or prescription medicines for pain, discomfort, or fever as directed by your caregiver. °SEEK IMMEDIATE MEDICAL CARE IF: °· You develop shortness of breath or chest pain. °· You develop increasing pain, discomfort, or swelling in your abdomen. °· You develop new drainage or pus coming from site where fluid was removed. °· You develop swelling or increased redness from site where fluid was removed. °· You develop an unexplained temperature of 102° F (38.9° C) or above. °Document Released: 06/13/2005 Document Revised: 02/20/2012 Document   Reviewed: 07/20/2009 °[employer]® Patient Information ©2015 [employer]. This information is not intended to replace advice given to you by your health care provider. Make sure you discuss any questions you have with your health care provider. ° °

## 2015-02-09 NOTE — H&P (Signed)
Chief Complaint: Nausea/vomiting.   Referring Physician(s): Golding,Elizabeth L  History of Present Illness: Kristin Meadows is a 58 y.o. 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 She denies any BM in over 3 weeks and is not passing gas. The patient also has ascites and gastric varices. She has been seen by Palliative and request for image guided percutaneous venting gastrostomy tube. The patient and family would like the venting G-tube so that she can eat solids without vomiting. The patient last had a NGT in 07/2014 with some relief they think, but the overall experience was traumatic so it is hard to tell. Per family the patient has rapidly declined over the past week.   Past Medical History  Diagnosis Date  . Hypertension   . Left bundle branch block (LBBB) age 65  . Anxiety   . GERD (gastroesophageal reflux disease)   . H/O hiatal hernia     small  . Arthritis   . Idiopathic neuropathy     biochemical  . Meningioma     x2  . Diabetes mellitus without complication     type 2 metformin  . Cancer     ovarian    Past Surgical History  Procedure Laterality Date  . Abdominal hysterectomy      with left salpingo-oophorectomy, performed via laparotomy for edometriosis at age 57  . Inguinal hernia repair Bilateral 2008  . Appendectomy  early 30's    ruptured for a month before surgery  . Knee surgery Bilateral 2006    x2 on right, x1 left  . Salpingoophorectomy Right 07/01/2014    Procedure: EXPLORATORY LAPAROTOMY/RIGHT SALPINGO OOPHORECTOMY, OMENTECTOMY, TUMOR DEBULKING;  Surgeon: Everitt Amber, MD;  Location: [facility];  Service: Gynecology;  Laterality: Right;  . Abdominal surgery    . Esophagogastroduodenoscopy (egd) with propofol N/A 07/30/2014    Procedure: ESOPHAGOGASTRODUODENOSCOPY (EGD) WITH PROPOFOL;  Surgeon: Arta Silence, MD;  Location: [facility];  Service:  Endoscopy;  Laterality: N/A;  . Eus N/A 07/30/2014    Procedure: ESOPHAGEAL ENDOSCOPIC ULTRASOUND (EUS) RADIAL;  Surgeon: Arta Silence, MD;  Location: [facility];  Service: Endoscopy;  Laterality: N/A;    Allergies: Fluzone; Penicillins; Sulfa antibiotics; and Tape  Medications: Prior to Admission medications   Medication Sig Start Date End Date Taking? Authorizing Provider  calcium carbonate (TUMS EX) 750 MG chewable tablet Chew 1 tablet by mouth as needed for heartburn.   Yes Historical Provider, MD  famotidine (PEPCID) 40 MG/5ML suspension Take 2.5 mLs (20 mg total) by mouth 2 (two) times daily. 01/27/15  Yes Venetia Maxon Rama, MD  fentaNYL (DURAGESIC - DOSED MCG/HR) 25 MCG/HR patch Place 2 patches (50 mcg total) onto the skin every 3 (three) days. Place 2 patches on skin every 72 hours or as directed. 01/27/15  Yes Christina P Rama, MD  fluticasone (FLONASE) 50 MCG/ACT nasal spray Place 1 spray into both nostrils daily as needed for allergies.    Yes Historical Provider, MD  glycerin adult 2 G SUPP Place 1 suppository rectally daily as needed for mild constipation or moderate constipation (constipation).    Yes Historical Provider, MD  metoCLOPramide (REGLAN) 10 MG/10ML SOLN Take 5 mLs (5 mg total) by mouth 3 (three) times daily before meals. 01/27/15  Yes Christina P Rama, MD  ondansetron (ZOFRAN ODT) 8 MG disintegrating tablet Take 1 tablet (8 mg total) by mouth every 8 (eight) hours. 01/27/15  Yes Christina  P Rama, MD  promethazine (PHENERGAN) 6.25 MG/5ML syrup Take 10 cc twice a day and every 6 hours as needed. Patient taking differently: Take 10 cc twice a day and every 6 hours as needed for nausea or vomitting. 01/27/15  Yes Venetia Maxon Rama, MD  Propylene Glycol (SYSTANE BALANCE OP) Apply 1 drop to eye 4 (four) times daily as needed (drye eyes).   Yes Historical Provider, MD  tamsulosin (FLOMAX) 0.4 MG CAPS capsule Take 1 capsule (0.4 mg total) by mouth every evening. 01/27/15  Yes  Christina P Rama, MD  HYDROmorphone (DILAUDID) 2 mg/mL SOLN Inject 1 mg into the vein every hour as needed. Patient taking differently: Inject 1 mg into the vein every hour as needed (pain).  01/27/15   Venetia Maxon Rama, MD  HYDROmorphone 100 mg in sodium chloride 0.9 % 40 mL Dilaudid drip at 1 mg/hour via CADD pump per Hospice.  May bolus 1 mg from pump Q 30 minutes PRN. 02/03/15   Lennis Marion Downer, MD  lidocaine-prilocaine (EMLA) cream Apply 1 application topically as needed. 01/27/15   Venetia Maxon Rama, MD  LORazepam (ATIVAN) 0.5 MG tablet Place 1-2  tablet under the tongue or swallow every 6 as needed for nausea. 01/27/15   Venetia Maxon Rama, MD  polyethylene glycol (MIRALAX / GLYCOLAX) packet Take 17 g by mouth daily as needed for mild constipation (constipation). Patient not taking: Reported on 02/06/2015 01/27/15   Venetia Maxon Rama, MD  sodium phosphate (FLEET) enema Place 1 enema rectally daily as needed (constipation). follow package directions Patient not taking: Reported on 02/06/2015 01/27/15   Venetia Maxon Rama, MD     Family History  Problem Relation Age of Onset  . Anesthesia problems Cousin     colon  . Diabetes type II Mother   . Lung cancer Father   . Diabetes type II Sister   . Diabetes type II Brother     History   Social History  . Marital Status: Married    Spouse Name: N/A  . Number of Children: N/A  . Years of Education: N/A   Social History Main Topics  . Smoking status: Never Smoker   . Smokeless tobacco: Never Used  . Alcohol Use: No  . Drug Use: No  . Sexual Activity: Yes   Other Topics Concern  . None   Social History Narrative   Review of Systems: A 12 point ROS discussed and pertinent positives are indicated in the HPI above.  All other systems are negative.  Review of Systems  Vital Signs: BP 102/64 mmHg  Pulse 102  Temp(Src) 97.8 F (36.6 C) (Oral)  Resp 16  SpO2 93%  Physical Exam General: Jaundice, frail, family at bedside Heart:  Tachycardic without M/G/R Chest: Right sided port intact  Lungs: CTA B/L Abd: Distended  Imaging: Dg Chest 2 View  01/23/2015   CLINICAL DATA:  Intermittent chest pain.  Nausea.  Jaundice.  EXAM: CHEST  2 VIEW  COMPARISON:  Chest CT of 11/24/2014. Chest radiograph of 06/25/2014.  FINDINGS: Right Port-A-Cath which terminates at the low SVC. Midline trachea. Normal heart size and mediastinal contours. Small left pleural effusion is new. No pneumothorax. Patchy left base airspace disease. Air-fluid levels within the stomach and small bowel, most consistent with obstruction. No free intraperitoneal air.  IMPRESSION: Small left pleural effusion with adjacent atelectasis or less likely infection.  Probable small bowel obstruction. This would be better evaluated on pending CT.   Electronically Signed   By:  Abigail Miyamoto M.D.   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 Patient referred to ED for jaundice, pancreas protocol performed due to history of pancreas involvement.  EXAM: CT ABDOMEN AND PELVIS WITH CONTRAST  TECHNIQUE: Multidetector CT imaging of the abdomen and pelvis was performed using the standard protocol following bolus administration of intravenous contrast.  CONTRAST:  54mL OMNIPAQUE IOHEXOL 300 MG/ML SOLN, 192mL OMNIPAQUE IOHEXOL 300 MG/ML SOLN  COMPARISON:  12/15/2014  FINDINGS: BODY WALL: No significant findings  LOWER CHEST: Increase, small to moderate left pleural effusion which is layering. There is gastroesophageal reflux.  ABDOMEN/PELVIS:  Liver: Stable hypervascular mass in segment 6 consistent with hemangioma.  Biliary: New intrahepatic biliary dilatation to the level of the hilum, likely peritoneal disease given the clinical circumstances. The same process significantly narrows the right portal vein and moderately narrows the proximal left portal  vein. There is no portal vein thrombosis.  Pancreas: No obstructive process noted within the pancreas. There is hypo/delayed enhancement in the pancreatic tail/body (remaining portions) which has significantly progressed since MRI 06/26/2014, favoring scarring.  Spleen: Chronic splenic vein occlusion.  No abnormal enhancement.  Adrenals: Stable indeterminate thickening of the left gland to 11 mm.  Kidneys and ureters: No hydronephrosis or stone.  Bladder: Unremarkable.  Reproductive: Hysterectomy and oophorectomies.  Bowel: There is chronic tethering and distortion the bowel in the lower abdomen and pelvis, likely from residual peritoneal disease, although nonneoplastic scarring certainly contributes. This causes chronic small bowel obstruction with progressive small bowel dilatation compared to prior. The appendix is not clearly identified due to extensive distortion in the pelvis, but there is no focal right lower quadrant inflammation.  Retroperitoneum: No evidence for nodal disease.  Peritoneum: Increasing, now moderate volume ascites. As above, there is likely residual peritoneal disease in the pelvis. Peritoneal disease likely explains the liver hilum mass effect.  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  OSSEOUS: No acute abnormalities.  IMPRESSION: 1. New intrahepatic biliary dilatation and high-grade right and left portal vein stenosis. A discrete hepatic hilar mass is not visible, but suspect peritoneal metastasis given the clinical circumstances. 2. Unmeasurable but likely residual peritoneal disease in the pelvis, with extensive bowel tethering causing chronic high-grade small bowel obstruction. 3. Enlarging ascites and left pleural effusion. 4. 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   Electronically Signed   By: Monte Fantasia M.D.   On: 01/23/2015 17:22   US Abdomen Limited  01/22/2015   CLINICAL DATA:  Elevated liver function  studies.  Jaundice.  EXAM: US ABDOMEN LIMITED - RIGHT UPPER QUADRANT  COMPARISON:  Multiple prior CT scans and abdominal MRI 04/2014  FINDINGS: Gallbladder:  The gallbladder is full of echogenic material which is likely a combination of tumefactive sludge and gallstones. There is also gallbladder wall thickening. Some of this could be due to the surrounding fluid. Negative sonographic Murphy sign.  Common bile duct:  Diameter: 7.0 mm  Liver:  The liver contour is slightly irregular. No focal hepatic lesions. Mild central intrahepatic biliary dilatation. Perihepatic fluid is noted.  IMPRESSION: 1. The gallbladder is full of echogenic material likely a combination of tumefactive sludge and tiny gallstones. Mild gallbladder wall thickening is likely due to the surrounding fluid. 2. Mild biliary dilatation. No obvious common bile duct stone. ERCP or MRCP may be helpful  for further evaluation. 3. No focal hepatic lesions. 4. Persistent and slightly progressive perihepatic fluid.   Electronically Signed   By: Marijo Sanes M.D.   On: 01/22/2015 09:12    Labs:  CBC:  Recent Labs  01/15/15 0900 01/23/15 1529 01/24/15 0500 02/09/15 0735  WBC 5.0 5.5 5.3 9.9  HGB 11.3* 11.0* 10.1* 9.6*  HCT 34.6* 32.9* 31.6* 30.9*  PLT 329 285 281 342    COAGS:  Recent Labs  08/25/14 1315 01/23/15 1529  INR 0.99 1.09  APTT  --  32    BMP:  Recent Labs  07/14/14 0510 07/21/14 1504  12/15/14 1558  01/05/15 1136 01/15/15 0901 01/23/15 1529 01/24/15 0500  NA 134* 136  < > 135  < > 136 139 133* 132*  K 4.0 4.5  < > 4.0  < > 3.6 3.3* 3.6 3.5  CL 98 95*  --  100  --   --   --  97 99  CO2 26 28  < > 27  < > 28 28 25 24   GLUCOSE 255* 200*  < > 159*  < > 123 157* 121* 112*  BUN 11 15  < > 12  < > 9.7 9.5 10 10   CALCIUM 8.7 9.7  < > 9.7  < > 9.3 8.8 8.8 8.2*  CREATININE 0.49* 0.58  < > 0.54  < > 0.6 0.6 <0.30* <0.30*  GFRNONAA >90  --   --  >90  --   --   --  NOT CALCULATED NOT CALCULATED  GFRAA >90  --    --  >90  --   --   --  NOT CALCULATED NOT CALCULATED  < > = values in this interval not displayed.  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 Anselm Pancoast today and procedure risks and benefits discussed extensively with the family today. Given risks of procedure with rapid decline in patient's overall status per family we have all decided to hold off on G-tube procedure and instead perform paracentesis with NGT placement today.  Patient with ascites today on ultrasound exam and CT reviewed with gastric varices and dilated colon near stomach, percutaneous approach may be difficult to find safe window to allow large enough tube for food decompression rather than just air decompression and this is what family prefers. The patient is also at high risk for peritonitis with ascites.   Thank you for this interesting consult.  I greatly enjoyed meeting Kristin Meadows and look forward to participating in their care.  SignedHedy Jacob 02/09/2015, 10:58 AM   I spent a total of 20 Minutes in face to face in clinical consultation, greater than 50% of which was counseling/coordinating care for malignant obstruction.

## 2015-02-09 NOTE — Procedures (Signed)
US guided paracentesis.  Removed 1 liter of dark yellow ascites. Fluoroscopy demonstrated small bowel obstruction containing recently ingested barium.  Placed 16 French NGT.  Tip in stomach region.

## 2015-02-10 ENCOUNTER — Telehealth: Payer: Self-pay | Admitting: *Deleted

## 2015-02-10 ENCOUNTER — Other Ambulatory Visit: Payer: Self-pay | Admitting: Internal Medicine

## 2015-02-10 ENCOUNTER — Telehealth: Payer: Self-pay

## 2015-02-10 DIAGNOSIS — R112 Nausea with vomiting, unspecified: Secondary | ICD-10-CM

## 2015-02-10 NOTE — Telephone Encounter (Signed)
Called Abigail Butts with Joylene John NP NPI needed for Hospice order for this patient.

## 2015-02-10 NOTE — Telephone Encounter (Signed)
Faxed signed order for 1000 ml of particial fills for dilaudid drip.  Dorna Leitz with Hospice states that the smaller volum of bags dispensed were only lasting a couple of days. Joylene John NP signed order for Dr. Marko Plume.  Copy sent to Him to be scanned into patient's EMR.

## 2015-02-16 ENCOUNTER — Telehealth: Payer: Self-pay | Admitting: Oncology

## 2015-02-16 NOTE — Telephone Encounter (Signed)
Medical Oncology  Confirmed with [facility] that patient died at home on March 14, 2015 at 34 pm. Letter written to family. Communication to other MDs involved.  Godfrey Pick, MD

## 2015-02-20 ENCOUNTER — Telehealth: Payer: Self-pay | Admitting: Oncology

## 2015-02-20 NOTE — Telephone Encounter (Signed)
Received death certificate 01/23/23

## 2015-02-23 ENCOUNTER — Telehealth: Payer: Self-pay | Admitting: Oncology

## 2015-02-23 NOTE — Telephone Encounter (Signed)
Mailed pt death certificate to [facility] 02/23/15

## 2015-02-26 ENCOUNTER — Encounter: Payer: Self-pay | Admitting: *Deleted

## 2015-02-26 NOTE — Progress Notes (Signed)
[place] Work  Clinical Social Work was referred by patient's husband for assistance with death certificate as he states this has not been completed. CSW received chart and let Ed know that certificate was mailed to funeral home on 02/23/15. He is aware and plans to follow up with them. He was very appreciative of support from [place] while pt was ill and was thankful of return call.   Loren Racer, [place] Worker [place]  [place] Phone: [phone] Fax: [phone]

## 2015-02-27 ENCOUNTER — Telehealth: Payer: Self-pay

## 2015-02-27 NOTE — Telephone Encounter (Signed)
Mailed signed orders dated 02-27-15 to [facility]. Sent a copy to HIM to be scanned into patient's EMR.

## 2015-03-02 ENCOUNTER — Telehealth: Payer: Self-pay | Admitting: Oncology

## 2015-03-02 NOTE — Telephone Encounter (Signed)
Received an email from HIM 02/24/15 reporting that patient passed away. Chart marked accordingly.

## 2015-03-13 DEATH — deceased

## 2015-08-04 IMAGING — CT CT ENTEROGRAPHY (ABD-PELV W/ CM)
2 of 6 series · 15 of 46 positions shown, 17 images · IV contrast (omnipaque)
Comparison: Radiographs 07/10/2014, MRI 06/26/2014 and CT
07/05/2014.

CLINICAL DATA: Anterior abdominal pain with nausea and vomiting.
History of right ovarian cancer status post omentectomy and tumor
debulking ten days ago. Small bowel obstruction.

EXAM:
CT ABDOMEN AND PELVIS WITH CONTRAST (ENTEROGRAPHY)
TECHNIQUE: Multidetector CT of the abdomen and pelvis during bolus
administration of intravenous contrast. Negative oral contrast
VoLumen was given.
CONTRAST:  100mL OMNIPAQUE IOHEXOL 300 MG/ML  SOLN

[Series 3: enterography thins 3's · axial · 0.71mm/px · z∈[-434,-26]mm · 12 of 156 slices shown, 14 images]
[im 10/156  soft-tissue]
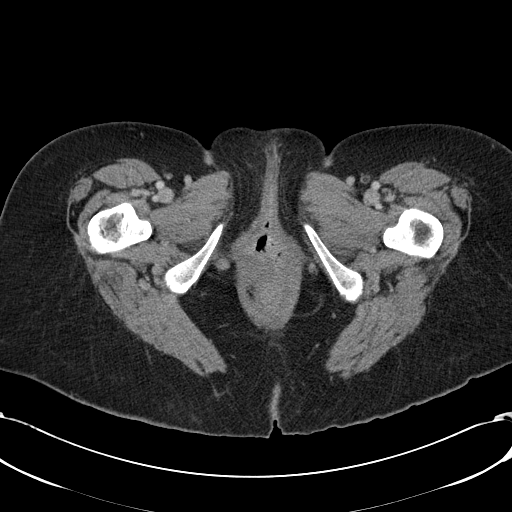
[im 10/156  bone]
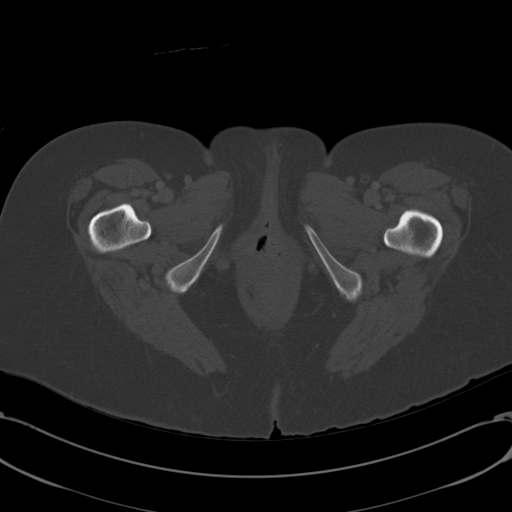
[im 20/156  soft-tissue]
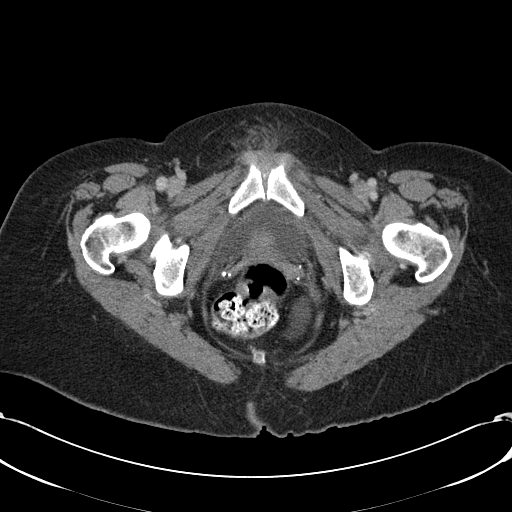
[im 39/156  soft-tissue]
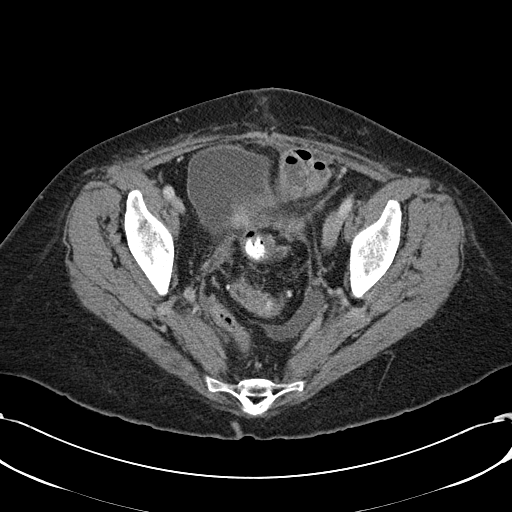
[im 49/156  soft-tissue]
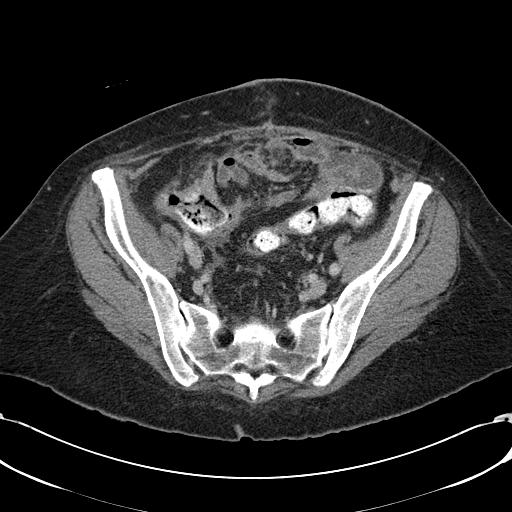
[im 59/156  soft-tissue]
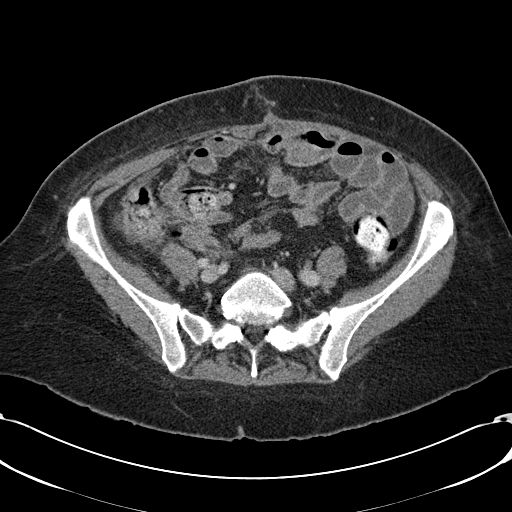
[im 68/156  soft-tissue]
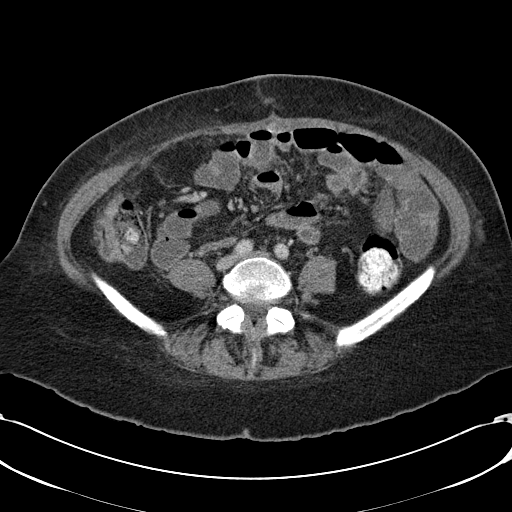
[im 88/156  soft-tissue]
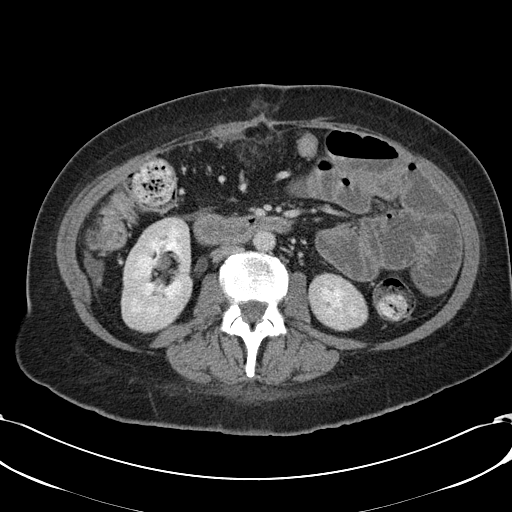
[im 97/156  soft-tissue]
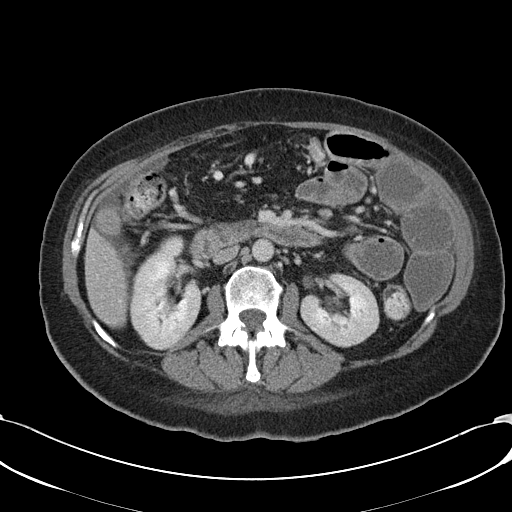
[im 107/156  soft-tissue]
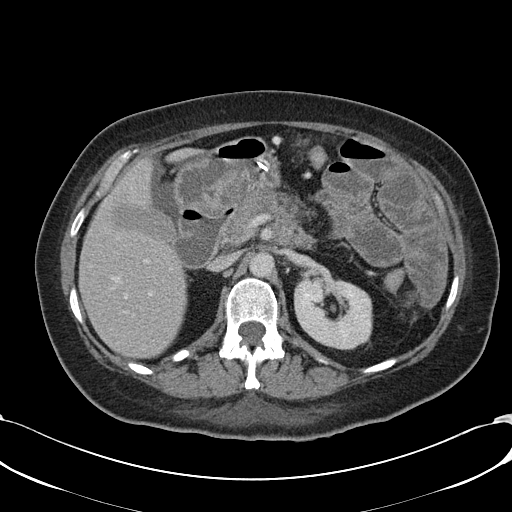
[im 107/156  bone]
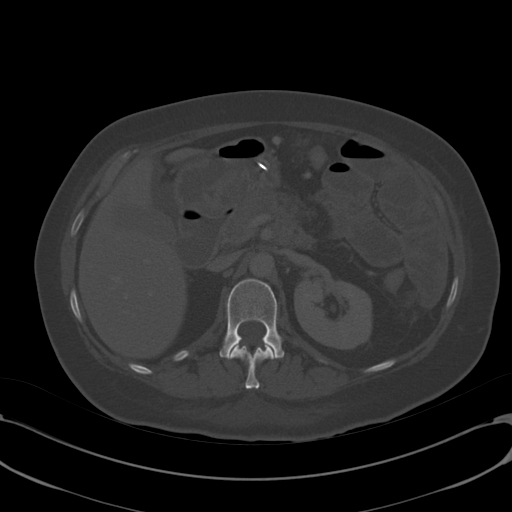
[im 117/156  soft-tissue]
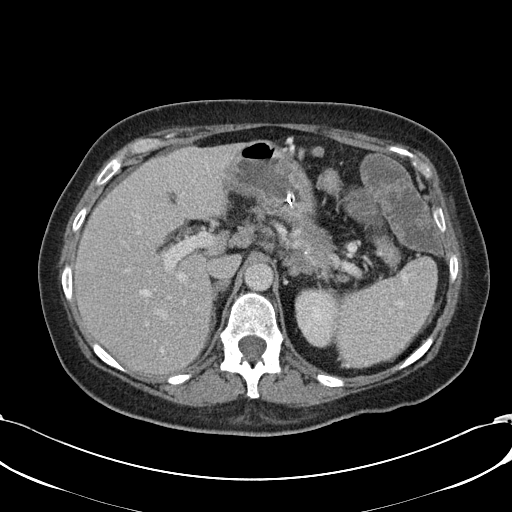
[im 136/156  soft-tissue]
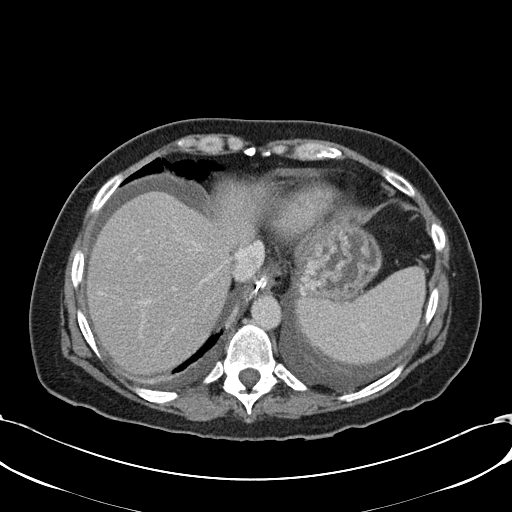
[im 146/156  soft-tissue]
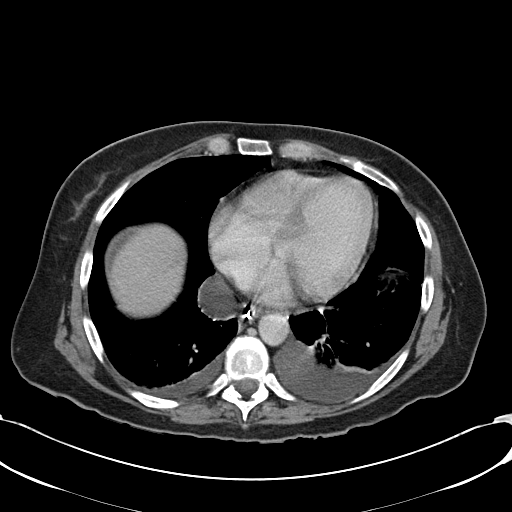

[Series 602: <mpr thick range> · coronal · 0.91mm/px · 3 of 84 slices shown]
[im 28/84  soft-tissue]
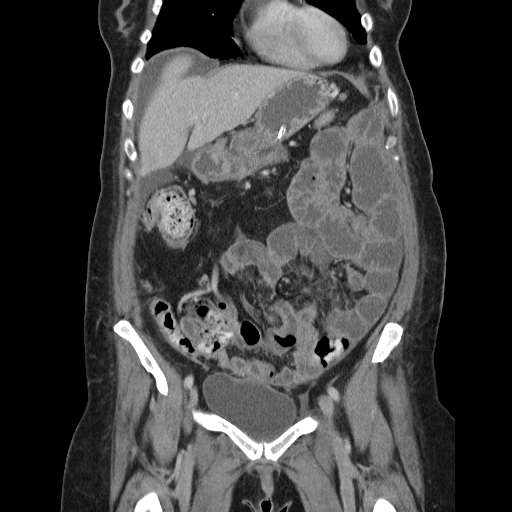
[im 37/84  soft-tissue]
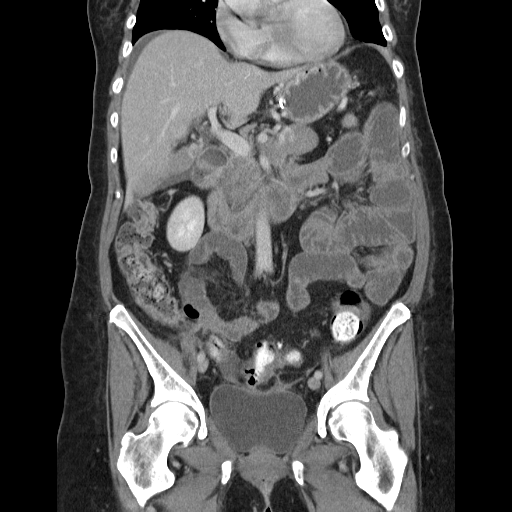
[im 47/84  soft-tissue]
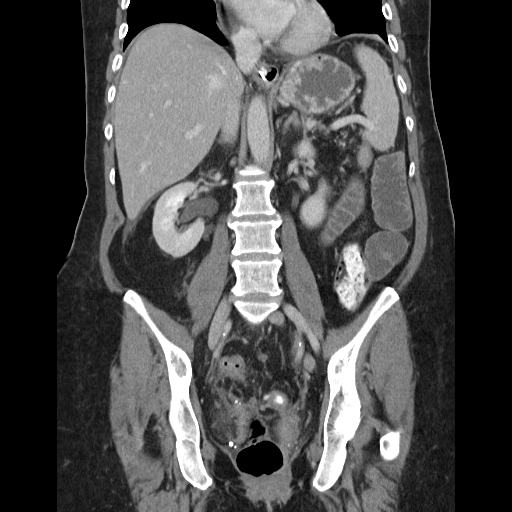

[15 of 46 positions shown; findings below may reference images not displayed]

FINDINGS: Lung bases: There are enlarging left-greater-than-right pleural
effusions with associated mild worsening of adjacent bibasilar
atelectasis. 3.3 cm fluid collection along the posterior right heart
border has slightly enlarged and is new from remote priors, probably
loculated pleural fluid.

Liver/Biliary/Pancreas: The liver appears unchanged with focal fat
adjacent to the falciform ligament. There is no suspicious liver
lesion. High-density bile within the gallbladder lumen appears
stable. No calcified gallstones or gallbladder wall thickening
identified. There is no biliary dilatation. There is persistent
ill-defined enlargement of the pancreatic body and tail with soft
tissue density extending posteriorly into the retroperitoneum. The
splenic vein is occluded. There are multiple splenic and gastric
varices. The portal and superior mesenteric veins are patent.

Spleen/Adrenal glands: There is stable chronic nodularity of the
left adrenal gland. The right adrenal gland and spleen appear
unremarkable.

Kidneys/Ureters/Bladder: Both kidneys appear normal without focal
lesion. There is interval development of mild
right-greater-than-left hydronephrosis without significantly delayed
contrast excretion. No obstructing ureteral calculus or adjacent
retroperitoneal mass identified. The bladder appears stable with
focal wall thickening along the right aspect of the bladder dome.

Bowel/Peritoneum: Nasogastric tube is in place. The stomach is
decompressed. The proximal small bowel is normal in caliber. There
is mild distention of the mid small bowel, not unexpected for an
enterography study. The distal small bowel and colon are
decompressed. No focal transition point identified. Ascites has not
significantly increased in volume. There is some omental nodularity
anteriorly, most notable on axial images 44-47. This is not clearly
seen on the most recent study and could reflect a thrombosed
varicosity.

Retroperitoneum/Pelvis: There are no enlarged abdominal or pelvic
lymph nodes. No other significant vascular findings are
demonstrated. There is no evidence of recurrent pelvic mass.

Abdominal wall: There are stable postsurgical changes within the
anterior abdominal wall. Skin staples have been removed. There is no
residual pneumoperitoneum.

Musculoskeletal: No acute or significant osseus findings.
IMPRESSION: 1. CT enterography study demonstrates nonspecific mid small bowel
distension without focal transition point to suggest high-grade
bowel obstruction.
2. Similar volume of ascites with resolution of postsurgical
pneumoperitoneum. Suggested nodularity anteriorly status post
omentectomy was not clearly seen on the most recent study and may
reflect a thrombosed varicosity.
3. Chronic splenic vein occlusion with multiple splenic and gastric
varices. This could be the sequela of pancreatitis as suggested on
MRI. A mass involving the retropancreatic retroperitoneum cannot be
excluded.
4. Enlarging bilateral pleural effusions with increased bibasilar
atelectasis.
5. New mild bilateral hydronephrosis, possibly related to bladder
distention. No high-grade ureteral obstruction. There is some
bladder wall thickening along the right aspect of the bladder dome.

## 2015-12-17 ENCOUNTER — Encounter: Payer: Self-pay | Admitting: Oncology

## 2015-12-17 NOTE — Progress Notes (Signed)
I will let [facility] know patient is deceased. They sent request for new auth for gemzar

## 2016-02-26 ENCOUNTER — Other Ambulatory Visit: Payer: Self-pay | Admitting: Nurse Practitioner
